# Patient Record
Sex: Female | Born: 1970
Health system: Southern US, Community
[De-identification: ages and names within clinical notes are randomized; demographics above are authoritative.]

## PROBLEM LIST (undated history)

## (undated) DIAGNOSIS — M1990 Unspecified osteoarthritis, unspecified site: Secondary | ICD-10-CM

## (undated) DIAGNOSIS — D219 Benign neoplasm of connective and other soft tissue, unspecified: Secondary | ICD-10-CM

## (undated) DIAGNOSIS — D649 Anemia, unspecified: Secondary | ICD-10-CM

## (undated) DIAGNOSIS — K219 Gastro-esophageal reflux disease without esophagitis: Secondary | ICD-10-CM

## (undated) DIAGNOSIS — R011 Cardiac murmur, unspecified: Secondary | ICD-10-CM

## (undated) HISTORY — DX: Benign neoplasm of connective and other soft tissue, unspecified: D21.9

## (undated) HISTORY — PX: BRONCHOSCOPY: SUR163

## (undated) HISTORY — PX: APPENDECTOMY: SHX54

## (undated) HISTORY — PX: KNEE SURGERY: SHX244

## (undated) HISTORY — DX: Unspecified osteoarthritis, unspecified site: M19.90

## (undated) HISTORY — PX: WISDOM TOOTH EXTRACTION: SHX21

## (undated) HISTORY — DX: Cardiac murmur, unspecified: R01.1

## (undated) HISTORY — PX: OTHER SURGICAL HISTORY: SHX169

## (undated) HISTORY — PX: TUBAL LIGATION: SHX77

## (undated) HISTORY — DX: Anemia, unspecified: D64.9

## (undated) HISTORY — DX: Gastro-esophageal reflux disease without esophagitis: K21.9

---

## 2001-06-22 ENCOUNTER — Encounter: Payer: Self-pay | Admitting: Family Medicine

## 2001-06-22 ENCOUNTER — Encounter: Admission: RE | Admit: 2001-06-22 | Discharge: 2001-06-22 | Payer: Self-pay | Admitting: Family Medicine

## 2001-08-19 ENCOUNTER — Ambulatory Visit (HOSPITAL_COMMUNITY): Admission: RE | Admit: 2001-08-19 | Discharge: 2001-08-19 | Payer: Self-pay | Admitting: Pulmonary Disease

## 2001-08-19 ENCOUNTER — Encounter: Payer: Self-pay | Admitting: Pulmonary Disease

## 2001-08-31 ENCOUNTER — Encounter (INDEPENDENT_AMBULATORY_CARE_PROVIDER_SITE_OTHER): Payer: Self-pay | Admitting: *Deleted

## 2001-08-31 ENCOUNTER — Encounter (INDEPENDENT_AMBULATORY_CARE_PROVIDER_SITE_OTHER): Payer: Self-pay | Admitting: Specialist

## 2001-08-31 ENCOUNTER — Ambulatory Visit: Admission: RE | Admit: 2001-08-31 | Discharge: 2001-08-31 | Payer: Self-pay | Admitting: Pulmonary Disease

## 2001-09-10 ENCOUNTER — Encounter: Payer: Self-pay | Admitting: Thoracic Surgery

## 2001-09-11 ENCOUNTER — Encounter (INDEPENDENT_AMBULATORY_CARE_PROVIDER_SITE_OTHER): Payer: Self-pay | Admitting: *Deleted

## 2001-09-11 ENCOUNTER — Ambulatory Visit (HOSPITAL_COMMUNITY): Admission: RE | Admit: 2001-09-11 | Discharge: 2001-09-11 | Payer: Self-pay | Admitting: Thoracic Surgery

## 2001-09-15 ENCOUNTER — Encounter: Admission: RE | Admit: 2001-09-15 | Discharge: 2001-09-15 | Payer: Self-pay | Admitting: Thoracic Surgery

## 2001-09-15 ENCOUNTER — Encounter: Payer: Self-pay | Admitting: Thoracic Surgery

## 2001-10-02 ENCOUNTER — Encounter: Payer: Self-pay | Admitting: Thoracic Surgery

## 2001-10-02 ENCOUNTER — Ambulatory Visit (HOSPITAL_COMMUNITY): Admission: RE | Admit: 2001-10-02 | Discharge: 2001-10-02 | Payer: Self-pay | Admitting: Thoracic Surgery

## 2001-10-05 ENCOUNTER — Ambulatory Visit: Admission: RE | Admit: 2001-10-05 | Discharge: 2001-10-05 | Payer: Self-pay | Admitting: Pulmonary Disease

## 2001-11-16 ENCOUNTER — Ambulatory Visit (HOSPITAL_COMMUNITY): Admission: RE | Admit: 2001-11-16 | Discharge: 2001-11-16 | Payer: Self-pay | Admitting: Thoracic Surgery

## 2001-11-16 ENCOUNTER — Encounter (INDEPENDENT_AMBULATORY_CARE_PROVIDER_SITE_OTHER): Payer: Self-pay | Admitting: *Deleted

## 2001-11-16 ENCOUNTER — Encounter: Payer: Self-pay | Admitting: Thoracic Surgery

## 2001-11-18 ENCOUNTER — Encounter: Payer: Self-pay | Admitting: Thoracic Surgery

## 2001-11-18 ENCOUNTER — Encounter: Admission: RE | Admit: 2001-11-18 | Discharge: 2001-11-18 | Payer: Self-pay | Admitting: Thoracic Surgery

## 2001-12-08 ENCOUNTER — Encounter: Admission: RE | Admit: 2001-12-08 | Discharge: 2001-12-08 | Payer: Self-pay | Admitting: Thoracic Surgery

## 2001-12-08 ENCOUNTER — Encounter: Payer: Self-pay | Admitting: Thoracic Surgery

## 2001-12-18 ENCOUNTER — Encounter: Payer: Self-pay | Admitting: Thoracic Surgery

## 2001-12-21 ENCOUNTER — Encounter (INDEPENDENT_AMBULATORY_CARE_PROVIDER_SITE_OTHER): Payer: Self-pay | Admitting: *Deleted

## 2001-12-21 ENCOUNTER — Ambulatory Visit (HOSPITAL_COMMUNITY): Admission: RE | Admit: 2001-12-21 | Discharge: 2001-12-21 | Payer: Self-pay | Admitting: Thoracic Surgery

## 2001-12-23 ENCOUNTER — Encounter: Payer: Self-pay | Admitting: Thoracic Surgery

## 2001-12-23 ENCOUNTER — Encounter: Admission: RE | Admit: 2001-12-23 | Discharge: 2001-12-23 | Payer: Self-pay | Admitting: Thoracic Surgery

## 2002-12-22 ENCOUNTER — Other Ambulatory Visit: Admission: RE | Admit: 2002-12-22 | Discharge: 2002-12-22 | Payer: Self-pay | Admitting: Internal Medicine

## 2003-08-25 ENCOUNTER — Encounter: Payer: Self-pay | Admitting: Obstetrics and Gynecology

## 2003-08-25 ENCOUNTER — Ambulatory Visit (HOSPITAL_COMMUNITY): Admission: RE | Admit: 2003-08-25 | Discharge: 2003-08-25 | Payer: Self-pay | Admitting: Obstetrics and Gynecology

## 2003-08-29 ENCOUNTER — Encounter: Payer: Self-pay | Admitting: Obstetrics and Gynecology

## 2003-08-29 ENCOUNTER — Ambulatory Visit (HOSPITAL_COMMUNITY): Admission: RE | Admit: 2003-08-29 | Discharge: 2003-08-29 | Payer: Self-pay | Admitting: Obstetrics and Gynecology

## 2003-09-19 ENCOUNTER — Encounter: Payer: Self-pay | Admitting: Obstetrics & Gynecology

## 2003-09-19 ENCOUNTER — Inpatient Hospital Stay (HOSPITAL_COMMUNITY): Admission: AD | Admit: 2003-09-19 | Discharge: 2003-09-19 | Payer: Self-pay | Admitting: Obstetrics & Gynecology

## 2003-09-22 ENCOUNTER — Ambulatory Visit (HOSPITAL_COMMUNITY): Admission: RE | Admit: 2003-09-22 | Discharge: 2003-09-22 | Payer: Self-pay | Admitting: Obstetrics and Gynecology

## 2003-09-22 ENCOUNTER — Encounter: Payer: Self-pay | Admitting: Obstetrics and Gynecology

## 2003-09-30 ENCOUNTER — Inpatient Hospital Stay (HOSPITAL_COMMUNITY): Admission: RE | Admit: 2003-09-30 | Discharge: 2003-10-03 | Payer: Self-pay | Admitting: Obstetrics and Gynecology

## 2003-09-30 ENCOUNTER — Encounter (INDEPENDENT_AMBULATORY_CARE_PROVIDER_SITE_OTHER): Payer: Self-pay

## 2003-12-27 ENCOUNTER — Other Ambulatory Visit: Admission: RE | Admit: 2003-12-27 | Discharge: 2003-12-27 | Payer: Self-pay | Admitting: Obstetrics and Gynecology

## 2005-01-24 ENCOUNTER — Other Ambulatory Visit: Admission: RE | Admit: 2005-01-24 | Discharge: 2005-01-24 | Payer: Self-pay | Admitting: Obstetrics and Gynecology

## 2005-08-02 ENCOUNTER — Ambulatory Visit: Payer: Self-pay | Admitting: Pulmonary Disease

## 2006-01-29 ENCOUNTER — Other Ambulatory Visit: Admission: RE | Admit: 2006-01-29 | Discharge: 2006-01-29 | Payer: Self-pay | Admitting: Obstetrics and Gynecology

## 2006-07-01 ENCOUNTER — Ambulatory Visit (HOSPITAL_COMMUNITY): Admission: RE | Admit: 2006-07-01 | Discharge: 2006-07-01 | Payer: Self-pay | Admitting: Obstetrics and Gynecology

## 2006-08-04 ENCOUNTER — Ambulatory Visit (HOSPITAL_COMMUNITY): Admission: RE | Admit: 2006-08-04 | Discharge: 2006-08-04 | Payer: Self-pay | Admitting: Obstetrics and Gynecology

## 2006-08-04 ENCOUNTER — Encounter (INDEPENDENT_AMBULATORY_CARE_PROVIDER_SITE_OTHER): Payer: Self-pay | Admitting: *Deleted

## 2006-09-30 ENCOUNTER — Ambulatory Visit (HOSPITAL_COMMUNITY): Admission: RE | Admit: 2006-09-30 | Discharge: 2006-09-30 | Payer: Self-pay | Admitting: Obstetrics and Gynecology

## 2007-05-22 ENCOUNTER — Ambulatory Visit: Payer: Self-pay | Admitting: Pulmonary Disease

## 2007-06-19 ENCOUNTER — Ambulatory Visit: Payer: Self-pay | Admitting: Pulmonary Disease

## 2007-07-10 ENCOUNTER — Ambulatory Visit: Payer: Self-pay | Admitting: Pulmonary Disease

## 2007-07-13 ENCOUNTER — Ambulatory Visit: Payer: Self-pay | Admitting: Pulmonary Disease

## 2007-10-16 DIAGNOSIS — J209 Acute bronchitis, unspecified: Secondary | ICD-10-CM | POA: Insufficient documentation

## 2007-10-16 DIAGNOSIS — J4540 Moderate persistent asthma, uncomplicated: Secondary | ICD-10-CM | POA: Insufficient documentation

## 2008-09-05 ENCOUNTER — Ambulatory Visit: Payer: Self-pay | Admitting: Internal Medicine

## 2008-09-05 LAB — CONVERTED CEMR LAB
ALT: 14 units/L (ref 0–35)
AST: 25 units/L (ref 0–37)
Albumin: 3.2 g/dL — ABNORMAL LOW (ref 3.5–5.2)
Alkaline Phosphatase: 67 units/L (ref 39–117)
BUN: 12 mg/dL (ref 6–23)
Basophils Absolute: 0 10*3/uL (ref 0.0–0.1)
Basophils Relative: 0 % (ref 0.0–3.0)
Bilirubin Urine: NEGATIVE
Bilirubin, Direct: 0.3 mg/dL (ref 0.0–0.3)
CO2: 29 meq/L (ref 19–32)
Calcium: 8.6 mg/dL (ref 8.4–10.5)
Chloride: 112 meq/L (ref 96–112)
Cholesterol: 121 mg/dL (ref 0–200)
Creatinine, Ser: 0.7 mg/dL (ref 0.4–1.2)
Eosinophils Absolute: 0.1 10*3/uL (ref 0.0–0.7)
Eosinophils Relative: 1.9 % (ref 0.0–5.0)
GFR calc Af Amer: 121 mL/min
GFR calc non Af Amer: 100 mL/min
Glucose, Bld: 94 mg/dL (ref 70–99)
HCT: 25.9 % — ABNORMAL LOW (ref 36.0–46.0)
HDL: 26.8 mg/dL — ABNORMAL LOW (ref >39.0)
Hemoglobin, Urine: NEGATIVE
Hemoglobin: 8 g/dL — ABNORMAL LOW (ref 12.0–15.0)
Ketones, ur: NEGATIVE mg/dL
LDL Cholesterol: 54 mg/dL (ref 0–99)
Leukocytes, UA: NEGATIVE
Lymphocytes Relative: 35.8 % (ref 12.0–46.0)
MCHC: 30.8 g/dL (ref 30.0–36.0)
MCV: 65.8 fL — ABNORMAL LOW (ref 78.0–100.0)
Monocytes Absolute: 1.1 10*3/uL — ABNORMAL HIGH (ref 0.1–1.0)
Monocytes Relative: 14.2 % — ABNORMAL HIGH (ref 3.0–12.0)
Neutro Abs: 3.7 10*3/uL (ref 1.4–7.7)
Neutrophils Relative %: 48.1 % (ref 43.0–77.0)
Nitrite: NEGATIVE
Platelets: 557 10*3/uL — ABNORMAL HIGH (ref 150–400)
Potassium: 5 meq/L (ref 3.5–5.1)
RBC: 3.94 M/uL (ref 3.87–5.11)
RDW: 19.7 % — ABNORMAL HIGH (ref 11.5–14.6)
Sodium: 143 meq/L (ref 135–145)
Specific Gravity, Urine: 1.015 (ref 1.000–1.03)
TSH: 0.98 microintl units/mL (ref 0.35–5.50)
Total Bilirubin: 0.7 mg/dL (ref 0.3–1.2)
Total CHOL/HDL Ratio: 4.5
Total Protein, Urine: NEGATIVE mg/dL
Total Protein: 6.9 g/dL (ref 6.0–8.3)
Triglycerides: 200 mg/dL — ABNORMAL HIGH (ref 0–149)
Urine Glucose: NEGATIVE mg/dL
Urobilinogen, UA: 0.2 (ref 0.0–1.0)
VLDL: 40 mg/dL (ref 0–40)
WBC: 7.6 10*3/uL (ref 4.5–10.5)
pH: 6.5 (ref 5.0–8.0)

## 2008-09-08 ENCOUNTER — Encounter: Payer: Self-pay | Admitting: Internal Medicine

## 2008-09-08 ENCOUNTER — Ambulatory Visit: Payer: Self-pay | Admitting: Internal Medicine

## 2008-09-08 DIAGNOSIS — M25569 Pain in unspecified knee: Secondary | ICD-10-CM | POA: Insufficient documentation

## 2008-09-08 DIAGNOSIS — D509 Iron deficiency anemia, unspecified: Secondary | ICD-10-CM | POA: Insufficient documentation

## 2008-09-08 DIAGNOSIS — K219 Gastro-esophageal reflux disease without esophagitis: Secondary | ICD-10-CM | POA: Insufficient documentation

## 2008-09-08 DIAGNOSIS — R011 Cardiac murmur, unspecified: Secondary | ICD-10-CM | POA: Insufficient documentation

## 2008-09-08 DIAGNOSIS — D259 Leiomyoma of uterus, unspecified: Secondary | ICD-10-CM | POA: Insufficient documentation

## 2008-09-08 DIAGNOSIS — D500 Iron deficiency anemia secondary to blood loss (chronic): Secondary | ICD-10-CM | POA: Insufficient documentation

## 2008-09-13 ENCOUNTER — Telehealth: Payer: Self-pay | Admitting: Internal Medicine

## 2008-09-13 ENCOUNTER — Ambulatory Visit: Payer: Self-pay | Admitting: Internal Medicine

## 2008-09-14 ENCOUNTER — Encounter: Admission: RE | Admit: 2008-09-14 | Discharge: 2008-10-24 | Payer: Self-pay | Admitting: Internal Medicine

## 2008-09-21 ENCOUNTER — Telehealth: Payer: Self-pay | Admitting: Internal Medicine

## 2008-10-13 ENCOUNTER — Ambulatory Visit: Payer: Self-pay | Admitting: Internal Medicine

## 2008-10-13 LAB — CONVERTED CEMR LAB
Basophils Absolute: 0 10*3/uL (ref 0.0–0.1)
Basophils Relative: 0.2 % (ref 0.0–3.0)
Eosinophils Absolute: 0.2 10*3/uL (ref 0.0–0.7)
Eosinophils Relative: 2.1 % (ref 0.0–5.0)
Folate: 11.6 ng/mL
HCT: 30 % — ABNORMAL LOW (ref 36.0–46.0)
Hemoglobin: 9.8 g/dL — ABNORMAL LOW (ref 12.0–15.0)
Iron: 21 ug/dL — ABNORMAL LOW (ref 42–145)
Lymphocytes Relative: 32.4 % (ref 12.0–46.0)
MCHC: 32.6 g/dL (ref 30.0–36.0)
MCV: 68.6 fL — ABNORMAL LOW (ref 78.0–100.0)
Monocytes Absolute: 0.5 10*3/uL (ref 0.1–1.0)
Monocytes Relative: 5 % (ref 3.0–12.0)
Neutro Abs: 5.9 10*3/uL (ref 1.4–7.7)
Neutrophils Relative %: 60.3 % (ref 43.0–77.0)
Platelets: 424 10*3/uL — ABNORMAL HIGH (ref 150–400)
RBC: 4.38 M/uL (ref 3.87–5.11)
RDW: 23.4 % — ABNORMAL HIGH (ref 11.5–14.6)
Vitamin B-12: 141 pg/mL — ABNORMAL LOW (ref 211–911)
WBC: 9.7 10*3/uL (ref 4.5–10.5)

## 2008-10-14 ENCOUNTER — Encounter: Payer: Self-pay | Admitting: Internal Medicine

## 2008-10-26 ENCOUNTER — Ambulatory Visit: Payer: Self-pay | Admitting: Internal Medicine

## 2008-10-26 DIAGNOSIS — D510 Vitamin B12 deficiency anemia due to intrinsic factor deficiency: Secondary | ICD-10-CM | POA: Insufficient documentation

## 2008-10-26 DIAGNOSIS — H6590 Unspecified nonsuppurative otitis media, unspecified ear: Secondary | ICD-10-CM | POA: Insufficient documentation

## 2009-02-23 ENCOUNTER — Ambulatory Visit: Payer: Self-pay | Admitting: Internal Medicine

## 2009-02-23 DIAGNOSIS — M722 Plantar fascial fibromatosis: Secondary | ICD-10-CM | POA: Insufficient documentation

## 2009-03-15 ENCOUNTER — Telehealth: Payer: Self-pay | Admitting: Internal Medicine

## 2009-04-03 ENCOUNTER — Telehealth: Payer: Self-pay | Admitting: Internal Medicine

## 2009-05-16 ENCOUNTER — Telehealth: Payer: Self-pay | Admitting: Internal Medicine

## 2009-08-11 ENCOUNTER — Telehealth: Payer: Self-pay | Admitting: Internal Medicine

## 2009-08-15 ENCOUNTER — Telehealth: Payer: Self-pay | Admitting: Internal Medicine

## 2009-09-14 ENCOUNTER — Ambulatory Visit: Payer: Self-pay | Admitting: Internal Medicine

## 2009-09-14 DIAGNOSIS — H918X9 Other specified hearing loss, unspecified ear: Secondary | ICD-10-CM | POA: Insufficient documentation

## 2009-09-18 ENCOUNTER — Encounter: Payer: Self-pay | Admitting: Internal Medicine

## 2009-09-18 ENCOUNTER — Telehealth: Payer: Self-pay | Admitting: Internal Medicine

## 2009-09-25 ENCOUNTER — Telehealth: Payer: Self-pay | Admitting: Internal Medicine

## 2009-10-12 ENCOUNTER — Telehealth: Payer: Self-pay | Admitting: Internal Medicine

## 2010-01-24 LAB — CONVERTED CEMR LAB: Pap Smear: NORMAL

## 2010-02-06 ENCOUNTER — Ambulatory Visit (HOSPITAL_COMMUNITY): Admission: RE | Admit: 2010-02-06 | Discharge: 2010-02-06 | Payer: Self-pay | Admitting: Obstetrics and Gynecology

## 2011-01-04 ENCOUNTER — Encounter: Payer: Self-pay | Admitting: Internal Medicine

## 2011-01-04 ENCOUNTER — Other Ambulatory Visit: Payer: Self-pay | Admitting: Internal Medicine

## 2011-01-04 ENCOUNTER — Telehealth: Payer: Self-pay | Admitting: Internal Medicine

## 2011-01-04 ENCOUNTER — Ambulatory Visit
Admission: RE | Admit: 2011-01-04 | Discharge: 2011-01-04 | Payer: Self-pay | Source: Home / Self Care | Attending: Internal Medicine | Admitting: Internal Medicine

## 2011-01-04 DIAGNOSIS — E538 Deficiency of other specified B group vitamins: Secondary | ICD-10-CM | POA: Insufficient documentation

## 2011-01-04 LAB — LIPID PANEL
Cholesterol: 156 mg/dL (ref 0–200)
HDL: 36.4 mg/dL — ABNORMAL LOW (ref >39.00)
LDL Cholesterol: 109 mg/dL — ABNORMAL HIGH (ref 0–99)
Total CHOL/HDL Ratio: 4
Triglycerides: 51 mg/dL (ref 0.0–149.0)
VLDL: 10.2 mg/dL (ref 0.0–40.0)

## 2011-01-04 LAB — BASIC METABOLIC PANEL
BUN: 12 mg/dL (ref 6–23)
CO2: 29 mEq/L (ref 19–32)
Calcium: 9.3 mg/dL (ref 8.4–10.5)
Chloride: 103 mEq/L (ref 96–112)
Creatinine, Ser: 0.6 mg/dL (ref 0.4–1.2)
GFR: 137.18 mL/min (ref >60.00)
Glucose, Bld: 92 mg/dL (ref 70–99)
Potassium: 4.7 mEq/L (ref 3.5–5.1)
Sodium: 138 mEq/L (ref 135–145)

## 2011-01-04 LAB — IBC PANEL
Iron: 41 ug/dL — ABNORMAL LOW (ref 42–145)
Saturation Ratios: 12.2 % — ABNORMAL LOW (ref 20.0–50.0)
Transferrin: 240.2 mg/dL (ref 212.0–360.0)

## 2011-01-04 LAB — CBC WITH DIFFERENTIAL/PLATELET
Basophils Absolute: 0 10*3/uL (ref 0.0–0.1)
Basophils Relative: 0.5 % (ref 0.0–3.0)
Eosinophils Absolute: 0.1 10*3/uL (ref 0.0–0.7)
Eosinophils Relative: 1.1 % (ref 0.0–5.0)
HCT: 34.5 % — ABNORMAL LOW (ref 36.0–46.0)
Hemoglobin: 11.4 g/dL — ABNORMAL LOW (ref 12.0–15.0)
Lymphocytes Relative: 26.6 % (ref 12.0–46.0)
Lymphs Abs: 2.3 10*3/uL (ref 0.7–4.0)
MCHC: 33 g/dL (ref 30.0–36.0)
MCV: 83.8 fl (ref 78.0–100.0)
Monocytes Absolute: 0.4 10*3/uL (ref 0.1–1.0)
Monocytes Relative: 4.2 % (ref 3.0–12.0)
Neutro Abs: 5.9 10*3/uL (ref 1.4–7.7)
Neutrophils Relative %: 67.6 % (ref 43.0–77.0)
Platelets: 461 10*3/uL — ABNORMAL HIGH (ref 150.0–400.0)
RBC: 4.11 Mil/uL (ref 3.87–5.11)
RDW: 16.4 % — ABNORMAL HIGH (ref 11.5–14.6)
WBC: 8.7 10*3/uL (ref 4.5–10.5)

## 2011-01-04 LAB — B12 AND FOLATE PANEL
Folate: 14.9 ng/mL (ref >5.9)
Vitamin B-12: 75 pg/mL — ABNORMAL LOW (ref 211–911)

## 2011-01-04 LAB — TSH: TSH: 0.97 u[IU]/mL (ref 0.35–5.50)

## 2011-01-06 LAB — CONVERTED CEMR LAB
Basophils Absolute: 0 10*3/uL (ref 0.0–0.1)
Basophils Relative: 0.2 % (ref 0.0–3.0)
Eosinophils Absolute: 0.1 10*3/uL (ref 0.0–0.7)
Eosinophils Relative: 1.8 % (ref 0.0–5.0)
Folate: 15.3 ng/mL
HCT: 27.3 % — ABNORMAL LOW (ref 36.0–46.0)
Hemoglobin: 8.6 g/dL — ABNORMAL LOW (ref 12.0–15.0)
Iron: 16 ug/dL — ABNORMAL LOW (ref 42–145)
Lymphocytes Relative: 26.2 % (ref 12.0–46.0)
MCHC: 31.4 g/dL (ref 30.0–36.0)
MCV: 64.1 fL — ABNORMAL LOW (ref 78.0–100.0)
Monocytes Absolute: 0.2 10*3/uL (ref 0.1–1.0)
Monocytes Relative: 3.2 % (ref 3.0–12.0)
Neutro Abs: 5.1 10*3/uL (ref 1.4–7.7)
Neutrophils Relative %: 68.6 % (ref 43.0–77.0)
Platelets: 603 10*3/uL — ABNORMAL HIGH (ref 150–400)
RBC: 4.25 M/uL (ref 3.87–5.11)
RDW: 19.3 % — ABNORMAL HIGH (ref 11.5–14.6)
Saturation Ratios: 4.1 % — ABNORMAL LOW (ref 20.0–50.0)
Transferrin: 280.7 mg/dL (ref >=212.0)
Vitamin B-12: 70 pg/mL — ABNORMAL LOW (ref 211–911)
WBC: 7.3 10*3/uL (ref 4.5–10.5)

## 2011-01-07 ENCOUNTER — Telehealth: Payer: Self-pay | Admitting: Internal Medicine

## 2011-01-08 NOTE — Progress Notes (Signed)
Summary: results  Phone Note Call from Patient Call back at [phone]   Caller: Patient Summary of Call: Patient called inquiring about results of hearing test that she had faxed over last week? Initial call taken by: Rock Nephew CMA,  September 25, 2009 1:31 PM  Follow-up for Phone Call        i don't think i have seen it. she should call whomever did the test. Follow-up by: Etta Grandchild MD,  September 25, 2009 1:35 PM  Additional Follow-up for Phone Call Additional follow up Details #1::        Pt did not get hearing test [facility], she said her work did the test and faxed results. She will request that it is refaxed.  Additional Follow-up by: Lamar Sprinkles, CMA,  September 25, 2009 4:55 PM

## 2011-01-08 NOTE — Progress Notes (Signed)
Summary: Labs  Phone Note Call from Patient Call back at Home Phone [phone] Call back at [phone]   Summary of Call: Pt has an apt Nov 4th for f/u does she need labs prior?  Initial call taken by: Lamar Sprinkles,  September 21, 2008 4:21 PM  Follow-up for Phone Call        yes   CBC, Iron, B12, folate Follow-up by: Etta Grandchild MD,  September 22, 2008 8:07 AM  Additional Follow-up for Phone Call Additional follow up Details #1::        Pt informed of labs. Pt c/o right ear feeling stuffy. She recenlty has had a "cold" and was taking a decongestant. Nurse at pt's wk told pt her ear was red. Pt will try a decongestant and call office if any pain or fever.  Additional Follow-up by: Lamar Sprinkles,  September 23, 2008 9:53 AM

## 2011-01-08 NOTE — Progress Notes (Signed)
Summary: PT?  Phone Note Call from Patient   Caller: Patient [phone] Summary of Call: pt called inquiring about results of hearing test. pt informed that results have not been received. Initial call taken by: Margaret Pyle, CMA,  September 18, 2009 2:35 PM

## 2011-01-08 NOTE — Progress Notes (Signed)
Summary: B12 Injection  ---- Converted from flag ---- ---- 09/08/2008 2:18 PM, Etta Grandchild MD wrote: Gail Payne, this patient is anemic from iron deficiency and B12 deficiency. Please call her and ask her to come in soon for a B12 shot. ------------------------------  Phone Note Call from Patient   Summary of Call: Need to call pt Initial call taken by: Lamar Sprinkles,  September 13, 2008 7:58 AM  Follow-up for Phone Call        Pt informed, coming in today Follow-up by: Lamar Sprinkles,  September 13, 2008 11:12 AM

## 2011-01-08 NOTE — Assessment & Plan Note (Signed)
Summary: HEARING DIFFICULTY IN R EAR--L ARM NUMBNESS--#--STC---RS'D, 3...   Vital Signs:  Patient profile:   39 year old female Height:      69 inches Weight:      262 pounds BMI:     38.83 O2 Sat:      97 % on Room air Temp:     98.7 degrees F oral Pulse rate:   76 / minute Pulse rhythm:   regular BP sitting:   118 / 74  (left arm) Cuff size:   large  Vitals Entered By: Rock Nephew CMA (September 14, 2009 2:55 PM)  Nutrition Counseling: Patient's BMI is greater than 25 and therefore counseled on weight management options.  O2 Flow:  Room air  Primary Care Provider:  Etta Grandchild MD   History of Present Illness: She returns complaining that she has gradually noticed  a decreased level of hearing in her rght ear for several months.  Also, she ran out of of Adviar Diskus several months ago and has been using her ProAir inhaler up to 5 times per day.  Asthma History    Initial Asthma Severity Rating:    Age range: 12+ years    Symptoms: daily    Nighttime Awakenings: 0-2/month    Interferes w/ normal activity: no limitations    SABA use (not for EIB): 0-2 days/week    Asthma Severity Assessment: Moderate Persistent    Preventive Screening-Counseling & Management  Alcohol-Tobacco     Alcohol drinks/day: <1     Smoking Status: never     Passive Smoke Exposure: no  Current Medications (verified): 1)  Ferrous Sulfate 325 (65 Fe) Mg Tabs (Ferrous Sulfate) .... Three Times A Day With Food 2)  Naprosyn 375 Mg Tabs (Naproxen) .... One By Mouth Two Times A Day As Needed For Foot Pain 3)  Proair Hfa 108 (90 Base) Mcg/act Aers (Albuterol Sulfate) .Marland Kitchen.. 1-2 Puffs Qid As Needed Wheezing 4)  Advair Diskus 250-50 Mcg/dose Misc (Fluticasone-Salmeterol) .... One Puff Two Times A Day For Asthma  Allergies (verified): No Known Drug Allergies  Past History:  Past Medical History: Reviewed history from 09/08/2008 and no changes required. Anemia-iron deficiency Asthma GERD  heart murmur discovered during labor and delivery 5 years ago Uterine fibroids History of mucous plugs in her lungs requiring bronchoscopy  Past Surgical History: Reviewed history from 09/08/2008 and no changes required. Appendectomy Tubal ligation  Family History: Reviewed history from 09/08/2008 and no changes required. Family History of Arthritis Family History Diabetes 1st degree relative Family History High cholesterol Family History Hypertension  Social History: Reviewed history from 09/08/2008 and no changes required. Married Never Smoked Alcohol use-no Drug use-no Regular exercise-no  Review of Systems  The patient denies anorexia, fever, chest pain, dyspnea on exertion, peripheral edema, prolonged cough, abdominal pain, difficulty walking, depression, abnormal bleeding, and enlarged lymph nodes.   General:  Denies chills, fever, loss of appetite, and malaise. ENT:  Complains of decreased hearing; denies difficulty swallowing, ear discharge, earache, hoarseness, nasal congestion, nosebleeds, postnasal drainage, ringing in ears, sinus pressure, and sore throat. Resp:  Complains of shortness of breath and wheezing; denies chest pain with inspiration, cough, coughing up blood, pleuritic, and sputum productive.  Physical Exam  General:  alert, well-developed, well-nourished, well-hydrated, healthy-appearing, cooperative to examination, good hygiene, and overweight-appearing.   Head:  normocephalic and atraumatic.   Ears:  R ear normal and L ear normal.   Nose:  External nasal examination shows no deformity or inflammation.  Nasal mucosa are pink and moist without lesions or exudates. Mouth:  Oral mucosa and oropharynx without lesions or exudates.  Teeth in good repair. Neck:  supple, full ROM, no masses, no carotid bruits, no cervical lymphadenopathy, and no neck tenderness.   Lungs:  Normal respiratory effort, chest expands symmetrically. Lungs are clear to auscultation,  no crackles or wheezes. Heart:  Normal rate and regular rhythm. S1 and S2 normal without gallop, murmur, click, rub or other extra sounds. Abdomen:  soft, non-tender, normal bowel sounds, no distention, no masses, no guarding, no hepatomegaly, and no splenomegaly.   Msk:  normal ROM, no joint tenderness, no joint swelling, no joint warmth, and no redness over joints.   Pulses:  R and L carotid,radial,femoral,dorsalis pedis and posterior tibial pulses are full and equal bilaterally Extremities:  No clubbing, cyanosis, edema, or deformity noted with normal full range of motion of all joints.   Neurologic:  No cranial nerve deficits noted. Station and gait are normal. Plantar reflexes are down-going bilaterally. DTRs are symmetrical throughout. Sensory, motor and coordinative functions appear intact. Skin:  turgor normal, color normal, no rashes, no suspicious lesions, and no ecchymoses.   Cervical Nodes:  No lymphadenopathy noted Psych:  Cognition and judgment appear intact. Alert and cooperative with normal attention span and concentration. No apparent delusions, illusions, hallucinations   Impression & Recommendations:  Problem # 1:  OTHER SPECIFIED FORMS OF HEARING LOSS (ICD-389.8) Assessment New  Orders: Audiology (Audio)  Problem # 2:  ASTHMA (ICD-493.90) Assessment: Deteriorated  The following medications were removed from the medication list:    Advair Diskus 250-50 Mcg/dose Misc (Fluticasone-salmeterol) ..... One puff two times a day for asthma Her updated medication list for this problem includes:    Proair Hfa 108 (90 Base) Mcg/act Aers (Albuterol sulfate) .Marland Kitchen... 1-2 puffs qid as needed wheezing    Qvar 40 Mcg/act Aers (Beclomethasone dipropionate) ..... One puff bid  Complete Medication List: 1)  Ferrous Sulfate 325 (65 Fe) Mg Tabs (Ferrous sulfate) .... Three times a day with food 2)  Naprosyn 375 Mg Tabs (Naproxen) .... One by mouth two times a day as needed for foot pain 3)   Proair Hfa 108 (90 Base) Mcg/act Aers (Albuterol sulfate) .Marland Kitchen.. 1-2 puffs qid as needed wheezing 4)  Qvar 40 Mcg/act Aers (Beclomethasone dipropionate) .... One puff bid  Patient Instructions: 1)  It is important to use your inhaler properly. Use a spacer, take slow deep breaths and hold them. Rinse your mouth after using.  2)  Measure your Peak Flow regularly. If it is below: call our office. Prescriptions: QVAR 40 MCG/ACT AERS (BECLOMETHASONE DIPROPIONATE) One puff BID  #6 inhs x 0   Entered and Authorized by:   Etta Grandchild MD   Signed by:   Etta Grandchild MD on 09/14/2009   Method used:   Samples Given   RxID:   [id number]

## 2011-01-08 NOTE — Assessment & Plan Note (Signed)
Summary: FU---MAR APPT PER PT--$50--STC   Vital Signs:  Patient profile:   40 year old female Height:      69 inches Weight:      263 pounds O2 Sat:      95 % Pulse rate:   80 / minute Pulse rhythm:   regular BP sitting:   118 / 64  (right arm) Cuff size:   large  Vitals Entered By: Rock Nephew CMA (February 23, 2009 3:42 PM) Pain Assessment Patient in pain? yes     Location: Arm and Heel   Primary Care Provider:  Etta Grandchild MD   History of Present Illness: Pt returns c/o pain in both heels for 3 months, no trauma, left>right. Hurrts more in AM and when she walks around barefooted.  She saw her Gyn. last week and B12 was normal but Iron-deficiency/anemia persists and gyn. is looking at fibroids and heavy periods.  Preventive Screening-Counseling & Management     Alcohol drinks/day: <1  Current Medications (verified): 1)  Ferrous Sulfate 325 (65 Fe) Mg Tabs (Ferrous Sulfate) .... Three Times A Day With Food  Allergies (verified): No Known Drug Allergies  Past History:  Family History:    Family History of Arthritis    Family History Diabetes 1st degree relative    Family History High cholesterol    Family History Hypertension     (09/08/2008)  Risk Factors:    Alcohol Use: N/A    >5 drinks/d w/in last 3 months: N/A    Caffeine Use: N/A    Diet: N/A    Exercise: no (09/08/2008)  Past medical, surgical, family and social histories (including risk factors) reviewed, and no changes noted (except as noted below).  Past Medical History:    Reviewed history from 09/08/2008 and no changes required:    Anemia-iron deficiency    Asthma    GERD    heart murmur discovered during labor and delivery 5 years ago    Uterine fibroids    History of mucous plugs in her lungs requiring bronchoscopy  Past Surgical History:    Reviewed history from 09/08/2008 and no changes required:    Appendectomy    Tubal ligation  Family History:    Reviewed history from  09/08/2008 and no changes required:       Family History of Arthritis       Family History Diabetes 1st degree relative       Family History High cholesterol       Family History Hypertension  Social History:    Reviewed history from 09/08/2008 and no changes required:       Married       Never Smoked       Alcohol use-no       Drug use-no       Regular exercise-no  Review of Systems       The patient complains of weight gain.  The patient denies anorexia, fever, weight loss, hemoptysis, abdominal pain, melena, hematochezia, severe indigestion/heartburn, hematuria, abnormal bleeding, enlarged lymph nodes, and angioedema.    Physical Exam  General:  alert, well-developed, well-nourished, well-hydrated, and overweight-appearing.   Neck:  supple, full ROM, and no masses.   Lungs:  Normal respiratory effort, chest expands symmetrically. Lungs are clear to auscultation, no crackles or wheezes. Heart:  Normal rate and regular rhythm. S1 and S2 normal without gallop, murmur, click, rub or other extra sounds. Abdomen:  soft and non-tender.     Foot/Ankle Exam  General:    obese.    Gait:    Normal heel-toe gait pattern bilaterally.    Skin:    Intact with no scars, lesions, rashes, cafe-au-lait spots or bruising.    Inspection:    Inspection reveals no deformity, ecchymosis or swelling.   Palpation:    non-tender to palpation over distal leg, medial and lateral ankle, distal achilles tendon, medial and lateral hindfoot, posterior heel, plantar heel, midfoot and forefoot bilaterally.   Vascular:    dorsalis pedis and posterior tibial pulses 2+ and symmetric, capillary refill < 2 seconds, normal hair pattern, no evidence of ischemia.   Foot Exam:    Right:    Inspection:  Normal    Palpation:  Normal    Stability:  stable    Tenderness:  no    Swelling:  no    Erythema:  no    Range of Motion:       Hallux MTP Dorsiflex-Active: 60       Hallux MTP Plantar Flex-Active:  45       Hallux IP-Active: full       Hallux MTP Dorsiflex-Passive: 60       Hallux MTP Plantar Flex-Passive: 45       Hallux IP-Passive: full    Left:    Inspection:  Normal    Palpation:  Normal    Stability:  stable    Tenderness:  no    Swelling:  no    Erythema:  no    Range of Motion:       Hallux MTP Dorsiflex-Active: 60       Hallux MTP Plantar Flex-Active: 45       Hallux IP-Active: full       Hallux MTP Dorsiflex-Passive: 60       Hallux MTP Plantar Flex-Passive: 45       Hallux IP-Passive: full   Impression & Recommendations:  Problem # 1:  FASCIITIS, PLANTAR (ICD-728.71)  Her updated medication list for this problem includes:    Naprosyn 375 Mg Tabs (Naproxen) ..... One by mouth two times a day as needed for foot pain  Orders: Physical Therapy Referral (PT)  Problem # 2:  ANEMIA, PERNICIOUS (ICD-281.0) managed by her Gyn MD, continue to f/up there Her updated medication list for this problem includes:    Ferrous Sulfate 325 (65 Fe) Mg Tabs (Ferrous sulfate) .Marland Kitchen... Three times a day with food  Complete Medication List: 1)  Ferrous Sulfate 325 (65 Fe) Mg Tabs (Ferrous sulfate) .... Three times a day with food 2)  Naprosyn 375 Mg Tabs (Naproxen) .... One by mouth two times a day as needed for foot pain  Patient Instructions: 1)  Please schedule a follow-up appointment in 1 month. 2)  It is important that you exercise regularly at least 20 minutes 5 times a week. If you develop chest pain, have severe difficulty breathing, or feel very tired , stop exercising immediately and seek medical attention. 3)  You need to lose weight. Consider a lower calorie diet and regular exercise.  Prescriptions: NAPROSYN 375 MG TABS (NAPROXEN) One by mouth two times a day as needed for foot pain  #50 x 1   Entered and Authorized by:   Etta Grandchild MD   Signed by:   Etta Grandchild MD on 02/23/2009   Method used:   Print then Give to Patient   RxID:   [id number]

## 2011-01-08 NOTE — Therapy (Signed)
Summary: Hearing Test  Hearing Test   Imported By: Sherian Rein 09/26/2009 14:13:22  _____________________________________________________________________  External Attachment:    Type:   Image     Comment:   External Document

## 2011-01-08 NOTE — Assessment & Plan Note (Signed)
Summary: b12 coming in at 3:30 SD  Nurse Visit    Prior Medications: ALBUTEROL 90 MCG/ACT  AERS (ALBUTEROL) Use as directed as needed FERROUS SULFATE 325 (65 FE) MG TABS (FERROUS SULFATE) three times a day with food Current Allergies: No known allergies     Medication Administration  Injection # 1:    Medication: Vit B12 1000 mcg    Diagnosis: ANEMIA-IRON DEFICIENCY (ICD-280.9)    Route: IM    Site: L deltoid    Exp Date: 05/09/2010    Lot #: [id number]    Mfr: [employer] Regent    Patient tolerated injection without complications    Given by: Lamar Sprinkles (September 13, 2008 3:59 PM)  Orders Added: 1)  Vit B12 1000 mcg [J3420] 2)  Admin of Therapeutic Inj  intramuscular or subcutaneous Lepidus.[facility]nam    ]

## 2011-01-08 NOTE — Progress Notes (Signed)
Summary: naproxen  Phone Note Refill Request Message from:  Patient on April 03, 2009 2:11 PM  Refills Requested: Medication #1:  NAPROSYN 375 MG TABS One by mouth two times a day as needed for foot pain [facility]   Method Requested: Fax to [facility] Initial call taken by: Orlan Leavens,  April 03, 2009 2:12 PM      Prescriptions: NAPROSYN 375 MG TABS (NAPROXEN) One by mouth two times a day as needed for foot pain  #90 x 2   Entered by:   Orlan Leavens   Authorized by:   Etta Grandchild MD   Signed by:   Orlan Leavens on 04/03/2009   Method used:   Faxed to ...       [facility]* (mail-order)             ,          Ph: [phone]       Fax: [phone]   RxID:   [id number]

## 2011-01-08 NOTE — Letter (Signed)
Summary: Results Follow-up Letter  [facility]  [address] [place], [place] [address]   Phone: [phone]  Fax: [phone]    09/08/2008  [address] [place], [place]  [address]  Dear Gail Payne,   The following are the results of your recent test(s):  Test     Result     Xray              bone spur left knee cap, normal x-ray of the right   _________________________________________________________  Please call for an appointment as directed _________________________________________________________ _________________________________________________________ _________________________________________________________  Sincerely,  Sanda Linger MD [facility]

## 2011-01-08 NOTE — Progress Notes (Signed)
Summary: HEARING TEST  Phone Note Call from Patient   Summary of Call: Patient is requesting results of hearing test. It is scanned into EMR. She wants to know if Dr can refer her somewhere.  Initial call taken by: Lamar Sprinkles, CMA,  October 12, 2009 2:22 PM  Follow-up for Phone Call        There is an order in EMR by Dr, Corrie Dandy has note that [facility] with contact patient. Follow-up by: Lamar Sprinkles, CMA,  October 12, 2009 3:11 PM

## 2011-01-08 NOTE — Progress Notes (Signed)
Summary: Advair/Abluterol  Phone Note Call from Patient   Summary of Call: 1. Patient requesting refill on Albuterol INH, last filled on 03/16/2010 #1x11. Patient states this refill was initially given (called into pharmacy) by previous doctor Darrol Angel (Pulmonary). Rx sent to [facility]. 2. Patient also requesting refill on Advair INH. Patient is unsure what the dosage is. Medication not found on med. list b/c patient was initally given by previous doctor (see above). Patient wants Rx sent to [facility]. Pls. advise. Initial call taken by: Beola Cord, CMA,  May 16, 2009 1:18 PM  Follow-up for Phone Call        ok Follow-up by: Etta Grandchild MD,  May 17, 2009 7:19 AM  Additional Follow-up for Phone Call Additional follow up Details #1::        What would be the reccom. dosage for the Adviar INH Rx? pls advise. Additional Follow-up by: Beola Cord, CMA,  May 17, 2009 9:54 AM    Additional Follow-up for Phone Call Additional follow up Details #2::    adviar diskus BID Follow-up by: Etta Grandchild MD,  May 17, 2009 9:59 AM  Additional Follow-up for Phone Call Additional follow up Details #3:: Details for Additional Follow-up Action Taken: Please give dosage, thanks.........................Marland KitchenLamar Sprinkles  May 18, 2009 7:49 AM   New/Updated Medications: ADVAIR DISKUS 250-50 MCG/DOSE MISC (FLUTICASONE-SALMETEROL) One puff two times a day for asthma   Prescriptions: ADVAIR DISKUS 250-50 MCG/DOSE MISC (FLUTICASONE-SALMETEROL) One puff two times a day for asthma  #3 inhs x 3   Entered by:   Lamar Sprinkles   Authorized by:   Etta Grandchild MD   Signed by:   Lamar Sprinkles on 05/18/2009   Method used:   Faxed to ...       [facility]* (mail-order)             ,          Ph: [phone]       Fax: [phone]   RxID:   [id number] ADVAIR DISKUS 250-50 MCG/DOSE MISC (FLUTICASONE-SALMETEROL) One puff two times a day for asthma  #3 inhs x 3   Entered and Authorized by:    Etta Grandchild MD   Signed by:   Etta Grandchild MD on 05/18/2009   Method used:   Print then Give to Patient   RxID:   [id number] PROAIR HFA 108 (90 BASE) MCG/ACT AERS (ALBUTEROL SULFATE) 1-2 puffs QID as needed wheezing  #3 x 3   Entered by:   Beola Cord, CMA   Authorized by:   Etta Grandchild MD   Signed by:   Beola Cord, CMA on 05/16/2009   Method used:   Faxed to ...       [facility]* (mail-order)             ,          Ph: [phone]       Fax: [phone]   RxID:   [id number]

## 2011-01-08 NOTE — Progress Notes (Signed)
Summary: Req for rx  Phone Note Call from Patient   Caller: [phone] OK to leave a VM Summary of Call: Patient is requesting rx for antibiotic. She is c/o ear discomfort and congestion. The nurse at her work told her that the ear looks red and swollen. No fever or drainage. She is taking zyrtec D with some relief.  Initial call taken by: Lamar Sprinkles, CMA,  August 15, 2009 8:10 AM  Follow-up for Phone Call        done Follow-up by: Etta Grandchild MD,  August 15, 2009 8:16 AM  Additional Follow-up for Phone Call Additional follow up Details #1::        patient notified Additional Follow-up by: Rock Nephew CMA,  August 15, 2009 11:27 AM    New/Updated Medications: AMOXICILLIN 500 MG CAP (AMOXICILLIN) Take 1 capsule by mouth three times a day X 10 days Prescriptions: AMOXICILLIN 500 MG CAP (AMOXICILLIN) Take 1 capsule by mouth three times a day X 10 days  #30 x 0   Entered and Authorized by:   Etta Grandchild MD   Signed by:   Etta Grandchild MD on 08/15/2009   Method used:   Electronically to        [facility] [address] [phone]* (retail)       [address]       [place], [place]  [address]       Ph: [phone] or [phone]       Fax: [phone]   RxID:   [id number]

## 2011-01-08 NOTE — Progress Notes (Signed)
Summary: RX  Phone Note Call from Patient Call back at Home Phone [phone]   Caller: [phone] Summary of Call: Patient is requesting rx for alb inhaler. Dr who wrote rx previously is no longer w/practice she was going to.  Initial call taken by: Lamar Sprinkles,  March 15, 2009 5:47 PM  Follow-up for Phone Call        done, please send to her pharmacy Follow-up by: Etta Grandchild MD,  March 16, 2009 7:55 AM  Additional Follow-up for Phone Call Additional follow up Details #1::        left mess to call office back w/pharm info Additional Follow-up by: Lamar Sprinkles,  March 16, 2009 4:40 PM    Additional Follow-up for Phone Call Additional follow up Details #2::    Pt informed  Follow-up by: Lamar Sprinkles,  March 16, 2009 6:25 PM  New/Updated Medications: PROAIR HFA 108 (90 BASE) MCG/ACT AERS (ALBUTEROL SULFATE) 1-2 puffs QID as needed wheezing   Prescriptions: PROAIR HFA 108 (90 BASE) MCG/ACT AERS (ALBUTEROL SULFATE) 1-2 puffs QID as needed wheezing  #1 x 11   Entered by:   Lamar Sprinkles   Authorized by:   Etta Grandchild MD   Signed by:   Lamar Sprinkles on 03/16/2009   Method used:   Electronically to        [facility] [address] [phone]* (retail)       [address]       [place], [place]  [address]       Ph: [phone] or [phone]       Fax: [phone]   RxID:   [id number] PROAIR HFA 108 (90 BASE) MCG/ACT AERS (ALBUTEROL SULFATE) 1-2 puffs QID as needed wheezing  #1 inh x 11   Entered and Authorized by:   Etta Grandchild MD   Signed by:   Etta Grandchild MD on 03/16/2009   Method used:   Historical   RxID:   [id number]

## 2011-01-08 NOTE — Assessment & Plan Note (Signed)
Summary: PER PT GO OVER LABS W/B12 INJ ALSO-$50--STC   Vital Signs:  Patient Profile:   40 Years Old Female Height:     69 inches Weight:      258 pounds Temp:     98.2 degrees F oral Pulse rate:   60 / minute Pulse rhythm:   regular BP sitting:   112 / 80  (left arm) Cuff size:   large  Vitals Entered By: Rock Nephew CMA (October 26, 2008 3:42 PM)                 Chief Complaint:  discuss test result and R ear check.  History of Present Illness: Pt. c/o's right ear feeling stopped up, noisy with high-pitched tones, and resolving URI symptoms.  Acute Visit History:      The patient complains of earache.  She denies abdominal pain, chest pain, constipation, cough, diarrhea, eye symptoms, fever, genitourinary symptoms, headache, musculoskeletal symptoms, nasal discharge, nausea, rash, sinus problems, sore throat, and vomiting.        The earache is located on the right side.  There have been 'cold' or URI symptoms associated with the earache.  There is no history of recent antibiotic usage or recurrent otitis media associated with the earache.           Prior Medications Reviewed Using: Patient Recall  Prior Medication List:  ALBUTEROL 90 MCG/ACT  AERS (ALBUTEROL) Use as directed as needed FERROUS SULFATE 325 (65 FE) MG TABS (FERROUS SULFATE) three times a day with food   Updated Prior Medication List: ALBUTEROL 90 MCG/ACT  AERS (ALBUTEROL) Use as directed as needed FERROUS SULFATE 325 (65 FE) MG TABS (FERROUS SULFATE) three times a day with food  Current Allergies (reviewed today): No known allergies   Past Medical History:    Reviewed history from 09/08/2008 and no changes required:       Anemia-iron deficiency       Asthma       GERD       heart murmur discovered during labor and delivery 5 years ago       Uterine fibroids       History of mucous plugs in her lungs requiring bronchoscopy  Past Surgical History:    Reviewed history from 09/08/2008 and no  changes required:       Appendectomy       Tubal ligation   Family History:    Reviewed history from 09/08/2008 and no changes required:       Family History of Arthritis       Family History Diabetes 1st degree relative       Family History High cholesterol       Family History Hypertension  Social History:    Reviewed history from 09/08/2008 and no changes required:       Married       Never Smoked       Alcohol use-no       Drug use-no       Regular exercise-no   Risk Factors: Tobacco use:  never Passive smoke exposure:  no Drug use:  no HIV high-risk behavior:  no Alcohol use:  no Exercise:  no  Family History Risk Factors:    Family History of MI in females < 44 years old:  no    Family History of MI in males < 20 years old:  no   Review of Systems  The patient denies anorexia, fever, weight loss, weight gain,  vision loss, chest pain, syncope, dyspnea on exertion, peripheral edema, prolonged cough, headaches, abdominal pain, hematuria, depression, enlarged lymph nodes, melena, hematochezia, and severe indigestion/heartburn.     Physical Exam  General:     alert, well-developed, well-nourished, well-hydrated, appropriate dress, and overweight-appearing.   Eyes:     no icterus Mouth:     Oral mucosa and oropharynx without lesions or exudates.  Teeth in good repair. Neck:     supple, full ROM, no masses, and no thyromegaly.   Lungs:     Normal respiratory effort, chest expands symmetrically. Lungs are clear to auscultation, no crackles or wheezes. Heart:     Normal rate and regular rhythm. S1 and S2 normal without gallop, murmur, click, rub or other extra sounds. Abdomen:     soft, non-tender, normal bowel sounds, no distention, no masses, no guarding, no hepatomegaly, and no splenomegaly.   Extremities:     No clubbing, cyanosis, edema, or deformity noted with normal full range of motion of all joints.   Skin:     turgor normal, color normal, no rashes, no  suspicious lesions, no ecchymoses, no petechiae, no purpura, no ulcerations, and no edema.   Cervical Nodes:     No lymphadenopathy noted Psych:     Cognition and judgment appear intact. Alert and cooperative with normal attention span and concentration. No apparent delusions, illusions, hallucinations    Impression & Recommendations:  Problem # 1:  NONSUPPRATV OTITIS MEDIA NOT SPEC AS ACUT/CHRON (ICD-381.4) empiric treatment with Amoxicillin  Problem # 2:  ANEMIA-IRON DEFICIENCY (ICD-280.9) Assessment: Unchanged  Her updated medication list for this problem includes:    Ferrous Sulfate 325 (65 Fe) Mg Tabs (Ferrous sulfate) .Marland Kitchen... Three times a day with food   Problem # 3:  ANEMIA, PERNICIOUS (ICD-281.0) Assessment: Unchanged  Her updated medication list for this problem includes:    Ferrous Sulfate 325 (65 Fe) Mg Tabs (Ferrous sulfate) .Marland Kitchen... Three times a day with food  Orders: Admin of Therapeutic Inj  intramuscular or subcutaneous (04540) Vit B12 1000 mcg (J3420)   Complete Medication List: 1)  Albuterol 90 Mcg/act Aers (Albuterol) .... Use as directed as needed 2)  Ferrous Sulfate 325 (65 Fe) Mg Tabs (Ferrous sulfate) .... Three times a day with food 3)  Amoxicillin 500 Mg Cap (Amoxicillin) .... Take 1 capsule by mouth three times a day x 10 days   Patient Instructions: 1)  Please schedule a follow-up appointment in 2 weeks. 2)  It is important that you exercise regularly at least 20 minutes 5 times a week. If you develop chest pain, have severe difficulty breathing, or feel very tired , stop exercising immediately and seek medical attention. 3)  You need to lose weight. Consider a lower calorie diet and regular exercise.  4)  Take your antibiotic as prescribed until ALL of it is gone, but stop if you develop a rash or swelling and contact our office as soon as possible.   Prescriptions: AMOXICILLIN 500 MG CAP (AMOXICILLIN) Take 1 capsule by mouth three times a day X  10 days  #30 x 0   Entered and Authorized by:   Etta Grandchild MD   Signed by:   Etta Grandchild MD on 10/26/2008   Method used:   Print then Give to Patient   RxID:   [id number]  ]  Medication Administration  Injection # 1:    Medication: Vit B12 1000 mcg    Diagnosis: ANEMIA, PERNICIOUS (ICD-281.0)  Route: IM    Site: R deltoid    Exp Date: 07/2009    Lot #: [id number]    Mfr: American Regent    Patient tolerated injection without complications    Given by: Rock Nephew CMA (October 26, 2008 4:13 PM)  Orders Added: 1)  Admin of Therapeutic Inj  intramuscular or subcutaneous [96372] 2)  Vit B12 1000 mcg [J3420] 3)  Est. Patient Level IV [81191]  Appended Document: PER PT GO OVER LABS W/B12 INJ ALSO-$50--STC As billing provider, I have reviewed this document.

## 2011-01-10 NOTE — Assessment & Plan Note (Signed)
Summary: b12 injec/ ok to add per [place]  Nurse Visit   Allergies: No Known Drug Allergies  Medication Administration  Injection # 1:    Medication: Vit B12 1000 mcg    Diagnosis: VITAMIN B12 DEFICIENCY (ICD-266.2)    Route: IM    Site: L deltoid    Exp Date: 10/2012    Lot #: [id number]    Mfr: American Regent    Patient tolerated injection without complications    Given by: Rock Nephew CMA (January 04, 2011 3:14 PM)  Orders Added: 1)  Admin of Therapeutic Inj  intramuscular or subcutaneous [96372] 2)  Vit B12 1000 mcg [J3420]    Medication Administration  Injection # 1:    Medication: Vit B12 1000 mcg    Diagnosis: VITAMIN B12 DEFICIENCY (ICD-266.2)    Route: IM    Site: L deltoid    Exp Date: 10/2012    Lot #: [id number]    Mfr: American Regent    Patient tolerated injection without complications    Given by: Rock Nephew CMA (January 04, 2011 3:14 PM)  Orders Added: 1)  Admin of Therapeutic Inj  intramuscular or subcutaneous [96372] 2)  Vit B12 1000 mcg [J3420]

## 2011-01-10 NOTE — Letter (Signed)
Summary: Lipid Letter  [facility]  [address] [place], [place] [address]   Phone: [phone]  Fax: [phone]    01/04/2011  Gail Payne [address] [place], [place]  [address]  Dear Gail Payne:  We have carefully reviewed your last lipid profile from 01/04/2011 and the results are noted below with a summary of recommendations for lipid management.    Cholesterol:       156     Goal: <200   HDL "good" Cholesterol:   57.84     Goal: >50   LDL "bad" Cholesterol:   109     Goal: <130   Triglycerides:       51.0     Goal: <150    your B12 level is VERY low and iron level is mildly low and you are mildly anemic, please return for a B12 injection soon    TLC Diet (Therapeutic Lifestyle Change): Saturated Fats & Transfatty acids should be kept < 7% of total calories ***Reduce Saturated Fats Polyunstaurated Fat can be up to 10% of total calories Monounsaturated Fat Fat can be up to 20% of total calories Total Fat should be no greater than 25-35% of total calories Carbohydrates should be 50-60% of total calories Protein should be approximately 15% of total calories Fiber should be at least 20-30 grams a day ***Increased fiber may help lower LDL Total Cholesterol should be < 200mg /day Consider adding plant stanol/sterols to diet (example: Benacol spread) ***A higher intake of unsaturated fat may reduce Triglycerides and Increase HDL    Adjunctive Measures (may lower LIPIDS and reduce risk of Heart Attack) include: Aerobic Exercise (20-30 minutes 3-4 times a week) Limit Alcohol Consumption Weight Reduction Aspirin 75-81 mg a day by mouth (if not allergic or contraindicated) Dietary Fiber 20-30 grams a day by mouth     Current Medications: 1)    Ferrous Sulfate 325 (65 Fe) Mg Tabs (Ferrous sulfate) .... Three times a day with food 2)    Naprosyn 375 Mg Tabs (Naproxen) .... One by mouth two times a day as needed for foot pain 3)    Proair Hfa 108 (90 Base)  Mcg/act Aers (Albuterol sulfate) .Marland Kitchen.. 1-2 puffs qid as needed wheezing 4)    Qvar 40 Mcg/act Aers (Beclomethasone dipropionate) .... One puff bid  If you have any questions, please call. We appreciate being able to work with you.   Sincerely,    [facility] Etta Grandchild MD

## 2011-01-10 NOTE — Assessment & Plan Note (Signed)
Summary: cpx-lb   Vital Signs:  Patient profile:   40 year old female Menstrual status:  regular LMP:     12/19/2010 Height:      69 inches Weight:      249 pounds BMI:     36.90 O2 Sat:      98 % on Room air Temp:     98.5 degrees F oral Pulse rate:   73 / minute Pulse rhythm:   regular Resp:     16 per minute BP sitting:   128 / 80  (left arm) Cuff size:   large  Vitals Entered By: Rock Nephew CMA (January 04, 2011 10:23 AM)  Nutrition Counseling: Patient's BMI is greater than 25 and therefore counseled on weight management options.  O2 Flow:  Room air CC: CPx w/labs , Preventive Care, Abdominal Pain Is Patient Diabetic? No Pain Assessment Patient in pain? no       Does patient need assistance? Functional Status Self care Ambulation Normal LMP (date): 12/19/2010 LMP - Character: heavy     Menstrual Status regular Enter LMP: 12/19/2010 Last PAP Result Normal   Primary Care Provider:  Etta Grandchild MD  CC:  CPx w/labs , Preventive Care, and Abdominal Pain.  History of Present Illness:  Follow-Up Visit      This is a 40 year old woman who presents for Follow-up visit.  The patient denies chest pain, palpitations, dizziness, syncope, low blood sugar symptoms, high blood sugar symptoms, edema, SOB, DOE, PND, and orthopnea.  Since the last visit the patient notes no new problems or concerns.    Asthma History    Asthma Control Assessment:    Age range: 12+ years    Symptoms: 0-2 days/week    Nighttime Awakenings: 0-2/month    Interferes w/ normal activity: no limitations    SABA use (not for EIB): 0-2 days/week    Asthma Control Assessment: Well Controlled  Dyspepsia History:      She has no alarm features of dyspepsia including no history of melena, hematochezia, dysphagia, persistent vomiting, or involuntary weight loss > 5%.  There is a prior history of GERD.  The patient does not have a prior history of documented ulcer disease.      Preventive  Screening-Counseling & Management  Alcohol-Tobacco     Alcohol drinks/day: <1     Alcohol type: all     >5/day in last 3 mos: no     Alcohol Counseling: not indicated; use of alcohol is not excessive or problematic     Feels need to cut down: no     Feels annoyed by complaints: no     Feels guilty re: drinking: no     Needs 'eye opener' in am: no     Smoking Status: never     Passive Smoke Exposure: no     Tobacco Counseling: not indicated; no tobacco use  Hep-HIV-STD-Contraception     Hepatitis Risk: no risk noted     HIV Risk: no risk noted     STD Risk: no risk noted      Sexual History:  currently monogamous.        Drug Use:  never.        Blood Transfusions:  no.    Clinical Review Panels:  Prevention   Last Pap Smear:  Normal (01/24/2010)  Immunizations   Last Flu Vaccine:  Fluvax 3+ (10/24/2010)  Lipid Management   Cholesterol:  121 (09/05/2008)   LDL (bad choesterol):  54 (09/05/2008)   HDL (good cholesterol):  26.8 (09/05/2008)  Diabetes Management   Creatinine:  0.7 (09/05/2008)   Last Flu Vaccine:  Fluvax 3+ (10/24/2010)  CBC   WBC:  9.7 (10/13/2008)   RBC:  4.38 (10/13/2008)   Hgb:  9.8 (10/13/2008)   Hct:  30.0 (10/13/2008)   Platelets:  424 (10/13/2008)   MCV  68.6 (10/13/2008)   MCHC  32.6 (10/13/2008)   RDW  23.4 (10/13/2008)   PMN:  60.3 (10/13/2008)   Lymphs:  32.4 (10/13/2008)   Monos:  5.0 (10/13/2008)   Eosinophils:  2.1 (10/13/2008)   Basophil:  0.2 (10/13/2008)  Complete Metabolic Panel   Glucose:  94 (09/05/2008)   Sodium:  143 (09/05/2008)   Potassium:  5.0 (09/05/2008)   Chloride:  112 (09/05/2008)   CO2:  29 (09/05/2008)   BUN:  12 (09/05/2008)   Creatinine:  0.7 (09/05/2008)   Albumin:  3.2 (09/05/2008)   Total Protein:  6.9 (09/05/2008)   Calcium:  8.6 (09/05/2008)   Total Bili:  0.7 (09/05/2008)   Alk Phos:  67 (09/05/2008)   SGPT (ALT):  14 (09/05/2008)   SGOT (AST):  25 (09/05/2008)   Medications Prior to  Update: 1)  Ferrous Sulfate 325 (65 Fe) Mg Tabs (Ferrous Sulfate) .... Three Times A Day With Food 2)  Naprosyn 375 Mg Tabs (Naproxen) .... One By Mouth Two Times A Day As Needed For Foot Pain 3)  Proair Hfa 108 (90 Base) Mcg/act Aers (Albuterol Sulfate) .Marland Kitchen.. 1-2 Puffs Qid As Needed Wheezing 4)  Qvar 40 Mcg/act Aers (Beclomethasone Dipropionate) .... One Puff Bid  Current Medications (verified): 1)  Ferrous Sulfate 325 (65 Fe) Mg Tabs (Ferrous Sulfate) .... Three Times A Day With Food 2)  Naprosyn 375 Mg Tabs (Naproxen) .... One By Mouth Two Times A Day As Needed For Foot Pain 3)  Proair Hfa 108 (90 Base) Mcg/act Aers (Albuterol Sulfate) .Marland Kitchen.. 1-2 Puffs Qid As Needed Wheezing 4)  Qvar 40 Mcg/act Aers (Beclomethasone Dipropionate) .... One Puff Bid  Allergies (verified): No Known Drug Allergies  Past History:  Past Medical History: Last updated: 09/08/2008 Anemia-iron deficiency Asthma GERD heart murmur discovered during labor and delivery 5 years ago Uterine fibroids History of mucous plugs in her lungs requiring bronchoscopy  Past Surgical History: Last updated: 09/08/2008 Appendectomy Tubal ligation  Family History: Last updated: 09/08/2008 Family History of Arthritis Family History Diabetes 1st degree relative Family History High cholesterol Family History Hypertension  Social History: Last updated: 09/08/2008 Married Never Smoked Alcohol use-no Drug use-no Regular exercise-no  Risk Factors: Alcohol Use: <1 (01/04/2011) >5 drinks/d w/in last 3 months: no (01/04/2011) Exercise: no (09/08/2008)  Risk Factors: Smoking Status: never (01/04/2011) Passive Smoke Exposure: no (01/04/2011)  Family History: Reviewed history from 09/08/2008 and no changes required. Family History of Arthritis Family History Diabetes 1st degree relative Family History High cholesterol Family History Hypertension  Social History: Reviewed history from 09/08/2008 and no changes  required. Married Never Smoked Alcohol use-no Drug use-no Regular exercise-no Hepatitis Risk:  no risk noted HIV Risk:  no risk noted STD Risk:  no risk noted Sexual History:  currently monogamous Drug Use:  never Blood Transfusions:  no  Review of Systems  The patient denies anorexia, fever, chest pain, syncope, dyspnea on exertion, peripheral edema, prolonged cough, headaches, hemoptysis, abdominal pain, hematuria, suspicious skin lesions, abnormal bleeding, enlarged lymph nodes, and angioedema.   Heme:  Denies abnormal bruising, bleeding, enlarge lymph nodes, fevers, pallor, and skin discoloration.  Physical Exam  General:  alert, well-developed, well-nourished, well-hydrated, healthy-appearing, cooperative to examination, good hygiene, and overweight-appearing.   Head:  normocephalic and atraumatic.   Eyes:  vision grossly intact, pupils equal, and no injection.   Mouth:  Oral mucosa and oropharynx without lesions or exudates.  Teeth in good repair. Neck:  supple, full ROM, no masses, no carotid bruits, no cervical lymphadenopathy, and no neck tenderness.   Lungs:  Normal respiratory effort, chest expands symmetrically. Lungs are clear to auscultation, no crackles or wheezes. Heart:  Normal rate and regular rhythm. S1 and S2 normal without gallop, murmur, click, rub or other extra sounds. Abdomen:  soft, non-tender, normal bowel sounds, no distention, no masses, no guarding, no hepatomegaly, and no splenomegaly.   Msk:  normal ROM, no joint tenderness, no joint swelling, no joint warmth, and no redness over joints.   Pulses:  R and L carotid,radial,femoral,dorsalis pedis and posterior tibial pulses are full and equal bilaterally Extremities:  No clubbing, cyanosis, edema, or deformity noted with normal full range of motion of all joints.   Neurologic:  No cranial nerve deficits noted. Station and gait are normal. Plantar reflexes are down-going bilaterally. DTRs are symmetrical  throughout. Sensory, motor and coordinative functions appear intact. Skin:  turgor normal, color normal, no rashes, no suspicious lesions, and no ecchymoses.   Cervical Nodes:  No lymphadenopathy noted Psych:  Cognition and judgment appear intact. Alert and cooperative with normal attention span and concentration. No apparent delusions, illusions, hallucinations   Impression & Recommendations:  Problem # 1:  ANEMIA, PERNICIOUS (ICD-281.0) Assessment Unchanged  Her updated medication list for this problem includes:    Ferrous Sulfate 325 (65 Fe) Mg Tabs (Ferrous sulfate) .Marland Kitchen... Three times a day with food  Orders: Venipuncture (52841) TLB-Lipid Panel (80061-LIPID) TLB-IBC Pnl (Iron/FE;Transferrin) (83550-IBC) TLB-B12 + Folate Pnl (32440_10272-Z36/UYQ) TLB-CBC Platelet - w/Differential (85025-CBCD) TLB-BMP (Basic Metabolic Panel-BMET) (80048-METABOL) TLB-TSH (Thyroid Stimulating Hormone) (84443-TSH)  Hgb: 9.8 (10/13/2008)   Hct: 30.0 (10/13/2008)   Platelets: 424 (10/13/2008) RBC: 4.38 (10/13/2008)   RDW: 23.4 (10/13/2008)   WBC: 9.7 (10/13/2008) MCV: 68.6 (10/13/2008)   MCHC: 32.6 (10/13/2008) Iron: 21 (10/13/2008)   % Sat: 4.1 (09/08/2008) B12: 141 (10/13/2008)   Folate: 11.6 (10/13/2008)   TSH: 0.98 (09/05/2008)  Problem # 2:  ANEMIA-IRON DEFICIENCY (ICD-280.9) Assessment: Unchanged  Her updated medication list for this problem includes:    Ferrous Sulfate 325 (65 Fe) Mg Tabs (Ferrous sulfate) .Marland Kitchen... Three times a day with food  Orders: Venipuncture (03474) TLB-Lipid Panel (80061-LIPID) TLB-IBC Pnl (Iron/FE;Transferrin) (83550-IBC) TLB-B12 + Folate Pnl ([id number]-E33/IRJ) TLB-CBC Platelet - w/Differential (85025-CBCD) TLB-BMP (Basic Metabolic Panel-BMET) (80048-METABOL) TLB-TSH (Thyroid Stimulating Hormone) (84443-TSH)  Hgb: 9.8 (10/13/2008)   Hct: 30.0 (10/13/2008)   Platelets: 424 (10/13/2008) RBC: 4.38 (10/13/2008)   RDW: 23.4 (10/13/2008)   WBC: 9.7  (10/13/2008) MCV: 68.6 (10/13/2008)   MCHC: 32.6 (10/13/2008) Iron: 21 (10/13/2008)   % Sat: 4.1 (09/08/2008) B12: 141 (10/13/2008)   Folate: 11.6 (10/13/2008)   TSH: 0.98 (09/05/2008)  Problem # 3:  ROUTINE GENERAL MEDICAL EXAM@HEALTH  CARE FACL (ICD-V70.0) Assessment: Unchanged  Orders: Venipuncture (18841) TLB-Lipid Panel (80061-LIPID) TLB-IBC Pnl (Iron/FE;Transferrin) (83550-IBC) TLB-B12 + Folate Pnl (66063_01601-U93/ATF) TLB-CBC Platelet - w/Differential (85025-CBCD) TLB-BMP (Basic Metabolic Panel-BMET) (80048-METABOL) TLB-TSH (Thyroid Stimulating Hormone) (84443-TSH)  Flu Vax: Fluvax 3+ (10/24/2010)   Chol: 121 (09/05/2008)   HDL: 26.8 (09/05/2008)   LDL: 54 (09/05/2008)   TG: 200 (09/05/2008) TSH: 0.98 (09/05/2008)    Discussed using sunscreen, use of alcohol, drug use,  self breast exam, routine dental care, routine eye care, schedule for GYN exam, routine physical exam, seat belts, multiple vitamins, osteoporosis prevention, adequate calcium intake in diet, recommendations for immunizations, mammograms and Pap smears.  Discussed exercise and checking cholesterol.  Discussed gun safety, safe sex, and contraception.  Problem # 4:  GERD (ICD-530.81) Assessment: Unchanged  Problem # 5:  ASTHMA (ICD-493.90) Assessment: Unchanged  Her updated medication list for this problem includes:    Proair Hfa 108 (90 Base) Mcg/act Aers (Albuterol sulfate) .Marland Kitchen... 1-2 puffs qid as needed wheezing    Qvar 40 Mcg/act Aers (Beclomethasone dipropionate) ..... One puff bid  Pulmonary Functions Reviewed: O2 sat: 98 (01/04/2011)  Complete Medication List: 1)  Ferrous Sulfate 325 (65 Fe) Mg Tabs (Ferrous sulfate) .... Three times a day with food 2)  Naprosyn 375 Mg Tabs (Naproxen) .... One by mouth two times a day as needed for foot pain 3)  Proair Hfa 108 (90 Base) Mcg/act Aers (Albuterol sulfate) .Marland Kitchen.. 1-2 puffs qid as needed wheezing 4)  Qvar 40 Mcg/act Aers (Beclomethasone dipropionate) .... One  puff bid  PAP Screening:    Hx Cervical Dysplasia in last 5 yrs? No    3 normal PAP smears in last 5 yrs? Yes    Last PAP smear:  01/24/2010    Reviewed PAP smear recommendations:  patient defers to GYN provider  PAP Smear Results:    Date of Exam:  01/24/2010    Results:  Normal  Osteoporosis Risk Assessment:  Risk Factors for Fracture or Low Bone Density:   Smoking status:       never  Immunization & Chemoprophylaxis:    Influenza vaccine: Fluvax 3+  (10/24/2010)  Patient Instructions: 1)  Please schedule a follow-up appointment in 4 months. 2)  It is important that you exercise regularly at least 20 minutes 5 times a week. If you develop chest pain, have severe difficulty breathing, or feel very tired , stop exercising immediately and seek medical attention. 3)  You need to lose weight. Consider a lower calorie diet and regular exercise.  4)  You need to have a Pap Smear to prevent cervical cancer. 5)  If you could be exposed to sexually transmitted diseases, you should use a condom. 6)  If you are having sex and you or your partner don't want a child, use contraception.   Orders Added: 1)  Venipuncture [36415] 2)  TLB-Lipid Panel [80061-LIPID] 3)  TLB-IBC Pnl (Iron/FE;Transferrin) [83550-IBC] 4)  TLB-B12 + Folate Pnl [[id number]-B12/FOL] 5)  TLB-CBC Platelet - w/Differential [85025-CBCD] 6)  TLB-BMP (Basic Metabolic Panel-BMET) [80048-METABOL] 7)  TLB-TSH (Thyroid Stimulating Hormone) [84443-TSH] 8)  Est. Patient Level IV [04540]   Immunization History:  Influenza Immunization History:    Influenza:  fluvax 3+ (10/24/2010)   Immunization History:  Influenza Immunization History:    Influenza:  Fluvax 3+ (10/24/2010)

## 2011-01-10 NOTE — Progress Notes (Signed)
  Phone Note Outgoing Call   Summary of Call: LA - her B12 level is severely low, please ask her to come in for a B12 injection asap  TJ Initial call taken by: Etta Grandchild MD,  January 04, 2011 12:54 PM  Follow-up for Phone Call        Patient notified per MD and will come in the afternoon for injection Follow-up by: Rock Nephew CMA,  January 04, 2011 1:41 PM

## 2011-01-16 NOTE — Progress Notes (Signed)
Summary: pt wants advise on dietary fiber.  Phone Note Other Incoming   Caller: pt [phone] Summary of Call: Pt wants your advise on a good dietary fiber suppliment that you would advise. Initial call taken by: Ami Bullins CMA,  January 07, 2011 8:17 AM  Follow-up for Phone Call        fibercon Follow-up by: Etta Grandchild MD,  January 07, 2011 8:33 AM  Additional Follow-up for Phone Call Additional follow up Details #1::        Notified patient of MDs response. Additional Follow-up by: Daphane Shepherd,  January 08, 2011 9:49 AM

## 2011-01-18 ENCOUNTER — Encounter: Payer: Self-pay | Admitting: Internal Medicine

## 2011-01-18 ENCOUNTER — Ambulatory Visit (INDEPENDENT_AMBULATORY_CARE_PROVIDER_SITE_OTHER): Payer: BC Managed Care – PPO

## 2011-01-18 DIAGNOSIS — E538 Deficiency of other specified B group vitamins: Secondary | ICD-10-CM

## 2011-01-24 NOTE — Assessment & Plan Note (Signed)
Summary: PER LAKISHA 2 WK B12 TLJ  STC  Nurse Visit   Allergies: No Known Drug Allergies  Medication Administration  Injection # 1:    Medication: Vit B12 1000 mcg    Diagnosis: VITAMIN B12 DEFICIENCY (ICD-266.2)    Route: IM    Site: R deltoid    Exp Date: 10/2012    Lot #: [id number]    Mfr: American Regent    Patient tolerated injection without complications    Given by: Ami Bullins CMA (January 18, 2011 9:42 AM)  Orders Added: 1)  Admin of Therapeutic Inj  intramuscular or subcutaneous [96372] 2)  Vit B12 1000 mcg [J3420]

## 2011-02-01 ENCOUNTER — Ambulatory Visit (INDEPENDENT_AMBULATORY_CARE_PROVIDER_SITE_OTHER): Payer: BC Managed Care – PPO

## 2011-02-01 ENCOUNTER — Encounter: Payer: Self-pay | Admitting: Internal Medicine

## 2011-02-01 DIAGNOSIS — E538 Deficiency of other specified B group vitamins: Secondary | ICD-10-CM

## 2011-02-05 NOTE — Assessment & Plan Note (Signed)
Summary: PER PT 2 WK B12  TLJ  STC  Nurse Visit   Allergies: No Known Drug Allergies  Medication Administration  Injection # 1:    Medication: Vit B12 1000 mcg    Diagnosis: VITAMIN B12 DEFICIENCY (ICD-266.2)    Route: IM    Site: L deltoid    Exp Date: 10/2012    Lot #: [id number]    Mfr: American Regent    Patient tolerated injection without complications    Given by: Burnard Leigh CMA(AAMA) (February 01, 2011 9:32 AM)  Orders Added: 1)  Vit B12 1000 mcg [J3420] 2)  Admin of Therapeutic Inj  intramuscular or subcutaneous [65784]

## 2011-02-27 LAB — CBC
HCT: 33.4 % — ABNORMAL LOW (ref 36.0–46.0)
Hemoglobin: 10.5 g/dL — ABNORMAL LOW (ref 12.0–15.0)
MCHC: 31.4 g/dL (ref 30.0–36.0)
MCV: 78.2 fL (ref 78.0–100.0)
Platelets: 464 10*3/uL — ABNORMAL HIGH (ref 150–400)
RBC: 4.27 MIL/uL (ref 3.87–5.11)
RDW: 18.3 % — ABNORMAL HIGH (ref 11.5–15.5)
WBC: 8.4 10*3/uL (ref 4.0–10.5)

## 2011-02-27 LAB — URINALYSIS, ROUTINE W REFLEX MICROSCOPIC
Bilirubin Urine: NEGATIVE
Glucose, UA: NEGATIVE mg/dL
Hgb urine dipstick: NEGATIVE
Ketones, ur: NEGATIVE mg/dL
Nitrite: NEGATIVE
Protein, ur: NEGATIVE mg/dL
Specific Gravity, Urine: 1.01 (ref 1.005–1.030)
Urobilinogen, UA: 0.2 mg/dL (ref 0.0–1.0)
pH: 7 (ref 5.0–8.0)

## 2011-02-27 LAB — PREGNANCY, URINE: Preg Test, Ur: NEGATIVE

## 2011-03-01 ENCOUNTER — Ambulatory Visit (INDEPENDENT_AMBULATORY_CARE_PROVIDER_SITE_OTHER): Payer: BC Managed Care – PPO | Admitting: *Deleted

## 2011-03-01 DIAGNOSIS — E538 Deficiency of other specified B group vitamins: Secondary | ICD-10-CM

## 2011-03-01 MED ORDER — CYANOCOBALAMIN 1000 MCG/ML IJ SOLN
1000.0000 ug | Freq: Once | INTRAMUSCULAR | Status: AC
  Administered 2011-03-01: 1000 ug via INTRAMUSCULAR

## 2011-04-23 NOTE — Assessment & Plan Note (Signed)
[facility]                             PULMONARY OFFICE NOTE   NAME:Gail Payne, Gail Payne                     MRN:          [id number]  DATE:07/13/2007                            DOB:          [birth year]    HISTORY OF PRESENT ILLNESS:  Patient is a 40 year old African-American  female patient of Dr. Sung Amabile who has as known history of mild  intermittent asthma with a history of mucus plugging, who presents today  for a three week followup.  Patient recently had a slow-to-resolve  asthmatic bronchitic exacerbation.  She was given a trial of Symbicort  80/4.5 two puffs twice daily and recommended to use Zyrtec at bedtime  and started on AcipHex for suspected underlying reflux.  The patient  returns today reporting that she is much improved.  Cough and congestion  has decreased substantially.  Occasionally has some mild cough earlier  in the morning with some light green sputum; however, clears throughout  the rest of the day with minimal coughing.   Patient did undergo PFTs today with an FEV1 of 2.4 liters with 79% of  predicted without any significant response to bronchodilators.   PHYSICAL EXAMINATION:  Patient is a pleasant female in no acute  distress.  She is afebrile with stable vital signs.  O2 saturation is 100% on room  air.  HEENT:  Unremarkable.  NECK:  Supple without cervical adenopathy.  No JVD.  LUNGS:  The lung sounds reveal diminished breath sounds at the bases  without any wheezing or crackles.  CARDIAC:  Regular rate.  ABDOMEN:  Soft and nontender.  EXTREMITIES:  Warm without any edema.   IMPRESSION/PLAN:  Recent slow-to-resolve asthmatic bronchitic  exacerbation, now much improved.  We will continue on Symbicort 80/4.5  two puffs twice daily, rinsing very well afterwards.  She also is to use  AcipHex until cough slowly resolves or any possible upper airway  stability secondary to reflux.  Patient may change Zyrted to as-needed  basis for any postnasal drip symptoms as well.  Patient will return back  with Dr. Sung Amabile in three months or sooner if needed.     Rubye Oaks, NP  Electronically Signed      Oley Balm. Sung Amabile, MD  Electronically Signed   TP/MedQ  DD: 07/13/2007  DT: 07/14/2007  Job #: [id number]

## 2011-04-23 NOTE — Assessment & Plan Note (Signed)
[facility]                             PULMONARY OFFICE NOTE   NAME:Gail Payne, Gail Payne                     MRN:          [id number]  DATE:06/19/2007                            DOB:          January 06, 1971    HISTORY OF PRESENT ILLNESS:  The patient is a 40 year old African-  American female patient of Dr. Sung Amabile who has a known history of mild,  intermittent asthma with a history of mucous plugging, who presents  today for a 2-week followup.  The patient recently had an asthmatic  bronchitic exacerbation, was given doxycycline x7 days and returns today  for followup.  The patient reports she is improved; however, her cough  does continue with occasional wheezing.  She continues to use her  albuterol 2-3 times a day.  Prior to being seen 2 weeks ago, the patient  reports she has been off of her Advair for over a year, and, on average,  she uses her albuterol about once every 2 weeks.  The patient denies any  hemoptysis, orthopnea, PND or leg swelling.   PAST MEDICAL HISTORY:  Reviewed.   CURRENT MEDICATIONS:  Reviewed.   PHYSICAL EXAMINATION:  The patient is a pleasant female in no acute  distress.  She is afebrile with stable vital signs.  Her O2 saturation is 92% on  room air.  HEENT:  Nasal mucosa is slightly pale.  Nontender sinuses.  Posterior  oropharynx is clear.  NECK:  Supple without cervical adenopathy, no JVD.  Lung sounds reveal a few expiratory wheezes.  The patient does have some  upper airway pseudo wheezing.  CARDIAC:  Regular rate and rhythm.  ABDOMEN:  Soft and nontender.  EXTREMITIES:  Warm without any calf tenderness, cyanosis, clubbing, or  edema.   DATA:  In office spirometry reveals an FEV1 of 2.23l/m which is at 66%  of the predicted.   IMPRESSION/PLAN:  Slow-to-resolve asthmatic bronchitic exacerbation.  The patient's chest x-ray is pending at the time of dictation.  The  patient does have some upper airway instability,  possibly secondary to  rhinitis and/or reflux.  The patient was given a trial of Symbicort  80/4.5 two puffs twice daily.  The patient is to rinse well afterwards.  She will also be started on Zyrtec 10 mg at bedtime, and Aciphex 20 mg  daily for any residual reflux that could be irritating the airways.  She  may use Delsym as needed for cough along with sugarless candy to avoid  throat  clearing.  The patient will follow back up here in 2 weeks with Dr.  Sung Amabile or sooner if needed.  At that time we will have PFTs.      Rubye Oaks, NP  Electronically Signed      Oley Balm. Sung Amabile, MD  Electronically Signed   TP/MedQ  DD: 06/19/2007  DT: 06/20/2007  Job #: [id number]

## 2011-04-26 NOTE — Op Note (Signed)
. [facility]  Patient:    Gail Payne, Gail Payne Visit Number: [id number] MRN: [id number]          Service Type: DSU Location: [facility] [id number] Attending Physician:  Cameron Proud Dictated by:   D. Karle Plumber, M.D. Proc. Date: 09/11/01 Admit Date:  09/11/2001 Discharge Date: 09/11/2001   CC:         Onalee Hua B. Simonds, M.D. [facility]   Operative Report  PREOPERATIVE DIAGNOSIS: Bilateral pulmonary infiltrates with right lower lobe atelectasis.  POSTOPERATIVE DIAGNOSIS: Bilateral pulmonary infiltrates with right lower lobe atelectasis, secondary to bronchial debris.  OPERATION/PROCEDURE: Fiberoptic bronchoscopy.  SURGEON: D. Karle Plumber, M.D.  ANESTHESIA: General.  INDICATIONS FOR PROCEDURE: This patient had a previous bronchoscopy and was found to have necrotic debris in both basilar segments of her lower lobe and it could not be all removed.  She had bilateral pulmonary infiltrates and dyspnea.  No pathology was seen on the debris.  It was decided to try and clean the rest of the debris out.  DESCRIPTION OF PROCEDURE: After general anesthesia the fiberoptic bronchoscope was passed through the endotracheal tube, using essentially a laser bronchoscope in order to get maximum suction.  The right lower lobe basilar bronchus was really full of debris and this was removed with biopsy forceps, irrigation and suction over a period of about 30 minutes until finally all the debris could be removed and there was less at the basilar segments with just some erythema and inflammatory reaction.  The debris was sent for pathology examination as well as multiple cultures.  After this had been done attention was turned to the left side and again in the basilar segments of the left lower lobe two segments were occluded with yellowish debris.  This was removed and again biopsies were frozen section.  There was not as much on the left side as there was on the  right but it was removed until no more could be irrigated or debrided.  The bronchoscope was removed and the patient returned to the recovery room in stable condition. Dictated by:   D. Karle Plumber, M.D. Attending Physician:  Cameron Proud DD:  09/11/01 TD:  09/12/01 Job: [id number]

## 2011-04-26 NOTE — Procedures (Signed)
[facility]  Patient:    Gail Payne, Gail Payne Visit Number: [id number] MRN: [id number]          Service Type: OUT Location: CARD Attending Physician:  Merwyn Katos Proc. Date: 09/03/01 Admit Date:  08/31/2001 Discharge Date: 08/31/2001   CC:         Norton Blizzard, M.D.   Procedure Report  DATE OF BIRTH:  Aug 16, 1971  PROCEDURE:  Bronchoscopy.  INDICATION:  A 40 year old African-American woman, who presents with persistent chest symptoms and persistent bilateral lower lobe infiltrates refractory to multiple courses of antibiotics.  CT scan of the chest performed recently demonstrates bilateral atelectasis in the bases with the appearance of bilateral lower lobe bronchi obstruction.  This bronchoscopy is performed for diagnostic and therapeutic purposes.  SEDATION:  Fentanyl 100 mcg IV, ______  10 mg IV.  ANESTHESIA:  Topical anesthesia was applied to the nose and throat, and 60 cc of 1% lidocaine were used during the course of the procedure.  DESCRIPTION OF PROCEDURE:  After sedation and anesthesia, the bronchoscope was introduced via the right naris and advanced to the posterior pharynx.  The nasal passage was noted to be extremely tight, and there was some difficulty passing the scope.  The upper airway demonstrated a normal anatomy with symmetric movement of the vocal cords.  Further anesthesia was achieved with 1% lidocaine, and the scope was advanced into the trachea.  Complete airway anesthesia was attempted with 1% lidocaine, but the patient had persistent excessive cough throughout the rest of the procedure.  After reaching a maximum amount of 1% lidocaine, an airway examination was performed.  This demonstrated a normal anatomic airway arrangement.  In both lower lobes, there were irregular, white fibrinous plugs adherent to the wall of the bronchi. During the course of the bronchoscopy, the patient did cough up a large piece of  one of these plugs which was placed in formalin and sent for pathology review.  I did attempt for approximately 15-20 minutes to remove the plugs described below.  Attempts were made using a cytology brush, biopsy forceps, and Mucomyst.  However, the plugs were so adherent to the walls of the airways and the patient had such excessive cough, I was unable to successfully remove the obstructing lesions.  Therefore, the procedure was aborted at that time. Other than excessive cough, the patient tolerated the procedure well.  Vital signs remained stable throughout the course of the procedure.  SPECIMENS:  The specimen described above was sent for pathology.  The brushings were sent for cytology.  In addition, the washings were sent for cytology.  PLAN:  This patient will need to be evaluated by Dr. Edwyna Shell and will need to undergo therapeutic bronchoscopy, either in the operating room with flexible therapeutic scope or perhaps with a rigid bronchoscope.  Referral will be made to Dr. Edwyna Shell as soon as possible. Attending Physician:  Merwyn Katos DD:  09/03/01 TD:  09/03/01 Job: [id number]

## 2011-04-26 NOTE — Op Note (Signed)
[facility]  Patient:    Gail Payne, Gail Payne Visit Number: [id number] MRN: [id number]          Service Type: OUT Location: CARD Attending Physician:  Merwyn Katos Dictated by:   D. Karle Plumber, M.D. Proc. Date: 11/16/01 Admit Date:  10/05/2001 Discharge Date: 10/05/2001   CC:         Onalee Hua B. Simonds, M.D. [facility]   Operative Report  PREOPERATIVE DIAGNOSIS:  Inspissated secretions left lower lobe.  POSTOPERATIVE DIAGNOSIS:  Inspissated secretions left lower lobe.  OPERATION/PROCEDURE:  Fiberoptic bronchoscopy, removal of inspissated secretions left lower lobe.  SURGEON:  D. Karle Plumber, M.D.  ASSISTANT:  ANESTHESIA:  General anesthesia.  INDICATION FOR PROCEDURE:  The patient had bilateral left lower lobes very inspissated secretions, the right and left had been cleared out with previous bronchoscopy but there was a recurrence on the left lower lobe in the medial segments.  DESCRIPTION OF PROCEDURE:  He was brought to the operating room and underwent general anesthesia.  The video bronchoscope was passed and pictures were taken of an almost completely occluded left lower lobe bronchus.  The superior segment was the only thing that was open in the left lower lobe.  The secretions were then removed using multiple biopsies of the secretions. Irrigations with saline, brush sections off the wall and multiple suctioning. It took almost an hour to remove the secretions, clearing out first the left lower lobe orifice and then going down into the basilar segments and clearing out each individual branch, particularly the medial basilar and posterior basilar segments.  After all of this was then removed, biopsies of the secretions were sent for pathological analysis.  Cultures were sent and the patient was returned to the recovery room in stable condition. Dictated by:   D. Karle Plumber, M.D. Attending Physician:  Merwyn Katos DD:   11/16/01 TD:  11/16/01 Job: [id number]

## 2011-04-26 NOTE — Op Note (Signed)
NAME:  Gail Payne, FOELL                        ACCOUNT NO.:  [id number]   MEDICAL RECORD NO.:  [id number]                   PATIENT TYPE:  INP   LOCATION:  9199                                 FACILITY:  [facility]   PHYSICIAN:  [name], M.D.                DATE OF BIRTH:  1971-05-23   DATE OF PROCEDURE:  09/30/2003  DATE OF DISCHARGE:                                 OPERATIVE REPORT   PREOPERATIVE DIAGNOSES:  1. Intrauterine gestation at 57 + 2 weeks.  2. Suspected fetal macrosomia.   POSTOPERATIVE DIAGNOSES:  1. Intrauterine gestation at 50 + 2 weeks.  2. Thickly-stained meconium amniotic fluid.  3. Submucosal fibroids.  4. Left periovarian adhesions.   PROCEDURES:  1. Primary low segment transverse section.  2. Myomectomies.  3. Lysis of adhesions.   SURGEON:  [name], M.D.   ASSISTANT:  Lesly Dukes, M.D.   ANESTHESIA:  Spinal.   FLUIDS REPLACED:  2400 mL Ringer's lactate.   ESTIMATED BLOOD LOSS:  800 mL.   URINE OUTPUT:  300 mL.   COMPLICATIONS:  None.   INDICATION FOR PROCEDURE:  The patient is a 40 year old gravida 1, para 0  female, at 5 +2 weeks by last menstrual period, who presented to maternity  admissions at the [facility] reporting contractions of onset earlier  tonight in addition to pink, mucousy discharge.  Of note, the patient was  scheduled for a primary cesarean section later this morning on September 30, 2003, for suspected fetal macrosomia.  An ultrasound performed September 19, 2003, documented an estimated fetal weight of 4100 g.  The patient's one-  hour glucose tolerance test was 131.  On admission, the patient was noted to  have a cervix which was 1 cm dilated and 50% effaced with the vertex at the  -3 station.  There were contractions noted to be every two to five minutes.  The fetal heart rate tracing documented two late fetal decelerations with an  initial placement on the monitor, which were non-recurrent.  The fetal  heart  rate tracing was reactive.  The patient was diagnosed with early labor, and  a plan was made to proceed with the cesarean section as scheduled.  Risks,  benefits, and alternatives were again discussed with the patient, who chose  to proceed.   FINDINGS:  A viable female delivered at 3:37 a.m.  Apgars were 8 at one minute  and 9 at five minutes.  Weight 7 pounds 15 ounces.  Thick meconium-stained  amniotic fluid was appreciated.  The placenta was meconium-stained.  The  uterus was noted to have anterior lower uterine segment submucosal fibroids.  One measured 4.5 cm in diameter.  This one was noted to be in the mid- to  left side of the lower uterine segment incision.  There was also an  aggregate of smaller fibroids measuring 4 cm in total diameter  along the  right apical region of the incision.  There were filmy periovarian adhesions  around the left ovary.  These were lysed with sharp dissection and monopolar  cautery.  The right ovary and the bilateral fallopian tubes were noted to be  normal.  The cord pH was measured at 7.27.   SPECIMENS:  The placenta and the fibroids were sent to pathology separately.   DESCRIPTION OF PROCEDURE:  With an IV in place, the patient was taken from  maternity admissions down to the operating room.  The patient received a  spinal anesthetic and was then placed in the supine position on the  operating room table.  The abdomen was sterilely prepped and a Foley  catheter was sterilely placed inside the bladder.  She was then sterilely  draped.   A Pfannenstiel incision was created sharply with a scalpel.  This was  carried down to the level of the fascia using monopolar cautery for  hemostasis.  The fascia was then incised in the midline using monopolar  cautery, and the incision was extended bilaterally using the Mayo scissors.  The rectus muscles were used to dissect the muscles off of the overlying  fascia both superiorly and inferiorly.  The  rectus muscles were then sharply  divided in the midline.  The parietal peritoneum was grasped and elevated  with two hemostat clamps.  It was entered sharply.  The incision in the  peritoneum was extended cranially and caudally.   The lower uterine segment was exposed at this time, and the bladder flap was  sharply created.  A bladder retractor was then placed over the bladder, and  a transverse lower uterine segment incision was created with a scalpel.  The  myometrium was noted to be very thick in this region.  The uterine incision  was extended transversely and bilaterally in an upward fashion with a  bandage scissors.  Membranes were ruptured with an Allis clamp, and the  meconium fluid was appreciated.  A hand was inserted through the incision,  and the vertex was delivered without difficulty.  The nares and mouth were  suctioned.  The newborn was noted to have a spontaneous cry and respiration.  With the remainder of the infant delivered the cord was doubly clamped and  cut.  The newborn was carried over to the awaiting pediatricians in vigorous  condition.  Both a cord pH and cord blood sample were obtained at this time.  The patient did receive Ancef 1 g intravenously at cord clamp.  After the  placenta was manually extracted and the patient received the Pitocin, the  uterus was exteriorized for its closure.  The uterine fibroids were  appreciated and initially an attempt was made to see if it was possible to  close the uterine cavity with the fibroids in place.  These both had large  bases to them.  This was not possible, and a decision was made to proceed  with removal of the fibroids themselves, which was accomplished through a  combination of sharp and blunt dissection.  At the base of the fibroid on  the patient's right side, a Kelly clamp was placed and a transfixing suture of 2-0 Vicryl was placed for hemostasis.  The specimens were removed and  were sent to pathology.    The uterine incision was closed with a two-layered closure.  The first layer  was a running locked layer of #1 chromic.  The second layer was an  imbricating layer  of the same.  Hemostasis was noted to be excellent.  The  uterus was returned to the peritoneal cavity, which was irrigated and  suctioned.  The incision was again noted to be hemostatic.   The abdomen was closed.  A running suture of 3-0 Vicryl was used on the  peritoneum.  The rectus muscles were reapproximated in the midline with a  figure-of-eight suture of #1 chromic.  The fascial edges were examined and  found to be hemostatic.  The fascia was therefore closed with a running  suture of 0 Vicryl.  The subcutaneous tissue was then irrigated and  suctioned.  It was made hemostatic with monopolar cautery.  Interrupted  sutures of 3-0 plain were placed in the subcutaneous layer.  Staples were  placed in the skin and a sterile bandage was placed over this.   This completed the patient's procedure.  There were no complications to the  procedure.  All needle, instrument, and sponge counts were correct.                                               [name], M.D.    BES/MEDQ  D:  09/30/2003  T:  09/30/2003  Job:  [id number]

## 2011-04-26 NOTE — Op Note (Signed)
Gail Payne, Gail Payne              ACCOUNT NO.:  [id number]   MEDICAL RECORD NO.:  [id number]          PATIENT TYPE:  AMB   LOCATION:  SDC                           FACILITY:  [facility]   PHYSICIAN:  [name], M.D.   DATE OF BIRTH:  1970/12/13   DATE OF PROCEDURE:  08/04/2006  DATE OF DISCHARGE:                                 OPERATIVE REPORT   PREOPERATIVE DIAGNOSIS:  Incisional mass.   POSTOPERATIVE DIAGNOSIS:  Incisional mass.   PROCEDURE:  Excision of incisional mass.   SURGEON:  [name], M.D.   ANESTHESIA:  MAC, local with 1% lidocaine.   IV FLUIDS:  1000 cc of lactated Ringer's.   ESTIMATED BLOOD LOSS:  Minimal.   URINE OUTPUT:  Not measured.   COMPLICATIONS:  None.   INDICATIONS FOR PROCEDURE:  The patient is a 40 year old gravida 1, para 1  African American female, status post primary low segment transverse cesarean  section with myomectomy of submucous fibroids on October 30, 2005 who  presented to the office recently with a report of pain and a knot in her  left groin.  The patient was noted to have a mass in the subcutaneous area  of her Pfannenstiel incision.  She subsequently underwent a CT scan of the  abdomen and pelvis on July 01, 2006, and this documented a 2-cm soft tissue  nodule with no evidence of a hernia.  The patient presents now for excision  of the mass after the risks, benefits, and alternatives are discussed.  She  chooses to proceed.   FINDINGS:  A 2.5-cm firm amorphous subcutaneous mass was excised.  This was  suprafascial.   SPECIMENS:  The subcutaneous mass was sent to pathology.   DESCRIPTION OF PROCEDURE:  The patient was reidentified in the preoperative  holding area.  She did receive Ancef 1 g IV for antibiotic prophylaxis.  In  the operating room, the abdomen was sterilely prepped and draped.  The  patient received local 1% lidocaine for anesthesia, and MAC anesthesia was  also performed.   A left apical incision was  created which measured approximately 6 cm in  length.  This was performed sharply with the scalpel.  The dissection  through the subcutaneous layer was performed with sharp dissection with the  scalpel and with monopolar cautery.  The mass was then grasped with a towel  clip, and excision around the mass was performed, again with a combination  of sharp dissection and with monopolar cautery.  The mass was removed and  was sent to pathology.  Hemostasis was created in the subcutaneous layer  with monopolar cautery.  The superficial fascial layer was reconstructed  with a running suture of 0 Vicryl.  The subcutaneous layer was then closed  with interrupted sutures of 2-0 Vicryl.  The skin was closed with a  subcuticular closure of 4-0 Vicryl.  Steri-Strips and benzoin were placed  over the incision, and a sterile bandage was placed over this.   This concluded the patient's surgery.  There were no complications.  All  instrument, needle, and sponge counts were  correct.      [name], M.D.  Electronically Signed     BES/MEDQ  D:  08/04/2006  T:  08/04/2006  Job:  [id number]

## 2011-04-26 NOTE — Discharge Summary (Signed)
NAME:  Gail Payne, Gail Payne                        ACCOUNT NO.:  [id number]   MEDICAL RECORD NO.:  [id number]                   PATIENT TYPE:  INP   LOCATION:  9119                                 FACILITY:  [facility]   PHYSICIAN:  Ilda Mori, M.D.                DATE OF BIRTH:  1971-06-18   DATE OF ADMISSION:  09/30/2003  DATE OF DISCHARGE:  10/03/2003                                 DISCHARGE SUMMARY   FINAL DIAGNOSES:  1. Intrauterine pregnancy at 41-2/[redacted] weeks gestation.  2. Suspected macrosomia.  3. Thickly-stained meconium amniotic fluid.  4. Submucosal fibroids.  5. Left periovarian adhesions.   PROCEDURE:  1. Primary low transverse cesarean section.  2. Myomectomy.  3. Lysis of adhesions.   SURGEONS:  1. Dr. Conley Simmonds.  2. Dr. Elsie Lincoln.   COMPLICATIONS:  None.   HISTORY:  This 40 year old, G1, P0, presented at 24 and 2/7ths weeks  gestation complaining of contractions and a mucousy discharge.  The patient  was scheduled for a cesarean section on September 30, 2003 for suspected  macrosomia.  An ultrasound was performed in the office with an estimated  fetal weight of about 4100 gm.  The patient did have a normal one-hour  glucose tolerance test.  Her antepartum course had been uncomplicated.  She  was diagnosed with early labor, and a decision was made to proceed with  cesarean section as scheduled.  The risks and benefits of the course were  discussed with the patient.  The patient was taken to the operating room on  September 30, 2003 by Dr. Conley Simmonds, where a primary low transverse cesarean  section was performed with the delivery of a 7 pound, 15 ounce female infant  with Apgars of 8/9.  At this point, thick meconium-stained amniotic fluid  was noted.  There was an anterior lower uterine submucosal fibroid that was  removed by Dr. Edward Jolly.  There was also a filmy peri-ovarian adhesion that was  lysed on the left ovary.  The procedure all went without complication.   The  patient's postoperative course was benign without significant fevers.  She  was felt ready for discharge on postoperative day #3.   DIET:  She was sent home on a regular diet.   ACTIVITY:  Told to decrease activity.   MEDICATIONS:  1. Told to continue prenatal vitamins.  2. Was given Tylox 1-2 q.4h. as needed for pain.   FOLLOW UP:  Was told to follow up in the office in four weeks.   LABORATORIES ON DISCHARGE:  The patient had a hemoglobin of 12.4.  White  blood cell count 10.9.     Leilani Able, P.A.-C.                Ilda Mori, M.D.    MB/MEDQ  D:  12/07/2003  T:  12/07/2003  Job:  [id number]

## 2011-04-26 NOTE — Op Note (Signed)
Gail Payne, Gail Payne              ACCOUNT NO.:  [id number]   MEDICAL RECORD NO.:  [id number]          PATIENT TYPE:  AMB   LOCATION:  SDC                           FACILITY:  [facility]   PHYSICIAN:  [name], M.D.   DATE OF BIRTH:  [birth year]   DATE OF PROCEDURE:  09/30/2006  DATE OF DISCHARGE:                                 OPERATIVE REPORT   PREOPERATIVE DIAGNOSIS:  Desire for permanent sterilization.   POSTOPERATIVE DIAGNOSIS:  Desire for permanent sterilization.   PROCEDURE:  Laparoscopic bilateral tubal ligation with bipolar cautery.   ANESTHESIA:  General endotracheal.   SURGEON:  [name], M.D.   IV FLUIDS:  1100 mL Ringer's lactate.   ESTIMATED BLOOD LOSS:  Minimal.   URINE OUTPUT:  50 mL   COMPLICATIONS:  None.   INDICATIONS FOR PROCEDURE:  The patient is a 40 year old gravida 1, para 1  African American female who presented with a desire for permanent  sterilization.  The patient had been using oral contraceptive pills and she  declined future childbearing and desired for permanent sterilization.  A  plan is made to proceed with a laparoscopic bilateral tubal ligation after  risks, benefits, and alternatives are discussed.  The patient was quoted a  failure rate of the procedure of approximately 1 in 250 to 1 in 300 which  may result in either an intrauterine or ectopic pregnancy.   FINDINGS:  Laparoscopy demonstrated a normal uterus, tubes and ovaries.  There were some minor adhesions in the anterior cul-de-sac consistent with  the patient's prior cesarean delivery.  These were adhesions which were  consistent with normal adhesions along the vesicouterine fold.  There also  some minor adhesions in the posterior cul-de-sac between the rectum and the  left side of the cul-de-sac.  There were no adhesion loops and these were  therefore not lysed.  The liver and gallbladder and stomach organs appeared  to be normal.  The bowel appeared to be unremarkable.   There was no evidence  of any endometriosis throughout the abdomen or the pelvis.   SPECIMENS:  None.   PROCEDURE:  The patient was reidentified in the preoperative hold area.  She  did receive Ancef 1 gram IV for antibiotic prophylaxis.  The patient was  taken to the operating room where general endotracheal anesthesia was  induced.  The abdomen and vagina were then sterilely prepped and the patient  was draped.  A Foley catheter was placed inside the bladder.  The speculum  was then placed in the vagina and a single-tooth tenaculum was placed on the  anterior cervical lip and this was replaced with a Hulka tenaculum.  The  speculum was withdrawn from the vagina.   The procedure began by grasping the lower aspect of the umbilicus with an  Allis clamp.  A vertical 1 cm incision was then created in the umbilicus.  The dissection was carried down to the fascia using Allis clamp.  The 10 mm  trocar was then inserted directly into the peritoneal cavity while elevating  the abdominal wall.  The laparoscope confirmed  proper placement.  A CO2  pneumoperitoneum was then achieved.  The laparoscope was then replaced  inside the abdominal cavity.   The patient was placed in Trendelenburg position.  The abdomen and pelvis  were examined and the findings are as noted above.  The bilateral tubal  ligation was performed with a Kleppinger forceps with bipolar cautery.  The  right fallopian tube was grasped and followed all the way to its fimbriated  end.  The isthmic portion of the fallopian tube was then coagulated along a  continuous segment of 3 cm of fallopian tube to produce a complete  disruption of the tube.  The same procedure that was performed on the right  fallopian tube was then repeated on the left fallopian tube after it was  followed all the way to its fimbriated end.  The operative sites were  hemostatic.  This concluded the tubal ligation.  The 5 mm suprapubic trocar  which had been  placed under visualization with a laparoscope was removed at  this time.  The pneumoperitoneum was released and the 10 mm umbilical trocar  and the laparoscope were removed simultaneously.   Skin incisions were closed with subcuticular sutures of 3-0 plain.  Steri-  Strips were placed over the suprapubic incision and a sterile bandage was  placed over the umbilical incision.   All of the vaginal instruments and Foley catheter were then removed.  The  patient was awakened and extubated and escorted to the recovery room in  stable and awake condition.  There were no complications to the procedure.  All needle, instrument, and sponge counts were correct.      [name], M.D.  Electronically Signed     BES/MEDQ  D:  09/30/2006  T:  10/01/2006  Job:  [id number]

## 2011-04-26 NOTE — Op Note (Signed)
[place]. [facility]  Patient:    EARLIE, ARCIGA Visit Number: [id number] MRN: [id number]          Service Type: DSU Location: [facility] 2899 22 Attending Physician:  Cameron Proud Dictated by:   D. Karle Plumber, M.D. Admit Date:  12/21/2001                             Operative Report  PREOPERATIVE DIAGNOSIS:  Persistent left lower lobe atelectasis secondary to necrotic debris.  POSTOPERATIVE DIAGNOSIS:  Persistent left lower lobe atelectasis secondary to necrotic debris.  OPERATION PERFORMED:  Fiberoptic bronchoscopy.  HISTORY:  This patient had several bronchoscopies removing necrotic material from the left lower lobe and right lower lobe.  The right lower lobe after removal as no recurrence but the left lower lobe had persistent atelectasis and it had undergone two previous bronchoscopies.  DESCRIPTION:  After general anesthesia, the fiberoptic bronchoscope was passed through the endotracheal tube.  The right upper lobe, right middle lobe, and right lower lobe orifices were normal; the trachea was in the midline.  The left mainstem, left upper lobe orifices were normal, but in the left lower lobe in the medial basilar segment was occluded with necrotic debris.  This was removed with the biopsy forceps and irrigation until it was completely cleared.  Required multiple passes with the biopsy forceps to remove it.  This was sent for culture was well as pathological examination.  The fiberoptic bronchoscope was removed.  The patient was returned to the recovery room in stable condition. Dictated by:   D. Karle Plumber, M.D. Attending Physician:  Cameron Proud DD:  12/21/01 TD:  12/21/01 Job: [id number]

## 2011-06-07 ENCOUNTER — Telehealth: Payer: Self-pay

## 2011-06-07 NOTE — Telephone Encounter (Signed)
Pt called stating she has no received her B-12 injection in 3 months because she did not have Insurance. Pt is requesting a return call from Dr Yetta Barre' nurse to discuss option to re-start.

## 2011-06-10 ENCOUNTER — Ambulatory Visit (INDEPENDENT_AMBULATORY_CARE_PROVIDER_SITE_OTHER): Payer: 59 | Admitting: *Deleted

## 2011-06-10 DIAGNOSIS — D510 Vitamin B12 deficiency anemia due to intrinsic factor deficiency: Secondary | ICD-10-CM

## 2011-06-10 MED ORDER — CYANOCOBALAMIN 1000 MCG/ML IJ SOLN
1000.0000 ug | Freq: Once | INTRAMUSCULAR | Status: AC
  Administered 2011-06-10: 1000 ug via INTRAMUSCULAR

## 2011-06-10 NOTE — Telephone Encounter (Signed)
Spoke w/pt advised that she may restart injection - she has resumed insurance thru new employer, transferred to scheduler for nurse visit OV.

## 2011-06-11 ENCOUNTER — Ambulatory Visit: Payer: BC Managed Care – PPO

## 2011-06-17 ENCOUNTER — Other Ambulatory Visit: Payer: Self-pay | Admitting: Obstetrics and Gynecology

## 2011-06-17 LAB — HM PAP SMEAR: HM Pap smear: NORMAL

## 2011-06-17 LAB — HM MAMMOGRAPHY: HM Mammogram: NORMAL

## 2011-07-08 ENCOUNTER — Encounter: Payer: Self-pay | Admitting: Internal Medicine

## 2011-07-10 ENCOUNTER — Ambulatory Visit (INDEPENDENT_AMBULATORY_CARE_PROVIDER_SITE_OTHER): Payer: 59 | Admitting: Internal Medicine

## 2011-07-10 ENCOUNTER — Other Ambulatory Visit (INDEPENDENT_AMBULATORY_CARE_PROVIDER_SITE_OTHER): Payer: 59

## 2011-07-10 ENCOUNTER — Ambulatory Visit (INDEPENDENT_AMBULATORY_CARE_PROVIDER_SITE_OTHER)
Admission: RE | Admit: 2011-07-10 | Discharge: 2011-07-10 | Disposition: A | Discharge status: Home / Self Care | Payer: 59 | Source: Ambulatory Visit | Attending: Internal Medicine | Admitting: Internal Medicine

## 2011-07-10 ENCOUNTER — Ambulatory Visit: Payer: 59

## 2011-07-10 ENCOUNTER — Encounter: Payer: Self-pay | Admitting: Internal Medicine

## 2011-07-10 DIAGNOSIS — D510 Vitamin B12 deficiency anemia due to intrinsic factor deficiency: Secondary | ICD-10-CM

## 2011-07-10 DIAGNOSIS — D509 Iron deficiency anemia, unspecified: Secondary | ICD-10-CM

## 2011-07-10 DIAGNOSIS — D519 Vitamin B12 deficiency anemia, unspecified: Secondary | ICD-10-CM

## 2011-07-10 DIAGNOSIS — M179 Osteoarthritis of knee, unspecified: Secondary | ICD-10-CM | POA: Insufficient documentation

## 2011-07-10 DIAGNOSIS — Z23 Encounter for immunization: Secondary | ICD-10-CM

## 2011-07-10 DIAGNOSIS — IMO0002 Reserved for concepts with insufficient information to code with codable children: Secondary | ICD-10-CM

## 2011-07-10 DIAGNOSIS — M1710 Unilateral primary osteoarthritis, unspecified knee: Secondary | ICD-10-CM

## 2011-07-10 DIAGNOSIS — D518 Other vitamin B12 deficiency anemias: Secondary | ICD-10-CM

## 2011-07-10 DIAGNOSIS — M25569 Pain in unspecified knee: Secondary | ICD-10-CM

## 2011-07-10 LAB — FERRITIN: Ferritin: 17 ng/mL (ref 10.0–291.0)

## 2011-07-10 LAB — CBC WITH DIFFERENTIAL/PLATELET
Basophils Absolute: 0 10*3/uL (ref 0.0–0.1)
Basophils Relative: 0.3 % (ref 0.0–3.0)
Eosinophils Absolute: 0.1 10*3/uL (ref 0.0–0.7)
Eosinophils Relative: 1.6 % (ref 0.0–5.0)
HCT: 33.4 % — ABNORMAL LOW (ref 36.0–46.0)
Hemoglobin: 10.6 g/dL — ABNORMAL LOW (ref 12.0–15.0)
Lymphocytes Relative: 27.9 % (ref 12.0–46.0)
Lymphs Abs: 2.5 10*3/uL (ref 0.7–4.0)
MCHC: 31.9 g/dL (ref 30.0–36.0)
MCV: 79.3 fl (ref 78.0–100.0)
Monocytes Absolute: 0.5 10*3/uL (ref 0.1–1.0)
Monocytes Relative: 5.1 % (ref 3.0–12.0)
Neutro Abs: 5.9 10*3/uL (ref 1.4–7.7)
Neutrophils Relative %: 65.1 % (ref 43.0–77.0)
Platelets: 415 10*3/uL — ABNORMAL HIGH (ref 150.0–400.0)
RBC: 4.21 Mil/uL (ref 3.87–5.11)
RDW: 17.4 % — ABNORMAL HIGH (ref 11.5–14.6)
WBC: 9.1 10*3/uL (ref 4.5–10.5)

## 2011-07-10 LAB — IBC PANEL
Iron: 27 ug/dL — ABNORMAL LOW (ref 42–145)
Saturation Ratios: 7.2 % — ABNORMAL LOW (ref 20.0–50.0)
Transferrin: 267.3 mg/dL (ref 212.0–360.0)

## 2011-07-10 MED ORDER — GLUCOSAMINE-CHONDROITIN 500-400 MG PO TABS: 1.0000 | ORAL_TABLET | Freq: Three times a day (TID) | ORAL | Status: AC

## 2011-07-10 MED ORDER — CYANOCOBALAMIN 1000 MCG/ML IJ SOLN
1000.0000 ug | Freq: Once | INTRAMUSCULAR | Status: AC
  Administered 2011-07-10: 1000 ug via INTRAMUSCULAR

## 2011-07-10 MED ORDER — NAPROXEN 375 MG PO TABS: 375.0000 mg | ORAL_TABLET | Freq: Two times a day (BID) | ORAL | Status: DC

## 2011-07-10 NOTE — Patient Instructions (Signed)
Arthritis - Degenerative, Osteoarthritis You have osteoarthritis. This is the wear and tear arthritis that comes with aging. It is also called degenerative arthritis. This is common in people past middle age. It is caused by stress on the joints from living. The large weight bearing joints of the lower extremities are most often affected. The knees, hips, back, neck, and hands can become painful, swollen, and stiff. This is the most common type of arthritis. It comes on with age, carrying too much weight, and from injury. Treatment includes resting the sore joint until the pain and swelling improve. Crutches or a walker may be needed for severe flares. Only take over-the-counter or prescription medicines for pain, discomfort, or fever as directed by your caregiver. Local heat therapy may improve motion. Cortisone shots into the joint are sometimes used to reduce pain and swelling during flares. Osteoarthritis is usually not crippling and progresses slowly. There are things you can do to decrease pain:  Avoid high impact activities.   Exercise regularly.   Low impact exercises such as walking, biking and swimming help to keep the muscles strong and keep normal joint function.   Stretching helps to keep your range of motion.   Lose weight if you are overweight. This reduces joint stress.  In severe cases when you have pain at rest or increasing disability, joint surgery may be helpful. See your caregiver for follow-up treatment as recommended.  SEEK IMMEDIATE MEDICAL CARE IF:  You have severe joint pain.   Marked swelling and redness in your joint develops.   You develop a high fever.  Document Released: 11/25/2005 Document Re-Released: 03/26/2008 [employer] Patient Information 2011 [employer]. 

## 2011-07-10 NOTE — Assessment & Plan Note (Signed)
Check CBC and iron levels. 

## 2011-07-10 NOTE — Progress Notes (Signed)
Subjective:    Patient ID: Gail Payne, female    DOB: 07-31-1971, 40 y.o.   MRN: [id number]  Knee Pain  The incident occurred more than 1 week ago. The incident occurred at home. There was no injury mechanism. The pain is present in the right knee and left knee. The quality of the pain is described as aching. The pain is at a severity of 2/10. The pain is mild. The pain has been intermittent since onset. Pertinent negatives include no inability to bear weight, loss of motion, loss of sensation, muscle weakness, numbness or tingling. She reports no foreign bodies present. The symptoms are aggravated by movement. She has tried nothing for the symptoms.  Anemia Presents for follow-up visit. There has been no abdominal pain, anorexia, bruising/bleeding easily, confusion, fever, leg swelling, light-headedness, malaise/fatigue, pallor, palpitations, paresthesias, pica or weight loss. Signs of blood loss that are not present include hematemesis, hematochezia, melena, menorrhagia and vaginal bleeding. There are no compliance problems.  Compliance with medications is 76-100%.      Review of Systems  Constitutional: Negative for fever, chills, weight loss, malaise/fatigue, diaphoresis, activity change, appetite change, fatigue and unexpected weight change.  HENT: Negative.   Eyes: Negative.   Respiratory: Negative.   Cardiovascular: Negative.  Negative for palpitations.  Gastrointestinal: Negative.  Negative for abdominal pain, melena, hematochezia, anorexia and hematemesis.  Genitourinary: Negative for dysuria, urgency, frequency, hematuria, flank pain, decreased urine volume, vaginal bleeding, vaginal discharge, enuresis, difficulty urinating, genital sores, vaginal pain, menstrual problem, pelvic pain, dyspareunia and menorrhagia.  Musculoskeletal: Positive for arthralgias (knees). Negative for myalgias, back pain, joint swelling and gait problem.  Skin: Negative for color change, pallor, rash and  wound.  Neurological: Negative for dizziness, tingling, tremors, seizures, syncope, facial asymmetry, speech difficulty, weakness, light-headedness, numbness, headaches and paresthesias.  Hematological: Negative for adenopathy. Does not bruise/bleed easily.  Psychiatric/Behavioral: Negative for suicidal ideas, hallucinations, behavioral problems, confusion, sleep disturbance, self-injury, dysphoric mood, decreased concentration and agitation. The patient is not nervous/anxious and is not hyperactive.        Objective:   Physical Exam  Vitals reviewed. Constitutional: She is oriented to person, place, and time. She appears well-developed and well-nourished. No distress.  HENT:  Head: Normocephalic and atraumatic.  Right Ear: External ear normal.  Left Ear: External ear normal.  Nose: Nose normal.  Mouth/Throat: Oropharynx is clear and moist. No oropharyngeal exudate.  Eyes: Conjunctivae and EOM are normal. Pupils are equal, round, and reactive to light. Right eye exhibits no discharge. Left eye exhibits no discharge. No scleral icterus.  Neck: Normal range of motion. Neck supple. No JVD present. No tracheal deviation present. No thyromegaly present.  Cardiovascular: Normal rate, regular rhythm, normal heart sounds and intact distal pulses.  Exam reveals no gallop and no friction rub.   No murmur heard. Pulmonary/Chest: Effort normal and breath sounds normal. No stridor. No respiratory distress. She has no wheezes. She has no rales. She exhibits no tenderness.  Abdominal: Soft. Bowel sounds are normal. She exhibits no distension and no mass. There is no tenderness. There is no rebound and no guarding.  Musculoskeletal: Normal range of motion. She exhibits no edema and no tenderness.       Right knee: She exhibits normal range of motion, no swelling, no effusion, no ecchymosis, no deformity, no laceration, no erythema, normal alignment, no LCL laxity, normal patellar mobility, no bony  tenderness, normal meniscus and no MCL laxity. no tenderness found.  Left knee: Normal. She exhibits normal range of motion, no swelling, no effusion, no ecchymosis, no deformity, no laceration, no erythema, normal alignment, no LCL laxity, normal patellar mobility, no bony tenderness, normal meniscus and no MCL laxity. no tenderness found.       There is mild crepitance in the knees L>R  Lymphadenopathy:    She has no cervical adenopathy.  Neurological: She is alert and oriented to person, place, and time. She has normal reflexes. She displays normal reflexes. No cranial nerve deficit. Coordination normal.  Skin: Skin is warm and dry. No rash noted. She is not diaphoretic. No erythema. No pallor.  Psychiatric: She has a normal mood and affect. Her behavior is normal. Judgment and thought content normal.      Lab Results  Component Value Date   WBC 8.7 01/04/2011   HGB 11.4* 01/04/2011   HCT 34.5* 01/04/2011   PLT 461.0* 01/04/2011   CHOL 156 01/04/2011   TRIG 51.0 01/04/2011   HDL 36.40* 01/04/2011   ALT 14 09/05/2008   AST 25 09/05/2008   NA 138 01/04/2011   K 4.7 01/04/2011   CL 103 01/04/2011   CREATININE 0.6 01/04/2011   BUN 12 01/04/2011   CO2 29 01/04/2011   TSH 0.97 01/04/2011      Assessment & Plan:

## 2011-07-10 NOTE — Assessment & Plan Note (Signed)
Start nsaids and glucosamine

## 2011-07-10 NOTE — Assessment & Plan Note (Signed)
I will check plain films today

## 2011-07-10 NOTE — Assessment & Plan Note (Signed)
B12 injection given today. 

## 2011-07-11 ENCOUNTER — Encounter: Payer: Self-pay | Admitting: Internal Medicine

## 2011-07-26 ENCOUNTER — Encounter: Payer: Self-pay | Admitting: Internal Medicine

## 2011-07-26 ENCOUNTER — Ambulatory Visit (INDEPENDENT_AMBULATORY_CARE_PROVIDER_SITE_OTHER): Payer: 59 | Admitting: Internal Medicine

## 2011-07-26 VITALS — BP 138/78 | HR 80 | Temp 98.6°F | Resp 16 | Wt 256.0 lb

## 2011-07-26 DIAGNOSIS — D509 Iron deficiency anemia, unspecified: Secondary | ICD-10-CM

## 2011-07-26 DIAGNOSIS — G5600 Carpal tunnel syndrome, unspecified upper limb: Secondary | ICD-10-CM

## 2011-07-26 DIAGNOSIS — G5601 Carpal tunnel syndrome, right upper limb: Secondary | ICD-10-CM

## 2011-07-26 MED ORDER — FERROUS SULFATE 325 (65 FE) MG PO TABS: 325.0000 mg | ORAL_TABLET | Freq: Three times a day (TID) | ORAL | Status: DC

## 2011-07-26 NOTE — Assessment & Plan Note (Signed)
She has been out of iron tablets so I wrote another Rx for them today

## 2011-07-26 NOTE — Assessment & Plan Note (Signed)
S/s suggest CTS so I have asked her to do a NCS/EMG

## 2011-07-26 NOTE — Progress Notes (Signed)
Subjective:    Patient ID: Gail Payne, female    DOB: 1971-07-29, 40 y.o.   MRN: [id number]  HPI She returns c/o a vague pain over her right arm from the elbow down to her wrist for several months with intermittent tingling in her right forearm. She does not recall any trauma or injury. She does do some repetitive activities.  Also, she is being treated for right OM and has been taking Zpak and the ear pain is better but she still has a muffled sensation in her right ear.   Review of Systems  Constitutional: Negative for fever, chills, diaphoresis, activity change, appetite change, fatigue and unexpected weight change.  HENT: Negative for hearing loss, ear pain, nosebleeds, congestion, sore throat, facial swelling, rhinorrhea, sneezing, drooling, mouth sores, trouble swallowing, neck pain, neck stiffness, dental problem, voice change, postnasal drip, sinus pressure, tinnitus and ear discharge.   Eyes: Negative for photophobia, pain, discharge, redness, itching and visual disturbance.  Respiratory: Negative for apnea, cough, choking, chest tightness, shortness of breath, wheezing and stridor.   Cardiovascular: Negative for chest pain, palpitations and leg swelling.  Gastrointestinal: Negative for nausea, vomiting, abdominal pain, diarrhea, constipation, abdominal distention and anal bleeding.  Genitourinary: Negative.   Musculoskeletal: Positive for arthralgias (right forearm and wrist). Negative for myalgias, back pain, joint swelling and gait problem.  Skin: Negative for color change, pallor, rash and wound.  Neurological: Negative for dizziness, tremors, seizures, syncope, facial asymmetry, speech difficulty, weakness, light-headedness, numbness and headaches.  Hematological: Negative for adenopathy. Does not bruise/bleed easily.  Psychiatric/Behavioral: Negative.        Objective:   Physical Exam  Vitals reviewed. Constitutional: She is oriented to person, place, and time. She  appears well-developed and well-nourished. No distress.  HENT:  Right Ear: Hearing, external ear and ear canal normal. No lacerations. No drainage, swelling or tenderness. No foreign bodies. No mastoid tenderness. Tympanic membrane is retracted. Tympanic membrane is not injected, not scarred, not perforated, not erythematous and not bulging. Tympanic membrane mobility is normal. No middle ear effusion. No hemotympanum. No decreased hearing is noted.  Left Ear: Hearing, tympanic membrane, external ear and ear canal normal.  Eyes: Conjunctivae and EOM are normal. Pupils are equal, round, and reactive to light. Right eye exhibits no discharge. Left eye exhibits no discharge. No scleral icterus.  Neck: Normal range of motion. Neck supple. No JVD present. No tracheal deviation present. No thyromegaly present.  Cardiovascular: Normal rate, regular rhythm, normal heart sounds and intact distal pulses.  Exam reveals no gallop and no friction rub.   No murmur heard. Pulmonary/Chest: Effort normal and breath sounds normal. No stridor. No respiratory distress. She has no wheezes. She has no rales. She exhibits no tenderness.  Abdominal: Soft. Bowel sounds are normal. She exhibits no distension and no mass. There is no tenderness. There is no rebound and no guarding.  Musculoskeletal: Normal range of motion. She exhibits no edema and no tenderness.  Lymphadenopathy:    She has no cervical adenopathy.  Neurological: She is alert and oriented to person, place, and time. She has normal reflexes. She displays normal reflexes. No cranial nerve deficit. She exhibits normal muscle tone. Coordination normal.  Skin: Skin is warm and dry. No rash noted. She is not diaphoretic. No erythema. No pallor.  Psychiatric: She has a normal mood and affect. Her behavior is normal. Judgment and thought content normal.     Lab Results  Component Value Date   WBC 9.1 07/10/2011  HGB 10.6* 07/10/2011   HCT 33.4* 07/10/2011   PLT  415.0* 07/10/2011   CHOL 156 01/04/2011   TRIG 51.0 01/04/2011   HDL 36.40* 01/04/2011   ALT 14 09/05/2008   AST 25 09/05/2008   NA 138 01/04/2011   K 4.7 01/04/2011   CL 103 01/04/2011   CREATININE 0.6 01/04/2011   BUN 12 01/04/2011   CO2 29 01/04/2011   TSH 0.97 01/04/2011       Assessment & Plan:

## 2011-08-05 ENCOUNTER — Telehealth: Payer: Self-pay | Admitting: *Deleted

## 2011-08-05 NOTE — Telephone Encounter (Signed)
error 

## 2011-08-09 ENCOUNTER — Ambulatory Visit (INDEPENDENT_AMBULATORY_CARE_PROVIDER_SITE_OTHER): Payer: 59

## 2011-08-09 ENCOUNTER — Ambulatory Visit: Payer: 59 | Admitting: Neurology

## 2011-08-09 DIAGNOSIS — D519 Vitamin B12 deficiency anemia, unspecified: Secondary | ICD-10-CM

## 2011-08-09 DIAGNOSIS — D518 Other vitamin B12 deficiency anemias: Secondary | ICD-10-CM

## 2011-08-09 DIAGNOSIS — D509 Iron deficiency anemia, unspecified: Secondary | ICD-10-CM

## 2011-08-09 MED ORDER — CYANOCOBALAMIN 1000 MCG/ML IJ SOLN
1000.0000 ug | Freq: Once | INTRAMUSCULAR | Status: AC
  Administered 2011-08-09: 1000 ug via INTRAMUSCULAR

## 2011-08-29 ENCOUNTER — Telehealth: Payer: Self-pay

## 2011-08-29 NOTE — Telephone Encounter (Signed)
Patient called [facility] requesting results of carper tunnel study

## 2011-08-29 NOTE — Telephone Encounter (Signed)
Yes, she has cts

## 2011-08-29 NOTE — Telephone Encounter (Signed)
Returned call back to pt at [phone], no answer// will try again later

## 2011-08-30 NOTE — Telephone Encounter (Signed)
Patient notified

## 2011-09-04 ENCOUNTER — Encounter: Payer: Self-pay | Admitting: Internal Medicine

## 2011-09-13 ENCOUNTER — Ambulatory Visit: Payer: 59 | Admitting: Internal Medicine

## 2011-09-26 ENCOUNTER — Encounter: Payer: Self-pay | Admitting: Internal Medicine

## 2011-09-26 ENCOUNTER — Ambulatory Visit (INDEPENDENT_AMBULATORY_CARE_PROVIDER_SITE_OTHER): Payer: 59 | Admitting: Internal Medicine

## 2011-09-26 ENCOUNTER — Other Ambulatory Visit (INDEPENDENT_AMBULATORY_CARE_PROVIDER_SITE_OTHER): Payer: 59

## 2011-09-26 VITALS — BP 128/78 | HR 64 | Temp 97.1°F | Resp 16 | Wt 259.0 lb

## 2011-09-26 DIAGNOSIS — D510 Vitamin B12 deficiency anemia due to intrinsic factor deficiency: Secondary | ICD-10-CM

## 2011-09-26 DIAGNOSIS — J45909 Unspecified asthma, uncomplicated: Secondary | ICD-10-CM

## 2011-09-26 DIAGNOSIS — D519 Vitamin B12 deficiency anemia, unspecified: Secondary | ICD-10-CM

## 2011-09-26 DIAGNOSIS — D518 Other vitamin B12 deficiency anemias: Secondary | ICD-10-CM

## 2011-09-26 DIAGNOSIS — D509 Iron deficiency anemia, unspecified: Secondary | ICD-10-CM

## 2011-09-26 DIAGNOSIS — Z23 Encounter for immunization: Secondary | ICD-10-CM

## 2011-09-26 DIAGNOSIS — G5600 Carpal tunnel syndrome, unspecified upper limb: Secondary | ICD-10-CM

## 2011-09-26 DIAGNOSIS — G5601 Carpal tunnel syndrome, right upper limb: Secondary | ICD-10-CM

## 2011-09-26 LAB — CBC WITH DIFFERENTIAL/PLATELET
Basophils Absolute: 0 10*3/uL (ref 0.0–0.1)
Basophils Relative: 0.4 % (ref 0.0–3.0)
Eosinophils Absolute: 0.2 10*3/uL (ref 0.0–0.7)
Eosinophils Relative: 2.1 % (ref 0.0–5.0)
HCT: 35.3 % — ABNORMAL LOW (ref 36.0–46.0)
Hemoglobin: 11.4 g/dL — ABNORMAL LOW (ref 12.0–15.0)
Lymphocytes Relative: 23 % (ref 12.0–46.0)
Lymphs Abs: 2.2 10*3/uL (ref 0.7–4.0)
MCHC: 32.2 g/dL (ref 30.0–36.0)
MCV: 81.8 fl (ref 78.0–100.0)
Monocytes Absolute: 0.4 10*3/uL (ref 0.1–1.0)
Monocytes Relative: 4.3 % (ref 3.0–12.0)
Neutro Abs: 6.8 10*3/uL (ref 1.4–7.7)
Neutrophils Relative %: 70.2 % (ref 43.0–77.0)
Platelets: 391 10*3/uL (ref 150.0–400.0)
RBC: 4.31 Mil/uL (ref 3.87–5.11)
RDW: 17.6 % — ABNORMAL HIGH (ref 11.5–14.6)
WBC: 9.6 10*3/uL (ref 4.5–10.5)

## 2011-09-26 LAB — IBC PANEL
Iron: 25 ug/dL — ABNORMAL LOW (ref 42–145)
Saturation Ratios: 7.3 % — ABNORMAL LOW (ref 20.0–50.0)
Transferrin: 246.3 mg/dL (ref 212.0–360.0)

## 2011-09-26 LAB — FOLATE: Folate: 11.7 ng/mL (ref >5.9)

## 2011-09-26 LAB — VITAMIN B12: Vitamin B-12: >1500 pg/mL — ABNORMAL HIGH (ref 211–911)

## 2011-09-26 MED ORDER — CYANOCOBALAMIN 1000 MCG/ML IJ SOLN
1000.0000 ug | Freq: Once | INTRAMUSCULAR | Status: AC
  Administered 2011-09-26: 1000 ug via INTRAMUSCULAR

## 2011-09-26 MED ORDER — BECLOMETHASONE DIPROPIONATE 40 MCG/ACT IN AERS: 1.0000 | INHALATION_SPRAY | Freq: Two times a day (BID) | RESPIRATORY_TRACT | Status: DC

## 2011-09-26 MED ORDER — ALBUTEROL SULFATE HFA 108 (90 BASE) MCG/ACT IN AERS: 2.0000 | INHALATION_SPRAY | RESPIRATORY_TRACT | Status: DC | PRN

## 2011-09-26 NOTE — Patient Instructions (Signed)

## 2011-09-26 NOTE — Assessment & Plan Note (Signed)
I will check her CBC and her iron level 

## 2011-09-26 NOTE — Assessment & Plan Note (Signed)
CBC today.  

## 2011-09-26 NOTE — Progress Notes (Signed)
Subjective:    Patient ID: Gail Payne, female    DOB: 1971/03/07, 40 y.o.   MRN: [id number]  Anemia Presents for follow-up visit. There has been no abdominal pain, anorexia, bruising/bleeding easily, confusion, fever, leg swelling, light-headedness, malaise/fatigue, pallor, palpitations, paresthesias, pica or weight loss. Signs of blood loss that are not present include hematemesis, hematochezia, melena, menorrhagia and vaginal bleeding. There are no compliance problems.  Compliance with medications is 76-100%.  Asthma She complains of wheezing. There is no chest tightness, cough, difficulty breathing, frequent throat clearing, hemoptysis, shortness of breath or sputum production. This is a recurrent problem. The current episode started in the past 7 days. The problem occurs intermittently. The problem has been unchanged. Pertinent negatives include no appetite change, chest pain, dyspnea on exertion, ear congestion, ear pain, fever, headaches, heartburn, malaise/fatigue, myalgias, nasal congestion, orthopnea, PND, postnasal drip, rhinorrhea, sneezing, sore throat, sweats, trouble swallowing or weight loss. Her symptoms are aggravated by nothing. Her symptoms are alleviated by beta-agonist. She reports significant improvement on treatment. Her past medical history is significant for asthma.      Review of Systems  Constitutional: Negative for fever, chills, weight loss, malaise/fatigue, diaphoresis, activity change, appetite change, fatigue and unexpected weight change.  HENT: Negative for ear pain, sore throat, rhinorrhea, sneezing, trouble swallowing and postnasal drip.   Eyes: Negative.   Respiratory: Positive for wheezing. Negative for apnea, cough, hemoptysis, sputum production, choking, chest tightness, shortness of breath and stridor.   Cardiovascular: Negative for chest pain, dyspnea on exertion, palpitations, leg swelling and PND.  Gastrointestinal: Negative for heartburn, nausea,  vomiting, abdominal pain, diarrhea, constipation, blood in stool, melena, hematochezia, abdominal distention, anal bleeding, rectal pain, anorexia and hematemesis.  Genitourinary: Negative for dysuria, urgency, frequency, hematuria, flank pain, decreased urine volume, vaginal bleeding, difficulty urinating, dyspareunia and menorrhagia.  Musculoskeletal: Negative for myalgias, back pain, joint swelling, arthralgias and gait problem.  Skin: Negative for color change, pallor, rash and wound.  Neurological: Negative for dizziness, tremors, seizures, syncope, facial asymmetry, speech difficulty, weakness, light-headedness, numbness, headaches and paresthesias.  Hematological: Negative for adenopathy. Does not bruise/bleed easily.  Psychiatric/Behavioral: Negative.  Negative for confusion.       Objective:   Physical Exam  Vitals reviewed. Constitutional: She is oriented to person, place, and time. She appears well-developed and well-nourished. No distress.  HENT:  Head: Normocephalic and atraumatic.  Mouth/Throat: Oropharynx is clear and moist. No oropharyngeal exudate.  Eyes: Conjunctivae are normal. Right eye exhibits no discharge. Left eye exhibits no discharge. No scleral icterus.  Neck: Normal range of motion. Neck supple. No JVD present. No tracheal deviation present. No thyromegaly present.  Cardiovascular: Normal rate, regular rhythm, normal heart sounds and intact distal pulses.  Exam reveals no gallop and no friction rub.   No murmur heard. Pulmonary/Chest: Effort normal. No accessory muscle usage or stridor. Not tachypneic. No respiratory distress. She has no decreased breath sounds. She has wheezes in the right middle field and the left middle field. She has no rhonchi. She has no rales. She exhibits no tenderness.  Abdominal: Soft. Bowel sounds are normal. She exhibits no distension and no mass. There is no tenderness. There is no rebound and no guarding.  Musculoskeletal: Normal range  of motion. She exhibits no edema and no tenderness.  Lymphadenopathy:    She has no cervical adenopathy.  Neurological: She is oriented to person, place, and time. She displays normal reflexes. She exhibits normal muscle tone.  Skin: Skin is warm and dry.  No rash noted. She is not diaphoretic. No erythema. No pallor.  Psychiatric: She has a normal mood and affect. Her behavior is normal. Judgment and thought content normal.      Lab Results  Component Value Date   WBC 9.1 07/10/2011   HGB 10.6* 07/10/2011   HCT 33.4* 07/10/2011   PLT 415.0* 07/10/2011   GLUCOSE 92 01/04/2011   CHOL 156 01/04/2011   TRIG 51.0 01/04/2011   HDL 36.40* 01/04/2011   LDLCALC 109* 01/04/2011   ALT 14 09/05/2008   AST 25 09/05/2008   NA 138 01/04/2011   K 4.7 01/04/2011   CL 103 01/04/2011   CREATININE 0.6 01/04/2011   BUN 12 01/04/2011   CO2 29 01/04/2011   TSH 0.97 01/04/2011      Assessment & Plan:

## 2011-09-26 NOTE — Assessment & Plan Note (Signed)
She got a B12 injection today

## 2011-09-26 NOTE — Assessment & Plan Note (Signed)
She tells me that this is doing much better

## 2011-09-26 NOTE — Assessment & Plan Note (Signed)
She will restart her meds.

## 2011-10-14 ENCOUNTER — Ambulatory Visit: Payer: 59

## 2011-10-28 ENCOUNTER — Ambulatory Visit: Payer: 59

## 2011-11-01 ENCOUNTER — Ambulatory Visit (INDEPENDENT_AMBULATORY_CARE_PROVIDER_SITE_OTHER): Payer: 59

## 2011-11-01 DIAGNOSIS — D509 Iron deficiency anemia, unspecified: Secondary | ICD-10-CM

## 2011-11-01 MED ORDER — CYANOCOBALAMIN 1000 MCG/ML IJ SOLN
1000.0000 ug | Freq: Once | INTRAMUSCULAR | Status: AC
  Administered 2011-11-01: 1000 ug via INTRAMUSCULAR

## 2011-11-04 ENCOUNTER — Ambulatory Visit: Payer: 59 | Admitting: Internal Medicine

## 2011-11-19 ENCOUNTER — Encounter: Payer: Self-pay | Admitting: Internal Medicine

## 2011-11-19 ENCOUNTER — Ambulatory Visit (INDEPENDENT_AMBULATORY_CARE_PROVIDER_SITE_OTHER)
Admission: RE | Admit: 2011-11-19 | Discharge: 2011-11-19 | Disposition: A | Discharge status: Home / Self Care | Payer: 59 | Source: Ambulatory Visit | Attending: Internal Medicine | Admitting: Internal Medicine

## 2011-11-19 ENCOUNTER — Ambulatory Visit (INDEPENDENT_AMBULATORY_CARE_PROVIDER_SITE_OTHER): Payer: 59 | Admitting: Internal Medicine

## 2011-11-19 ENCOUNTER — Other Ambulatory Visit (INDEPENDENT_AMBULATORY_CARE_PROVIDER_SITE_OTHER): Payer: 59

## 2011-11-19 DIAGNOSIS — R059 Cough, unspecified: Secondary | ICD-10-CM

## 2011-11-19 DIAGNOSIS — D509 Iron deficiency anemia, unspecified: Secondary | ICD-10-CM

## 2011-11-19 DIAGNOSIS — J45901 Unspecified asthma with (acute) exacerbation: Secondary | ICD-10-CM | POA: Insufficient documentation

## 2011-11-19 DIAGNOSIS — R05 Cough: Secondary | ICD-10-CM

## 2011-11-19 DIAGNOSIS — J189 Pneumonia, unspecified organism: Secondary | ICD-10-CM

## 2011-11-19 DIAGNOSIS — J45909 Unspecified asthma, uncomplicated: Secondary | ICD-10-CM

## 2011-11-19 DIAGNOSIS — J209 Acute bronchitis, unspecified: Secondary | ICD-10-CM

## 2011-11-19 LAB — CBC WITH DIFFERENTIAL/PLATELET
Basophils Absolute: 0 10*3/uL (ref 0.0–0.1)
Basophils Relative: 0.4 % (ref 0.0–3.0)
Eosinophils Absolute: 0.2 10*3/uL (ref 0.0–0.7)
Eosinophils Relative: 1.5 % (ref 0.0–5.0)
HCT: 34.6 % — ABNORMAL LOW (ref 36.0–46.0)
Hemoglobin: 11.4 g/dL — ABNORMAL LOW (ref 12.0–15.0)
Lymphocytes Relative: 31.6 % (ref 12.0–46.0)
Lymphs Abs: 3.3 10*3/uL (ref 0.7–4.0)
MCHC: 32.8 g/dL (ref 30.0–36.0)
MCV: 81.9 fl (ref 78.0–100.0)
Monocytes Absolute: 0.6 10*3/uL (ref 0.1–1.0)
Monocytes Relative: 5.6 % (ref 3.0–12.0)
Neutro Abs: 6.3 10*3/uL (ref 1.4–7.7)
Neutrophils Relative %: 60.9 % (ref 43.0–77.0)
Platelets: 400 10*3/uL (ref 150.0–400.0)
RBC: 4.23 Mil/uL (ref 3.87–5.11)
RDW: 16.1 % — ABNORMAL HIGH (ref 11.5–14.6)
WBC: 10.4 10*3/uL (ref 4.5–10.5)

## 2011-11-19 LAB — IBC PANEL
Iron: 36 ug/dL — ABNORMAL LOW (ref 42–145)
Saturation Ratios: 11.3 % — ABNORMAL LOW (ref 20.0–50.0)
Transferrin: 227.7 mg/dL (ref 212.0–360.0)

## 2011-11-19 LAB — FERRITIN: Ferritin: 63 ng/mL (ref 10.0–291.0)

## 2011-11-19 MED ORDER — HYDROCOD POLST-CPM POLST ER 10-8 MG PO CP12: 1.0000 | ORAL_CAPSULE | Freq: Two times a day (BID) | ORAL | Status: DC | PRN

## 2011-11-19 MED ORDER — AZITHROMYCIN 500 MG PO TABS: 500.0000 mg | ORAL_TABLET | Freq: Every day | ORAL | Status: AC

## 2011-11-19 NOTE — Assessment & Plan Note (Signed)
She is already using an ICS and a beta-agonist so I gave her an injection of steroids as well

## 2011-11-19 NOTE — Assessment & Plan Note (Signed)
I will check a CXR to see if she has PNA, edema, mass, etc.

## 2011-11-19 NOTE — Assessment & Plan Note (Addendum)
Start zpak for the infection and tussicaps for the cough  Late note CXR is + for PNA so I will add ceftin to the zpak

## 2011-11-19 NOTE — Progress Notes (Signed)
Subjective:    Patient ID: Gail Payne, female    DOB: 07-16-1971, 40 y.o.   MRN: [id number]  Cough This is a new problem. Episode onset: for 5 days. The problem has been gradually worsening. The problem occurs every few minutes. The cough is productive of purulent sputum. Associated symptoms include chills, a sore throat, sweats and wheezing. Pertinent negatives include no chest pain, ear congestion, ear pain, fever, headaches, heartburn, hemoptysis, myalgias, nasal congestion, postnasal drip, rash, rhinorrhea, shortness of breath or weight loss. The symptoms are aggravated by cold air. She has tried OTC cough suppressant, a beta-agonist inhaler and steroid inhaler for the symptoms. The treatment provided moderate relief. Her past medical history is significant for asthma.  Anemia Presents for follow-up visit. Symptoms include pallor. There has been no abdominal pain, anorexia, bruising/bleeding easily, confusion, fever, leg swelling, light-headedness, malaise/fatigue, palpitations, paresthesias, pica or weight loss. Signs of blood loss that are not present include hematemesis, hematochezia, melena and vaginal bleeding. There are no compliance problems.  Compliance with medications is 76-100%.      Review of Systems  Constitutional: Positive for chills. Negative for fever, weight loss, malaise/fatigue, diaphoresis, activity change, appetite change, fatigue and unexpected weight change.  HENT: Positive for sore throat. Negative for ear pain, congestion, rhinorrhea, sneezing, trouble swallowing, voice change, postnasal drip and sinus pressure.   Eyes: Negative.   Respiratory: Positive for cough and wheezing. Negative for hemoptysis, chest tightness, shortness of breath and stridor.   Cardiovascular: Negative for chest pain, palpitations and leg swelling.  Gastrointestinal: Negative for heartburn, nausea, vomiting, abdominal pain, diarrhea, constipation, blood in stool, melena, hematochezia,  abdominal distention, anal bleeding, rectal pain, anorexia and hematemesis.  Genitourinary: Negative.  Negative for vaginal bleeding.  Musculoskeletal: Negative for myalgias, back pain, joint swelling, arthralgias and gait problem.  Skin: Positive for pallor. Negative for color change, rash and wound.  Neurological: Negative for dizziness, tremors, seizures, syncope, facial asymmetry, speech difficulty, weakness, light-headedness, numbness, headaches and paresthesias.  Hematological: Negative for adenopathy. Does not bruise/bleed easily.  Psychiatric/Behavioral: Negative.  Negative for confusion.       Objective:   Physical Exam  Vitals reviewed. Constitutional: She is oriented to person, place, and time. She appears well-developed and well-nourished. No distress.  HENT:  Head: Normocephalic and atraumatic. No trismus in the jaw.  Right Ear: Hearing, tympanic membrane, external ear and ear canal normal.  Left Ear: Hearing, tympanic membrane, external ear and ear canal normal.  Nose: Nose normal. No mucosal edema, rhinorrhea, nose lacerations, sinus tenderness, nasal deformity, septal deviation or nasal septal hematoma. No epistaxis.  No foreign bodies. Right sinus exhibits no maxillary sinus tenderness and no frontal sinus tenderness. Left sinus exhibits no maxillary sinus tenderness and no frontal sinus tenderness.  Mouth/Throat: Oropharynx is clear and moist. Mucous membranes are pale, not dry and not cyanotic. No uvula swelling. No oropharyngeal exudate, posterior oropharyngeal edema, posterior oropharyngeal erythema or tonsillar abscesses.  Eyes: Conjunctivae are normal. Right eye exhibits no discharge. Left eye exhibits no discharge. No scleral icterus.  Neck: Normal range of motion. Neck supple. No JVD present. No tracheal deviation present. No thyromegaly present.  Cardiovascular: Normal rate, regular rhythm, normal heart sounds and intact distal pulses.  Exam reveals no gallop and no  friction rub.   No murmur heard. Pulmonary/Chest: Effort normal. No accessory muscle usage or stridor. Not tachypneic. No respiratory distress. She has no decreased breath sounds. She has wheezes in the right middle field and the left middle  field. She has rhonchi in the right middle field and the left middle field. She has no rales. She exhibits no tenderness.  Abdominal: Soft. Bowel sounds are normal. She exhibits no distension and no mass. There is no tenderness. There is no rebound and no guarding.  Musculoskeletal: Normal range of motion. She exhibits no edema and no tenderness.  Lymphadenopathy:    She has no cervical adenopathy.  Neurological: She is oriented to person, place, and time.  Skin: Skin is warm and dry. No rash noted. She is not diaphoretic. No erythema. No pallor.  Psychiatric: She has a normal mood and affect. Her behavior is normal. Judgment and thought content normal.      Lab Results  Component Value Date   WBC 9.6 09/26/2011   HGB 11.4* 09/26/2011   HCT 35.3* 09/26/2011   PLT 391.0 09/26/2011   GLUCOSE 92 01/04/2011   CHOL 156 01/04/2011   TRIG 51.0 01/04/2011   HDL 36.40* 01/04/2011   LDLCALC 109* 01/04/2011   ALT 14 09/05/2008   AST 25 09/05/2008   NA 138 01/04/2011   K 4.7 01/04/2011   CL 103 01/04/2011   CREATININE 0.6 01/04/2011   BUN 12 01/04/2011   CO2 29 01/04/2011   TSH 0.97 01/04/2011      Assessment & Plan:

## 2011-11-19 NOTE — Assessment & Plan Note (Signed)
Continue current inhalers

## 2011-11-19 NOTE — Assessment & Plan Note (Signed)
I will recheck her CBC and her iron studies today

## 2011-11-19 NOTE — Patient Instructions (Signed)
Iron Deficiency Anemia There are many types of anemia. Iron deficiency anemia is the most common. Iron deficiency anemia is a decrease in the number of red blood cells caused by too little iron. Without enough iron, your body does not produce enough hemoglobin. Hemoglobin is a substance in red blood cells that carries oxygen to the body's tissues. Iron deficiency anemia may leave you tired and short of breath. CAUSES   Lack of iron in the diet.   This may be seen in infants and children, because there is little iron in milk.   This may be seen in adults who do not eat enough iron-rich foods.   This may be seen in pregnant or breastfeeding women who do not take iron supplements. There is a much higher need for iron intake at these times.   Poor absorption of iron, as seen with intestinal disorders.   Intestinal bleeding.   Heavy periods.  SYMPTOMS  Mild anemia may not be noticeable. Symptoms may include:  Fatigue.   Headache.   Pale skin.   Weakness.   Shortness of breath.   Dizziness.   Cold hands and feet.   Fast or irregular heartbeat.  DIAGNOSIS  Diagnosis requires a thorough evaluation and physical exam by your caregiver.  Blood tests are generally used to confirm iron deficiency anemia.   Additional tests may be done to find the underlying cause of your anemia. These may include:   Testing for blood in the stool (fecal occult blood test).   A procedure to see inside the colon and rectum (colonoscopy).   A procedure to see inside the esophagus and stomach (endoscopy).  TREATMENT   Correcting the cause of the iron deficiency is the first step.   Medicines, such as oral contraceptives, can make heavy menstrual flows lighter.   Antibiotics and other medicines can be used to treat peptic ulcers.   Surgery may be needed to remove a bleeding polyp, tumor, or fibroid.   Often, iron supplements (ferrous sulfate) are taken.   For the best iron absorption, take  these supplements with an empty stomach.   You may need to take the supplements with food if you cannot tolerate them on an empty stomach. Vitamin C improves the absorption of iron. Your caregiver may recommend taking your iron tablets with a glass of orange juice or vitamin C supplement.   Milk and antacids should not be taken at the same time as iron supplements. They may interfere with the absorption of iron.   Iron supplements can cause constipation. A stool softener is often recommended.   Pregnant and breastfeeding women will need to take extra iron, because their normal diet usually will not provide the required amount.   Patients who cannot tolerate iron by mouth can take it through a vein (intravenously) or by an injection into the muscle.  HOME CARE INSTRUCTIONS   Ask your dietitian for help with diet questions.   Take iron and vitamins as directed by your caregiver.   Eat a diet rich in iron. Eat liver, lean beef, whole-grain bread, eggs, dried fruit, and dark green leafy vegetables.  SEEK IMMEDIATE MEDICAL CARE IF:   You have a fainting episode. Do not drive yourself. Call your local emergency services (911 in [place] if no other help is available.   You have chest pain, nausea, or vomiting.   You develop severe or increased shortness of breath with activities.   You develop weakness or increased thirst.   You have   a rapid heartbeat.   You develop unexplained sweating or become lightheaded when getting up from a chair or bed.  MAKE SURE YOU:   Understand these instructions.   Will watch your condition.   Will get help right away if you are not doing well or get worse.  Document Released: 11/22/2000 Document Revised: 08/07/2011 Document Reviewed: 04/03/2010 [facility] Patient Information 2012 [place], [place].Asthma, Adult Asthma is caused by narrowing of the air passages in the lungs. It may be triggered by pollen, dust, animal dander, molds, some foods, respiratory  infections, exposure to smoke, exercise, emotional stress or other allergens (things that cause allergic reactions or allergies). Repeat attacks are common. HOME CARE INSTRUCTIONS   Use prescription medications as ordered by your caregiver.   Avoid pollen, dust, animal dander, molds, smoke and other things that cause attacks at home and at work.   You may have fewer attacks if you decrease dust in your home. Electrostatic air cleaners may help.   It may help to replace your pillows or mattress with materials less likely to cause allergies.   Talk to your caregiver about an action plan for managing asthma attacks at home, including, the use of a peak flow meter which measures the severity of your asthma attack. An action plan can help minimize or stop the attack without having to seek medical care.   If you are not on a fluid restriction, drink 8 to 10 glasses of water each day.   Always have a plan prepared for seeking medical attention, including, calling your physician, accessing local emergency care, and calling 911 (in the [place] for a severe attack.   Discuss possible exercise routines with your caregiver.   If animal dander is the cause of asthma, you may need to get rid of pets.  SEEK MEDICAL CARE IF:   You have wheezing and shortness of breath even if taking medicine to prevent attacks.   You have muscle aches, chest pain or thickening of sputum.   Your sputum changes from clear or white to yellow, green, gray, or bloody.   You have any problems that may be related to the medicine you are taking (such as a rash, itching, swelling or trouble breathing).  SEEK IMMEDIATE MEDICAL CARE IF:   Your usual medicines do not stop your wheezing or there is increased coughing and/or shortness of breath.   You have increased difficulty breathing.   You have a fever.  MAKE SURE YOU:   Understand these instructions.   Will watch your condition.   Will get help right away if you are  not doing well or get worse.  Document Released: 11/25/2005 Document Revised: 08/07/2011 Document Reviewed: 07/13/2008 [facility] Patient Information 2012 [place], [place].

## 2011-11-20 ENCOUNTER — Encounter: Payer: Self-pay | Admitting: Internal Medicine

## 2011-11-20 DIAGNOSIS — J189 Pneumonia, unspecified organism: Secondary | ICD-10-CM | POA: Insufficient documentation

## 2011-11-20 MED ORDER — METHYLPREDNISOLONE ACETATE 80 MG/ML IJ SUSP
120.0000 mg | Freq: Once | INTRAMUSCULAR | Status: AC
  Administered 2011-11-20: 120 mg via INTRAMUSCULAR

## 2011-11-20 MED ORDER — CEFUROXIME AXETIL 500 MG PO TABS: 500.0000 mg | ORAL_TABLET | Freq: Two times a day (BID) | ORAL | Status: AC

## 2011-11-20 NOTE — Progress Notes (Signed)
Addended by: Rock Nephew T on: 11/20/2011 09:51 AM   Modules accepted: Orders

## 2011-11-20 NOTE — Assessment & Plan Note (Signed)
zpak and ceftin

## 2011-11-20 NOTE — Progress Notes (Signed)
Addended by: Etta Grandchild on: 11/20/2011 07:51 AM   Modules accepted: Orders

## 2011-11-22 ENCOUNTER — Telehealth: Payer: Self-pay

## 2011-11-22 NOTE — Telephone Encounter (Signed)
Patient notified

## 2011-11-22 NOTE — Telephone Encounter (Signed)
Her CXR showed pneumonia so she needs double antibiotic coverage and a recheck next week

## 2011-11-22 NOTE — Telephone Encounter (Signed)
Patient called [facility] stating that at office visit she was given rx for zpak, she was recently contacted by pharmacy to pick up a second antibiotic. I did see ceftin listed on medication list , please advise

## 2011-11-25 ENCOUNTER — Ambulatory Visit: Payer: 59

## 2011-11-28 ENCOUNTER — Ambulatory Visit (INDEPENDENT_AMBULATORY_CARE_PROVIDER_SITE_OTHER): Payer: 59

## 2011-11-28 DIAGNOSIS — E538 Deficiency of other specified B group vitamins: Secondary | ICD-10-CM

## 2011-11-28 DIAGNOSIS — D519 Vitamin B12 deficiency anemia, unspecified: Secondary | ICD-10-CM

## 2011-11-28 DIAGNOSIS — D518 Other vitamin B12 deficiency anemias: Secondary | ICD-10-CM

## 2011-11-28 MED ORDER — CYANOCOBALAMIN 1000 MCG/ML IJ SOLN
1000.0000 ug | Freq: Once | INTRAMUSCULAR | Status: AC
  Administered 2011-11-28: 1000 ug via INTRAMUSCULAR

## 2011-12-12 ENCOUNTER — Ambulatory Visit: Payer: 59 | Admitting: Internal Medicine

## 2011-12-20 ENCOUNTER — Ambulatory Visit: Payer: 59 | Admitting: Internal Medicine

## 2012-01-06 ENCOUNTER — Encounter: Payer: 59 | Admitting: Internal Medicine

## 2012-01-06 DIAGNOSIS — Z0289 Encounter for other administrative examinations: Secondary | ICD-10-CM

## 2012-01-17 ENCOUNTER — Encounter: Payer: 59 | Admitting: Internal Medicine

## 2012-01-23 ENCOUNTER — Ambulatory Visit: Payer: 59 | Admitting: Internal Medicine

## 2012-01-31 ENCOUNTER — Encounter: Payer: Self-pay | Admitting: Internal Medicine

## 2012-01-31 ENCOUNTER — Other Ambulatory Visit (INDEPENDENT_AMBULATORY_CARE_PROVIDER_SITE_OTHER): Payer: 59

## 2012-01-31 ENCOUNTER — Ambulatory Visit (INDEPENDENT_AMBULATORY_CARE_PROVIDER_SITE_OTHER): Payer: 59 | Admitting: Internal Medicine

## 2012-01-31 VITALS — BP 118/68 | HR 68 | Temp 97.2°F | Resp 16 | Wt 262.0 lb

## 2012-01-31 DIAGNOSIS — D519 Vitamin B12 deficiency anemia, unspecified: Secondary | ICD-10-CM

## 2012-01-31 DIAGNOSIS — Z0000 Encounter for general adult medical examination without abnormal findings: Secondary | ICD-10-CM

## 2012-01-31 DIAGNOSIS — M1710 Unilateral primary osteoarthritis, unspecified knee: Secondary | ICD-10-CM

## 2012-01-31 LAB — COMPREHENSIVE METABOLIC PANEL
ALT: 17 U/L (ref 0–35)
AST: 18 U/L (ref 0–37)
Albumin: 3.8 g/dL (ref 3.5–5.2)
Alkaline Phosphatase: 61 U/L (ref 39–117)
BUN: 13 mg/dL (ref 6–23)
CO2: 27 mEq/L (ref 19–32)
Calcium: 9.9 mg/dL (ref 8.4–10.5)
Chloride: 103 mEq/L (ref 96–112)
Creatinine, Ser: 0.7 mg/dL (ref 0.4–1.2)
GFR: 120.6 mL/min (ref >60.00)
Glucose, Bld: 97 mg/dL (ref 70–99)
Potassium: 4.1 mEq/L (ref 3.5–5.1)
Sodium: 136 mEq/L (ref 135–145)
Total Bilirubin: 0.3 mg/dL (ref 0.3–1.2)
Total Protein: 7.7 g/dL (ref 6.0–8.3)

## 2012-01-31 LAB — CBC WITH DIFFERENTIAL/PLATELET
Basophils Absolute: 0 10*3/uL (ref 0.0–0.1)
Basophils Relative: 0.5 % (ref 0.0–3.0)
Eosinophils Absolute: 0.2 10*3/uL (ref 0.0–0.7)
Eosinophils Relative: 1.7 % (ref 0.0–5.0)
HCT: 36.4 % (ref 36.0–46.0)
Hemoglobin: 11.8 g/dL — ABNORMAL LOW (ref 12.0–15.0)
Lymphocytes Relative: 28.9 % (ref 12.0–46.0)
Lymphs Abs: 3.1 10*3/uL (ref 0.7–4.0)
MCHC: 32.4 g/dL (ref 30.0–36.0)
MCV: 83.3 fl (ref 78.0–100.0)
Monocytes Absolute: 0.5 10*3/uL (ref 0.1–1.0)
Monocytes Relative: 4.7 % (ref 3.0–12.0)
Neutro Abs: 6.8 10*3/uL (ref 1.4–7.7)
Neutrophils Relative %: 64.2 % (ref 43.0–77.0)
Platelets: 384 10*3/uL (ref 150.0–400.0)
RBC: 4.37 Mil/uL (ref 3.87–5.11)
RDW: 16.5 % — ABNORMAL HIGH (ref 11.5–14.6)
WBC: 10.7 10*3/uL — ABNORMAL HIGH (ref 4.5–10.5)

## 2012-01-31 LAB — LIPID PANEL
Cholesterol: 157 mg/dL (ref 0–200)
HDL: 41.6 mg/dL (ref >39.00)
LDL Cholesterol: 97 mg/dL (ref 0–99)
Total CHOL/HDL Ratio: 4
Triglycerides: 92 mg/dL (ref 0.0–149.0)
VLDL: 18.4 mg/dL (ref 0.0–40.0)

## 2012-01-31 MED ORDER — NAPROXEN 375 MG PO TABS: 375.0000 mg | ORAL_TABLET | Freq: Two times a day (BID) | ORAL | Status: DC

## 2012-01-31 MED ORDER — CYANOCOBALAMIN 500 MCG/0.1ML NA SOLN: 0.1000 mL | NASAL | Status: DC

## 2012-01-31 NOTE — Patient Instructions (Signed)

## 2012-01-31 NOTE — Assessment & Plan Note (Signed)
Exam done, labs ordered, pt ed material was given 

## 2012-01-31 NOTE — Assessment & Plan Note (Signed)
Change to nascobal ns to treat the B12 deficiency for better convenience

## 2012-01-31 NOTE — Progress Notes (Signed)
  Subjective:    Patient ID: Gail Payne, female    DOB: 04/17/1971, 41 y.o.   MRN: [id number]  HPI  She returns for a complete physical and complains of persistent right knee pain and has been out of naproxen for a while, she has tried PT without much success, she wants to see an ortho doctor.  Review of Systems  Constitutional: Negative for fever, chills, diaphoresis, activity change, appetite change, fatigue and unexpected weight change.  HENT: Negative.   Eyes: Negative.   Respiratory: Negative for cough, chest tightness, shortness of breath, wheezing and stridor.   Cardiovascular: Negative for chest pain, palpitations and leg swelling.  Gastrointestinal: Negative.   Genitourinary: Negative.   Musculoskeletal: Positive for arthralgias (right knee). Negative for myalgias, back pain, joint swelling and gait problem.  Skin: Negative.   Neurological: Negative for dizziness, tremors, seizures, syncope, facial asymmetry, speech difficulty, weakness, light-headedness, numbness and headaches.  Hematological: Negative for adenopathy. Does not bruise/bleed easily.  Psychiatric/Behavioral: Negative.        Objective:   Physical Exam  Vitals reviewed. Constitutional: She is oriented to person, place, and time. She appears well-developed and well-nourished. No distress.  HENT:  Head: Normocephalic and atraumatic.  Mouth/Throat: Oropharynx is clear and moist. No oropharyngeal exudate.  Eyes: Conjunctivae are normal. Right eye exhibits no discharge. Left eye exhibits no discharge. No scleral icterus.  Neck: Normal range of motion. Neck supple. No JVD present. No tracheal deviation present. No thyromegaly present.  Cardiovascular: Normal rate, regular rhythm, normal heart sounds and intact distal pulses.  Exam reveals no gallop and no friction rub.   No murmur heard. Pulmonary/Chest: Effort normal and breath sounds normal. No stridor. No respiratory distress. She has no decreased breath  sounds. She has no wheezes. She has no rhonchi. She has no rales. Chest wall is not dull to percussion. She exhibits no mass, no tenderness, no bony tenderness, no laceration, no crepitus, no edema, no deformity, no swelling and no retraction. Right breast exhibits no inverted nipple, no mass, no nipple discharge, no skin change and no tenderness. Left breast exhibits no inverted nipple, no mass, no nipple discharge, no skin change and no tenderness. Breasts are symmetrical.  Abdominal: Soft. Bowel sounds are normal. She exhibits no distension and no mass. There is no tenderness. There is no rebound and no guarding.  Musculoskeletal: Normal range of motion. She exhibits no edema and no tenderness.  Lymphadenopathy:    She has no cervical adenopathy.  Neurological: She is oriented to person, place, and time.  Skin: Skin is warm and dry. No rash noted. She is not diaphoretic. No erythema. No pallor.  Psychiatric: She has a normal mood and affect. Her behavior is normal. Judgment and thought content normal.      Lab Results  Component Value Date   WBC 10.4 11/19/2011   HGB 11.4* 11/19/2011   HCT 34.6* 11/19/2011   PLT 400.0 11/19/2011   GLUCOSE 92 01/04/2011   CHOL 156 01/04/2011   TRIG 51.0 01/04/2011   HDL 36.40* 01/04/2011   LDLCALC 109* 01/04/2011   ALT 14 09/05/2008   AST 25 09/05/2008   NA 138 01/04/2011   K 4.7 01/04/2011   CL 103 01/04/2011   CREATININE 0.6 01/04/2011   BUN 12 01/04/2011   CO2 29 01/04/2011   TSH 0.97 01/04/2011      Assessment & Plan:

## 2012-01-31 NOTE — Assessment & Plan Note (Signed)
Restart naprosyn and offer an ortho referral

## 2012-02-07 ENCOUNTER — Telehealth: Payer: Self-pay | Admitting: Internal Medicine

## 2012-02-07 MED ORDER — TRAMADOL HCL 50 MG PO TABS: 50.0000 mg | ORAL_TABLET | Freq: Three times a day (TID) | ORAL | Status: AC | PRN

## 2012-02-07 NOTE — Telephone Encounter (Signed)
Patient notified

## 2012-02-07 NOTE — Telephone Encounter (Signed)
done

## 2012-02-07 NOTE — Telephone Encounter (Signed)
Pt requesting another med--Naproxen is giving her side effects--[facility]  [place]--Pt [phone]

## 2012-04-03 ENCOUNTER — Ambulatory Visit: Payer: 59

## 2012-04-07 ENCOUNTER — Telehealth: Payer: Self-pay | Admitting: Internal Medicine

## 2012-04-07 ENCOUNTER — Ambulatory Visit: Payer: 59

## 2012-04-07 NOTE — Telephone Encounter (Signed)
Pt was going to donate blood at work.  They wouldn't let her because her iron was low.  She cx'd her B-12.  She wants to know if she needs lab work before going on the B-12 nasal spray.

## 2012-04-07 NOTE — Telephone Encounter (Signed)
PT MADE AN APPT FOR Friday MAY 3.

## 2012-04-07 NOTE — Telephone Encounter (Signed)
Yes, she needs an appt

## 2012-04-10 ENCOUNTER — Other Ambulatory Visit (INDEPENDENT_AMBULATORY_CARE_PROVIDER_SITE_OTHER): Payer: 59

## 2012-04-10 ENCOUNTER — Encounter: Payer: Self-pay | Admitting: Internal Medicine

## 2012-04-10 ENCOUNTER — Ambulatory Visit (INDEPENDENT_AMBULATORY_CARE_PROVIDER_SITE_OTHER): Payer: 59 | Admitting: Internal Medicine

## 2012-04-10 VITALS — BP 132/80 | HR 62 | Temp 97.8°F | Resp 16 | Wt 260.0 lb

## 2012-04-10 DIAGNOSIS — J4530 Mild persistent asthma, uncomplicated: Secondary | ICD-10-CM

## 2012-04-10 DIAGNOSIS — D519 Vitamin B12 deficiency anemia, unspecified: Secondary | ICD-10-CM

## 2012-04-10 DIAGNOSIS — D518 Other vitamin B12 deficiency anemias: Secondary | ICD-10-CM

## 2012-04-10 DIAGNOSIS — D509 Iron deficiency anemia, unspecified: Secondary | ICD-10-CM

## 2012-04-10 DIAGNOSIS — J45909 Unspecified asthma, uncomplicated: Secondary | ICD-10-CM

## 2012-04-10 LAB — CBC WITH DIFFERENTIAL/PLATELET
Basophils Absolute: 0.1 10*3/uL (ref 0.0–0.1)
Basophils Relative: 0.5 % (ref 0.0–3.0)
Eosinophils Absolute: 0.2 10*3/uL (ref 0.0–0.7)
Eosinophils Relative: 2 % (ref 0.0–5.0)
HCT: 37.2 % (ref 36.0–46.0)
Hemoglobin: 11.9 g/dL — ABNORMAL LOW (ref 12.0–15.0)
Lymphocytes Relative: 28.4 % (ref 12.0–46.0)
Lymphs Abs: 3.2 10*3/uL (ref 0.7–4.0)
MCHC: 31.9 g/dL (ref 30.0–36.0)
MCV: 83.6 fl (ref 78.0–100.0)
Monocytes Absolute: 0.4 10*3/uL (ref 0.1–1.0)
Monocytes Relative: 3.7 % (ref 3.0–12.0)
Neutro Abs: 7.4 10*3/uL (ref 1.4–7.7)
Neutrophils Relative %: 65.4 % (ref 43.0–77.0)
Platelets: 363 10*3/uL (ref 150.0–400.0)
RBC: 4.45 Mil/uL (ref 3.87–5.11)
RDW: 16.1 % — ABNORMAL HIGH (ref 11.5–14.6)
WBC: 11.3 10*3/uL — ABNORMAL HIGH (ref 4.5–10.5)

## 2012-04-10 LAB — IBC PANEL
Iron: 29 ug/dL — ABNORMAL LOW (ref 42–145)
Saturation Ratios: 9.3 % — ABNORMAL LOW (ref 20.0–50.0)
Transferrin: 222.4 mg/dL (ref 212.0–360.0)

## 2012-04-10 LAB — FERRITIN: Ferritin: 47.9 ng/mL (ref 10.0–291.0)

## 2012-04-10 LAB — FOLATE: Folate: 13 ng/mL (ref >5.9)

## 2012-04-10 MED ORDER — CYANOCOBALAMIN 1000 MCG/ML IJ SOLN
1000.0000 ug | Freq: Once | INTRAMUSCULAR | Status: AC
  Administered 2012-04-10: 1000 ug via INTRAMUSCULAR

## 2012-04-10 NOTE — Progress Notes (Signed)
  Subjective:    Patient ID: Gail Payne, female    DOB: 27-Jun-1971, 41 y.o.   MRN: [id number]  Anemia Presents for follow-up visit. Symptoms include malaise/fatigue and pallor. There has been no abdominal pain, anorexia, bruising/bleeding easily, confusion, fever, leg swelling, light-headedness, palpitations, paresthesias, pica or weight loss. Signs of blood loss that are not present include hematemesis, hematochezia, melena, menorrhagia and vaginal bleeding. There are no compliance problems.  Compliance with medications is 76-100%.      Review of Systems  Constitutional: Positive for malaise/fatigue. Negative for fever and weight loss.  HENT: Negative.   Eyes: Negative.   Respiratory: Negative for cough, chest tightness, wheezing and stridor.   Cardiovascular: Negative for chest pain, palpitations and leg swelling.  Gastrointestinal: Negative for nausea, vomiting, abdominal pain, diarrhea, constipation, blood in stool, melena, hematochezia, abdominal distention, anal bleeding, rectal pain, anorexia and hematemesis.  Genitourinary: Negative.  Negative for vaginal bleeding and menorrhagia.  Musculoskeletal: Negative for myalgias, back pain, joint swelling, arthralgias and gait problem.  Skin: Positive for pallor. Negative for color change and wound.  Neurological: Negative for dizziness, tremors, seizures, syncope, facial asymmetry, speech difficulty, weakness, light-headedness, numbness, headaches and paresthesias.  Hematological: Negative for adenopathy. Does not bruise/bleed easily.  Psychiatric/Behavioral: Negative.  Negative for confusion.       Objective:   Physical Exam  Vitals reviewed. Constitutional: She is oriented to person, place, and time. She appears well-developed and well-nourished. No distress.  HENT:  Head: Normocephalic and atraumatic.  Mouth/Throat: Oropharynx is clear and moist. No oropharyngeal exudate.  Eyes: Conjunctivae are normal. Right eye exhibits no  discharge. Left eye exhibits no discharge. No scleral icterus.  Neck: Normal range of motion. Neck supple. No JVD present. No tracheal deviation present. No thyromegaly present.  Cardiovascular: Normal rate, regular rhythm, normal heart sounds and intact distal pulses.  Exam reveals no gallop and no friction rub.   No murmur heard. Pulmonary/Chest: Effort normal and breath sounds normal. No stridor. No respiratory distress. She has no wheezes. She has no rales. She exhibits no tenderness.  Abdominal: Soft. Bowel sounds are normal. She exhibits no distension and no mass. There is no tenderness. There is no rebound and no guarding.  Musculoskeletal: Normal range of motion. She exhibits no edema and no tenderness.  Lymphadenopathy:    She has no cervical adenopathy.  Neurological: She is oriented to person, place, and time.  Skin: Skin is warm and dry. No rash noted. She is not diaphoretic. No erythema. No pallor.  Psychiatric: She has a normal mood and affect. Her behavior is normal. Judgment and thought content normal.     Lab Results  Component Value Date   WBC 10.7* 01/31/2012   HGB 11.8* 01/31/2012   HCT 36.4 01/31/2012   PLT 384.0 01/31/2012   GLUCOSE 97 01/31/2012   CHOL 157 01/31/2012   TRIG 92.0 01/31/2012   HDL 41.60 01/31/2012   LDLCALC 97 01/31/2012   ALT 17 01/31/2012   AST 18 01/31/2012   NA 136 01/31/2012   K 4.1 01/31/2012   CL 103 01/31/2012   CREATININE 0.7 01/31/2012   BUN 13 01/31/2012   CO2 27 01/31/2012   TSH 0.97 01/04/2011       Assessment & Plan:

## 2012-04-10 NOTE — Patient Instructions (Signed)

## 2012-04-12 NOTE — Assessment & Plan Note (Signed)
She is doing well on her current inhalers 

## 2012-04-12 NOTE — Assessment & Plan Note (Signed)
I will recheck her CBC and her iron levels 

## 2012-05-11 ENCOUNTER — Ambulatory Visit: Payer: 59

## 2012-05-11 ENCOUNTER — Ambulatory Visit (INDEPENDENT_AMBULATORY_CARE_PROVIDER_SITE_OTHER): Payer: 59 | Admitting: *Deleted

## 2012-05-11 DIAGNOSIS — D509 Iron deficiency anemia, unspecified: Secondary | ICD-10-CM

## 2012-05-11 DIAGNOSIS — D519 Vitamin B12 deficiency anemia, unspecified: Secondary | ICD-10-CM

## 2012-05-11 DIAGNOSIS — D518 Other vitamin B12 deficiency anemias: Secondary | ICD-10-CM

## 2012-05-11 MED ORDER — CYANOCOBALAMIN 1000 MCG/ML IJ SOLN
1000.0000 ug | Freq: Once | INTRAMUSCULAR | Status: AC
  Administered 2012-05-11: 1000 ug via INTRAMUSCULAR

## 2012-06-12 ENCOUNTER — Ambulatory Visit: Payer: 59 | Admitting: Internal Medicine

## 2012-07-10 ENCOUNTER — Ambulatory Visit (INDEPENDENT_AMBULATORY_CARE_PROVIDER_SITE_OTHER): Payer: 59 | Admitting: *Deleted

## 2012-07-10 DIAGNOSIS — D509 Iron deficiency anemia, unspecified: Secondary | ICD-10-CM

## 2012-07-10 MED ORDER — CYANOCOBALAMIN 1000 MCG/ML IJ SOLN
1000.0000 ug | Freq: Once | INTRAMUSCULAR | Status: AC
  Administered 2012-07-10: 1000 ug via INTRAMUSCULAR

## 2012-08-14 ENCOUNTER — Other Ambulatory Visit (INDEPENDENT_AMBULATORY_CARE_PROVIDER_SITE_OTHER): Payer: 59

## 2012-08-14 ENCOUNTER — Ambulatory Visit (INDEPENDENT_AMBULATORY_CARE_PROVIDER_SITE_OTHER): Payer: 59 | Admitting: Internal Medicine

## 2012-08-14 ENCOUNTER — Ambulatory Visit: Payer: 59

## 2012-08-14 ENCOUNTER — Encounter: Payer: Self-pay | Admitting: Internal Medicine

## 2012-08-14 VITALS — BP 118/68 | HR 80 | Temp 97.7°F | Resp 16 | Wt 260.0 lb

## 2012-08-14 DIAGNOSIS — D519 Vitamin B12 deficiency anemia, unspecified: Secondary | ICD-10-CM

## 2012-08-14 DIAGNOSIS — D509 Iron deficiency anemia, unspecified: Secondary | ICD-10-CM

## 2012-08-14 DIAGNOSIS — M1710 Unilateral primary osteoarthritis, unspecified knee: Secondary | ICD-10-CM

## 2012-08-14 DIAGNOSIS — D518 Other vitamin B12 deficiency anemias: Secondary | ICD-10-CM

## 2012-08-14 DIAGNOSIS — IMO0002 Reserved for concepts with insufficient information to code with codable children: Secondary | ICD-10-CM

## 2012-08-14 DIAGNOSIS — M546 Pain in thoracic spine: Secondary | ICD-10-CM | POA: Insufficient documentation

## 2012-08-14 LAB — CBC WITH DIFFERENTIAL/PLATELET
Basophils Absolute: 0 10*3/uL (ref 0.0–0.1)
Basophils Relative: 0.1 % (ref 0.0–3.0)
Eosinophils Absolute: 0.2 10*3/uL (ref 0.0–0.7)
Eosinophils Relative: 1.7 % (ref 0.0–5.0)
HCT: 36.8 % (ref 36.0–46.0)
Hemoglobin: 11.9 g/dL — ABNORMAL LOW (ref 12.0–15.0)
Lymphocytes Relative: 26.5 % (ref 12.0–46.0)
Lymphs Abs: 3 10*3/uL (ref 0.7–4.0)
MCHC: 32.4 g/dL (ref 30.0–36.0)
MCV: 84.4 fl (ref 78.0–100.0)
Monocytes Absolute: 0.4 10*3/uL (ref 0.1–1.0)
Monocytes Relative: 3.6 % (ref 3.0–12.0)
Neutro Abs: 7.8 10*3/uL — ABNORMAL HIGH (ref 1.4–7.7)
Neutrophils Relative %: 68.1 % (ref 43.0–77.0)
Platelets: 360 10*3/uL (ref 150.0–400.0)
RBC: 4.36 Mil/uL (ref 3.87–5.11)
RDW: 15.3 % — ABNORMAL HIGH (ref 11.5–14.6)
WBC: 11.5 10*3/uL — ABNORMAL HIGH (ref 4.5–10.5)

## 2012-08-14 LAB — IBC PANEL
Iron: 48 ug/dL (ref 42–145)
Saturation Ratios: 15.7 % — ABNORMAL LOW (ref 20.0–50.0)
Transferrin: 218.5 mg/dL (ref 212.0–360.0)

## 2012-08-14 LAB — VITAMIN B12: Vitamin B-12: >1500 pg/mL — ABNORMAL HIGH (ref 211–911)

## 2012-08-14 LAB — FERRITIN: Ferritin: 62.7 ng/mL (ref 10.0–291.0)

## 2012-08-14 MED ORDER — IBUPROFEN 600 MG PO TABS: 600.0000 mg | ORAL_TABLET | Freq: Three times a day (TID) | ORAL | Status: AC | PRN

## 2012-08-14 MED ORDER — KETOROLAC TROMETHAMINE 60 MG/2ML IM SOLN
60.0000 mg | Freq: Once | INTRAMUSCULAR | Status: AC
  Administered 2012-08-14: 60 mg via INTRAMUSCULAR

## 2012-08-14 MED ORDER — CYANOCOBALAMIN 1000 MCG/ML IJ SOLN
1000.0000 ug | Freq: Once | INTRAMUSCULAR | Status: AC
  Administered 2012-08-14: 1000 ug via INTRAMUSCULAR
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
## 2012-08-14 NOTE — Patient Instructions (Signed)
Back Pain, Adult Low back pain is very common. About 1 in 5 people have back pain.The cause of low back pain is rarely dangerous. The pain often gets better over time.About half of people with a sudden onset of back pain feel better in just 2 weeks. About 8 in 10 people feel better by 6 weeks.  CAUSES Some common causes of back pain include:  Strain of the muscles or ligaments supporting the spine.   Wear and tear (degeneration) of the spinal discs.   Arthritis.   Direct injury to the back.  DIAGNOSIS Most of the time, the direct cause of low back pain is not known.However, back pain can be treated effectively even when the exact cause of the pain is unknown.Answering your caregiver's questions about your overall health and symptoms is one of the most accurate ways to make sure the cause of your pain is not dangerous. If your caregiver needs more information, he or she may order lab work or imaging tests (X-rays or MRIs).However, even if imaging tests show changes in your back, this usually does not require surgery. HOME CARE INSTRUCTIONS For many people, back pain returns.Since low back pain is rarely dangerous, it is often a condition that people can learn to manageon their own.   Remain active. It is stressful on the back to sit or stand in one place. Do not sit, drive, or stand in one place for more than 30 minutes at a time. Take short walks on level surfaces as soon as pain allows.Try to increase the length of time you walk each day.   Do not stay in bed.Resting more than 1 or 2 days can delay your recovery.   Do not avoid exercise or work.Your body is made to move.It is not dangerous to be active, even though your back may hurt.Your back will likely heal faster if you return to being active before your pain is gone.   Pay attention to your body when you bend and lift. Many people have less discomfortwhen lifting if they bend their knees, keep the load close to their  bodies,and avoid twisting. Often, the most comfortable positions are those that put less stress on your recovering back.   Find a comfortable position to sleep. Use a firm mattress and lie on your side with your knees slightly bent. If you lie on your back, put a pillow under your knees.   Only take over-the-counter or prescription medicines as directed by your caregiver. Over-the-counter medicines to reduce pain and inflammation are often the most helpful.Your caregiver may prescribe muscle relaxant drugs.These medicines help dull your pain so you can more quickly return to your normal activities and healthy exercise.   Put ice on the injured area.   Put ice in a plastic bag.   Place a towel between your skin and the bag.   Leave the ice on for 15 to 20 minutes, 3 to 4 times a day for the first 2 to 3 days. After that, ice and heat may be alternated to reduce pain and spasms.   Ask your caregiver about trying back exercises and gentle massage. This may be of some benefit.   Avoid feeling anxious or stressed.Stress increases muscle tension and can worsen back pain.It is important to recognize when you are anxious or stressed and learn ways to manage it.Exercise is a great option.  SEEK MEDICAL CARE IF:  You have pain that is not relieved with rest or medicine.   You have   pain that does not improve in 1 week.   You have new symptoms.   You are generally not feeling well.  SEEK IMMEDIATE MEDICAL CARE IF:   You have pain that radiates from your back into your legs.   You develop new bowel or bladder control problems.   You have unusual weakness or numbness in your arms or legs.   You develop nausea or vomiting.   You develop abdominal pain.   You feel faint.  Document Released: 11/25/2005 Document Revised: 11/14/2011 Document Reviewed: 04/15/2011 [employer] Patient Information 2012 [employer] 

## 2012-08-17 NOTE — Assessment & Plan Note (Signed)
I will check her CBC and B12 level today 

## 2012-08-17 NOTE — Assessment & Plan Note (Signed)
I will check her CBC and her iron level today 

## 2012-08-17 NOTE — Assessment & Plan Note (Signed)
She has benign symptoms and a normal exam, will treat with nsaids

## 2012-08-20 ENCOUNTER — Ambulatory Visit: Payer: 59

## 2012-08-22 ENCOUNTER — Other Ambulatory Visit: Payer: Self-pay | Admitting: Internal Medicine

## 2012-09-11 ENCOUNTER — Ambulatory Visit (INDEPENDENT_AMBULATORY_CARE_PROVIDER_SITE_OTHER): Payer: 59 | Admitting: Internal Medicine

## 2012-09-11 ENCOUNTER — Encounter: Payer: Self-pay | Admitting: Internal Medicine

## 2012-09-11 VITALS — BP 118/68 | HR 73 | Temp 97.3°F | Resp 16 | Wt 262.0 lb

## 2012-09-11 DIAGNOSIS — D519 Vitamin B12 deficiency anemia, unspecified: Secondary | ICD-10-CM

## 2012-09-11 DIAGNOSIS — Z23 Encounter for immunization: Secondary | ICD-10-CM

## 2012-09-11 DIAGNOSIS — D518 Other vitamin B12 deficiency anemias: Secondary | ICD-10-CM

## 2012-09-11 DIAGNOSIS — D509 Iron deficiency anemia, unspecified: Secondary | ICD-10-CM

## 2012-09-11 MED ORDER — CYANOCOBALAMIN 1000 MCG/ML IJ SOLN
1000.0000 ug | Freq: Once | INTRAMUSCULAR | Status: AC
  Administered 2012-09-11: 1000 ug via INTRAMUSCULAR

## 2012-09-11 NOTE — Patient Instructions (Signed)

## 2012-09-11 NOTE — Progress Notes (Signed)
  Subjective:    Patient ID: Gail Payne, female    DOB: 1971/08/22, 41 y.o.   MRN: [id number]  Anemia Presents for follow-up visit. Symptoms include malaise/fatigue. There has been no abdominal pain, anorexia, bruising/bleeding easily, confusion, fever, leg swelling, light-headedness, pallor, palpitations, paresthesias, pica or weight loss. Signs of blood loss that are not present include hematemesis, hematochezia, melena, menorrhagia and vaginal bleeding. There are no compliance problems.  Compliance with medications is 76-100%.      Review of Systems  Constitutional: Positive for malaise/fatigue. Negative for fever and weight loss.  HENT: Negative.   Eyes: Negative.   Respiratory: Negative.  Negative for shortness of breath.   Cardiovascular: Negative.  Negative for palpitations.  Gastrointestinal: Negative.  Negative for abdominal pain, melena, hematochezia, anorexia and hematemesis.  Genitourinary: Negative.  Negative for vaginal bleeding and menorrhagia.  Musculoskeletal: Negative.   Skin: Negative for pallor.  Neurological: Negative.  Negative for light-headedness and paresthesias.  Hematological: Negative.  Negative for adenopathy. Does not bruise/bleed easily.  Psychiatric/Behavioral: Negative.  Negative for confusion.       Objective:   Physical Exam  Vitals reviewed. Constitutional: She is oriented to person, place, and time. She appears well-developed and well-nourished. No distress.  HENT:  Head: Normocephalic and atraumatic.  Mouth/Throat: Oropharynx is clear and moist. No oropharyngeal exudate.  Eyes: Conjunctivae normal are normal. Right eye exhibits no discharge. Left eye exhibits no discharge. No scleral icterus.  Neck: Normal range of motion. Neck supple. No JVD present. No tracheal deviation present. No thyromegaly present.  Cardiovascular: Normal rate, regular rhythm, normal heart sounds and intact distal pulses.  Exam reveals no gallop and no friction rub.     No murmur heard. Pulmonary/Chest: Effort normal and breath sounds normal. No stridor. No respiratory distress. She has no wheezes. She has no rales. She exhibits no tenderness.  Abdominal: Soft. Bowel sounds are normal. She exhibits no distension and no mass. There is no tenderness. There is no rebound and no guarding.  Musculoskeletal: Normal range of motion. She exhibits no edema and no tenderness.  Lymphadenopathy:    She has no cervical adenopathy.  Neurological: She is oriented to person, place, and time.  Skin: Skin is warm and dry. No rash noted. She is not diaphoretic. No erythema. No pallor.  Psychiatric: She has a normal mood and affect. Her behavior is normal. Judgment and thought content normal.      Lab Results  Component Value Date   WBC 11.5* 08/14/2012   HGB 11.9* 08/14/2012   HCT 36.8 08/14/2012   PLT 360.0 08/14/2012   GLUCOSE 97 01/31/2012   CHOL 157 01/31/2012   TRIG 92.0 01/31/2012   HDL 41.60 01/31/2012   LDLCALC 97 01/31/2012   ALT 17 01/31/2012   AST 18 01/31/2012   NA 136 01/31/2012   K 4.1 01/31/2012   CL 103 01/31/2012   CREATININE 0.7 01/31/2012   BUN 13 01/31/2012   CO2 27 01/31/2012   TSH 0.97 01/04/2011      Assessment & Plan:

## 2012-09-13 NOTE — Assessment & Plan Note (Signed)
She will continue B12 replacement for now

## 2012-09-13 NOTE — Assessment & Plan Note (Signed)
Improvement noted, she will continue the same for now

## 2012-10-19 ENCOUNTER — Ambulatory Visit: Payer: 59

## 2012-10-26 ENCOUNTER — Ambulatory Visit: Payer: 59

## 2012-10-29 ENCOUNTER — Ambulatory Visit (INDEPENDENT_AMBULATORY_CARE_PROVIDER_SITE_OTHER): Payer: 59

## 2012-10-29 DIAGNOSIS — D519 Vitamin B12 deficiency anemia, unspecified: Secondary | ICD-10-CM

## 2012-10-29 DIAGNOSIS — D518 Other vitamin B12 deficiency anemias: Secondary | ICD-10-CM

## 2012-10-29 MED ORDER — CYANOCOBALAMIN 1000 MCG/ML IJ SOLN
1000.0000 ug | Freq: Once | INTRAMUSCULAR | Status: AC
  Administered 2012-10-29: 1000 ug via INTRAMUSCULAR

## 2012-12-21 ENCOUNTER — Ambulatory Visit: Payer: 59

## 2013-01-14 ENCOUNTER — Ambulatory Visit (INDEPENDENT_AMBULATORY_CARE_PROVIDER_SITE_OTHER): Payer: 59

## 2013-01-14 DIAGNOSIS — D518 Other vitamin B12 deficiency anemias: Secondary | ICD-10-CM

## 2013-01-14 DIAGNOSIS — Z23 Encounter for immunization: Secondary | ICD-10-CM

## 2013-01-14 DIAGNOSIS — D519 Vitamin B12 deficiency anemia, unspecified: Secondary | ICD-10-CM

## 2013-01-14 MED ORDER — CYANOCOBALAMIN 1000 MCG/ML IJ SOLN
1000.0000 ug | Freq: Once | INTRAMUSCULAR | Status: AC
  Administered 2013-01-14: 1000 ug via INTRAMUSCULAR

## 2013-02-19 ENCOUNTER — Ambulatory Visit (INDEPENDENT_AMBULATORY_CARE_PROVIDER_SITE_OTHER): Payer: 59

## 2013-02-19 ENCOUNTER — Ambulatory Visit: Payer: 59 | Admitting: Internal Medicine

## 2013-02-19 ENCOUNTER — Ambulatory Visit: Payer: 59

## 2013-02-19 DIAGNOSIS — D509 Iron deficiency anemia, unspecified: Secondary | ICD-10-CM

## 2013-02-19 MED ORDER — CYANOCOBALAMIN 1000 MCG/ML IJ SOLN
1000.0000 ug | Freq: Once | INTRAMUSCULAR | Status: AC
  Administered 2013-02-19: 1000 ug via INTRAMUSCULAR

## 2013-03-06 ENCOUNTER — Other Ambulatory Visit: Payer: Self-pay | Admitting: Internal Medicine

## 2013-03-12 ENCOUNTER — Ambulatory Visit: Payer: 59 | Admitting: Internal Medicine

## 2013-03-12 DIAGNOSIS — Z0289 Encounter for other administrative examinations: Secondary | ICD-10-CM

## 2013-04-23 ENCOUNTER — Ambulatory Visit (INDEPENDENT_AMBULATORY_CARE_PROVIDER_SITE_OTHER): Payer: 59 | Admitting: *Deleted

## 2013-04-23 DIAGNOSIS — D519 Vitamin B12 deficiency anemia, unspecified: Secondary | ICD-10-CM

## 2013-04-23 DIAGNOSIS — D518 Other vitamin B12 deficiency anemias: Secondary | ICD-10-CM

## 2013-04-23 MED ORDER — CYANOCOBALAMIN 1000 MCG/ML IJ SOLN
1000.0000 ug | Freq: Once | INTRAMUSCULAR | Status: AC
  Administered 2013-04-23: 1000 ug via INTRAMUSCULAR

## 2013-05-21 ENCOUNTER — Ambulatory Visit: Payer: 59

## 2013-05-25 ENCOUNTER — Ambulatory Visit: Payer: 59

## 2013-06-10 ENCOUNTER — Ambulatory Visit (INDEPENDENT_AMBULATORY_CARE_PROVIDER_SITE_OTHER): Payer: 59

## 2013-06-10 DIAGNOSIS — D518 Other vitamin B12 deficiency anemias: Secondary | ICD-10-CM

## 2013-06-10 DIAGNOSIS — D519 Vitamin B12 deficiency anemia, unspecified: Secondary | ICD-10-CM

## 2013-06-10 MED ORDER — CYANOCOBALAMIN 1000 MCG/ML IJ SOLN
1000.0000 ug | Freq: Once | INTRAMUSCULAR | Status: AC
  Administered 2013-06-10: 1000 ug via INTRAMUSCULAR

## 2013-06-22 ENCOUNTER — Ambulatory Visit (INDEPENDENT_AMBULATORY_CARE_PROVIDER_SITE_OTHER): Payer: 59 | Admitting: Sports Medicine

## 2013-06-22 VITALS — BP 140/79 | Ht 64.0 in | Wt 260.0 lb

## 2013-06-22 DIAGNOSIS — R269 Unspecified abnormalities of gait and mobility: Secondary | ICD-10-CM

## 2013-06-22 DIAGNOSIS — M79609 Pain in unspecified limb: Secondary | ICD-10-CM

## 2013-06-22 DIAGNOSIS — M79671 Pain in right foot: Secondary | ICD-10-CM | POA: Insufficient documentation

## 2013-06-22 NOTE — Assessment & Plan Note (Signed)
Slowly healing ankle sprain and pes planus. Custom orthotics made today. Suspect correction of underlying foot deformity will speed healing of the sprain.

## 2013-06-22 NOTE — Progress Notes (Signed)
Patient ID: Gail Payne, female   DOB: [birth year], 42 y.o.   MRN: [id number] Subjective: Ms. Gail Payne is here today for a new patient appointment for foot pain. She is referred courtesy of Dr. Salvatore Marvel  She reports that she was sent here for orthotics by Dr. Thurston Hole.  She had a fall where she inverted and plantar flexed the right foot on 06/07/13.  She was Dr. Thurston Hole and was diagnosed with an ankle sprain and given a brace and meloxicam.  Initially, these helped, but now she is have more pain that will wake her up at night.  She followed up with Dr. Thurston Hole who recommended she come here for orthotics as she has flat feet.  The pain is located over the dorsal lateral forefoot primarily.  Worse with inversion and plantar flexion.  She also states that she has bilateral pain in the heels and bottoms of her feet into the metatarsal pads.  This pain is present only after sitting or first thing in the morning and resolves after a few steps.  PMHx: anemia PSHx: arthroscope of the knee FHx: HTN in mother, diabetes in grandmother, heart disease in grandparents and father SHx: non smoker; no alcohol; works as Designer, multimedia help at [employer]  Allergies: none Meds: meloxicam and B12 shots  Objective: BP 140/79  Ht 5\' 4"  (1.626 m)  Wt 260 lb (117.935 kg)  BMI 44.61 kg/m2 Gen: alert, cooperative, NAD Right ankle: no erythema or swelling; no bony tenderness or tenderness along the peroneal tendons; normal ROM; good strength throughout but pain with plantar flexion and inversion Right foot: no erythema or swelling; tender over dorsal lateral midfoot; no heel or arch pain bilaterally  Stance: moderate loss of longitudinal arch bilaterally, worse on the right posteriorly; right foot in external rotation  A/P: 42 yo woman with bilateral loss of longitudinal arch causing slow healing of ankle sprain. See problem list for specifics.

## 2013-06-22 NOTE — Assessment & Plan Note (Signed)
Patient was fitted for a : standard, cushioned, semi-rigid orthotic. The orthotic was heated and afterward the patient stood on the orthotic blank positioned on the orthotic stand. The patient was positioned in subtalar neutral position and 10 degrees of ankle dorsiflexion in a weight bearing stance. After completion of molding, a stable base was applied to the orthotic blank. The blank was ground to a stable position for weight bearing. Size: 9 red EVA Base: blue Medium Posting: none Additional orthotic padding: none  Time 45 mins  Reck prn or for modification of orthotics

## 2013-07-21 ENCOUNTER — Encounter: Payer: 59 | Admitting: Sports Medicine

## 2013-07-30 ENCOUNTER — Ambulatory Visit (INDEPENDENT_AMBULATORY_CARE_PROVIDER_SITE_OTHER): Payer: 59

## 2013-07-30 DIAGNOSIS — D519 Vitamin B12 deficiency anemia, unspecified: Secondary | ICD-10-CM

## 2013-07-30 DIAGNOSIS — D518 Other vitamin B12 deficiency anemias: Secondary | ICD-10-CM

## 2013-07-30 MED ORDER — CYANOCOBALAMIN 1000 MCG/ML IJ SOLN
1000.0000 ug | Freq: Once | INTRAMUSCULAR | Status: AC
  Administered 2013-07-30: 1000 ug via INTRAMUSCULAR

## 2013-08-19 LAB — HM PAP SMEAR: HM Pap smear: NORMAL

## 2013-08-19 LAB — HM MAMMOGRAPHY: HM Mammogram: NORMAL

## 2013-08-25 ENCOUNTER — Other Ambulatory Visit: Payer: Self-pay | Admitting: Internal Medicine

## 2013-08-27 ENCOUNTER — Encounter: Payer: Self-pay | Admitting: Internal Medicine

## 2013-08-27 ENCOUNTER — Ambulatory Visit (INDEPENDENT_AMBULATORY_CARE_PROVIDER_SITE_OTHER): Payer: 59 | Admitting: Internal Medicine

## 2013-08-27 ENCOUNTER — Other Ambulatory Visit (INDEPENDENT_AMBULATORY_CARE_PROVIDER_SITE_OTHER): Payer: 59

## 2013-08-27 VITALS — BP 128/84 | HR 60 | Temp 97.1°F | Resp 12 | Ht 65.0 in | Wt 255.0 lb

## 2013-08-27 DIAGNOSIS — Z6841 Body Mass Index (BMI) 40.0 and over, adult: Secondary | ICD-10-CM | POA: Insufficient documentation

## 2013-08-27 DIAGNOSIS — D518 Other vitamin B12 deficiency anemias: Secondary | ICD-10-CM

## 2013-08-27 DIAGNOSIS — R7309 Other abnormal glucose: Secondary | ICD-10-CM

## 2013-08-27 DIAGNOSIS — Z0000 Encounter for general adult medical examination without abnormal findings: Secondary | ICD-10-CM

## 2013-08-27 DIAGNOSIS — D509 Iron deficiency anemia, unspecified: Secondary | ICD-10-CM

## 2013-08-27 DIAGNOSIS — Z23 Encounter for immunization: Secondary | ICD-10-CM

## 2013-08-27 DIAGNOSIS — D519 Vitamin B12 deficiency anemia, unspecified: Secondary | ICD-10-CM

## 2013-08-27 DIAGNOSIS — R739 Hyperglycemia, unspecified: Secondary | ICD-10-CM | POA: Insufficient documentation

## 2013-08-27 LAB — CBC WITH DIFFERENTIAL/PLATELET
Basophils Absolute: 0.1 10*3/uL (ref 0.0–0.1)
Basophils Relative: 0.6 % (ref 0.0–3.0)
Eosinophils Absolute: 0.2 10*3/uL (ref 0.0–0.7)
Eosinophils Relative: 1.6 % (ref 0.0–5.0)
HCT: 35.2 % — ABNORMAL LOW (ref 36.0–46.0)
Hemoglobin: 11.6 g/dL — ABNORMAL LOW (ref 12.0–15.0)
Lymphocytes Relative: 23.8 % (ref 12.0–46.0)
Lymphs Abs: 2.3 10*3/uL (ref 0.7–4.0)
MCHC: 32.8 g/dL (ref 30.0–36.0)
MCV: 83.1 fl (ref 78.0–100.0)
Monocytes Absolute: 0.4 10*3/uL (ref 0.1–1.0)
Monocytes Relative: 3.7 % (ref 3.0–12.0)
Neutro Abs: 6.8 10*3/uL (ref 1.4–7.7)
Neutrophils Relative %: 70.3 % (ref 43.0–77.0)
Platelets: 384 10*3/uL (ref 150.0–400.0)
RBC: 4.23 Mil/uL (ref 3.87–5.11)
RDW: 15.5 % — ABNORMAL HIGH (ref 11.5–14.6)
WBC: 9.6 10*3/uL (ref 4.5–10.5)

## 2013-08-27 LAB — FERRITIN: Ferritin: 79 ng/mL (ref 10.0–291.0)

## 2013-08-27 LAB — COMPREHENSIVE METABOLIC PANEL
ALT: 17 U/L (ref 0–35)
AST: 17 U/L (ref 0–37)
Albumin: 3.9 g/dL (ref 3.5–5.2)
Alkaline Phosphatase: 61 U/L (ref 39–117)
BUN: 12 mg/dL (ref 6–23)
CO2: 28 mEq/L (ref 19–32)
Calcium: 9 mg/dL (ref 8.4–10.5)
Chloride: 105 mEq/L (ref 96–112)
Creatinine, Ser: 0.6 mg/dL (ref 0.4–1.2)
GFR: 143.38 mL/min (ref >60.00)
Glucose, Bld: 82 mg/dL (ref 70–99)
Potassium: 3.8 mEq/L (ref 3.5–5.1)
Sodium: 138 mEq/L (ref 135–145)
Total Bilirubin: 0.6 mg/dL (ref 0.3–1.2)
Total Protein: 7.7 g/dL (ref 6.0–8.3)

## 2013-08-27 LAB — TSH: TSH: 0.46 u[IU]/mL (ref 0.35–5.50)

## 2013-08-27 LAB — LIPID PANEL
Cholesterol: 157 mg/dL (ref 0–200)
HDL: 37.8 mg/dL — ABNORMAL LOW (ref >39.00)
LDL Cholesterol: 108 mg/dL — ABNORMAL HIGH (ref 0–99)
Total CHOL/HDL Ratio: 4
Triglycerides: 58 mg/dL (ref 0.0–149.0)
VLDL: 11.6 mg/dL (ref 0.0–40.0)

## 2013-08-27 LAB — IBC PANEL
Iron: 28 ug/dL — ABNORMAL LOW (ref 42–145)
Saturation Ratios: 9 % — ABNORMAL LOW (ref 20.0–50.0)
Transferrin: 221.7 mg/dL (ref 212.0–360.0)

## 2013-08-27 LAB — HEMOGLOBIN A1C: Hgb A1c MFr Bld: 5.8 % (ref 4.6–6.5)

## 2013-08-27 LAB — FOLATE: Folate: 9.8 ng/mL (ref >5.9)

## 2013-08-27 MED ORDER — CYANOCOBALAMIN 1000 MCG/ML IJ SOLN
1000.0000 ug | Freq: Once | INTRAMUSCULAR | Status: AC
  Administered 2013-08-27: 1000 ug via INTRAMUSCULAR

## 2013-08-27 NOTE — Progress Notes (Signed)
  Subjective:    Patient ID: Gail Payne, female    DOB: [birth year], 42 y.o.   MRN: [id number]  Anemia Presents for follow-up visit. There has been no abdominal pain, anorexia, bruising/bleeding easily, confusion, fever, leg swelling, light-headedness, malaise/fatigue, pallor, palpitations, paresthesias, pica or weight loss. Signs of blood loss that are present include menorrhagia. Signs of blood loss that are not present include hematemesis, hematochezia, melena and vaginal bleeding. Past treatments include oral iron supplements. Compliance problems include medication side effects.  Compliance with medications is 26-50%. Side effects of medications include GI discomfort.      Review of Systems  Constitutional: Negative.  Negative for fever, chills, weight loss, malaise/fatigue, diaphoresis, appetite change and fatigue.  HENT: Negative.   Eyes: Negative.   Respiratory: Negative.  Negative for cough, chest tightness, shortness of breath and stridor.   Cardiovascular: Negative.  Negative for chest pain, palpitations and leg swelling.  Gastrointestinal: Negative.  Negative for nausea, vomiting, abdominal pain, diarrhea, constipation, melena, hematochezia, anal bleeding, rectal pain, anorexia and hematemesis.  Endocrine: Negative.   Genitourinary: Positive for menorrhagia. Negative for vaginal bleeding.  Musculoskeletal: Negative.   Skin: Negative.  Negative for pallor.  Allergic/Immunologic: Negative.   Neurological: Negative.  Negative for dizziness, tremors, seizures, syncope, facial asymmetry, speech difficulty, weakness, light-headedness, numbness, headaches and paresthesias.  Hematological: Negative.  Negative for adenopathy. Does not bruise/bleed easily.  Psychiatric/Behavioral: Negative.  Negative for confusion.       Objective:   Physical Exam  Vitals reviewed. Constitutional: She is oriented to person, place, and time. She appears well-developed and well-nourished. No distress.   HENT:  Head: Normocephalic and atraumatic.  Mouth/Throat: Oropharynx is clear and moist. No oropharyngeal exudate.  Eyes: Conjunctivae are normal. Right eye exhibits no discharge. Left eye exhibits no discharge. No scleral icterus.  Neck: Normal range of motion. Neck supple. No JVD present. No tracheal deviation present. No thyromegaly present.  Cardiovascular: Normal rate, regular rhythm, normal heart sounds and intact distal pulses.  Exam reveals no gallop and no friction rub.   No murmur heard. Pulmonary/Chest: Effort normal and breath sounds normal. No stridor. No respiratory distress. She has no wheezes. She has no rales. She exhibits no tenderness.  Abdominal: Soft. Bowel sounds are normal. She exhibits no distension and no mass. There is no tenderness. There is no rebound and no guarding.  Musculoskeletal: Normal range of motion. She exhibits no edema and no tenderness.  Lymphadenopathy:    She has no cervical adenopathy.  Neurological: She is oriented to person, place, and time.  Skin: Skin is warm and dry. No rash noted. She is not diaphoretic. No erythema. No pallor.  Psychiatric: She has a normal mood and affect. Judgment and thought content normal.     Lab Results  Component Value Date   WBC 11.5* 08/14/2012   HGB 11.9* 08/14/2012   HCT 36.8 08/14/2012   PLT 360.0 08/14/2012   GLUCOSE 97 01/31/2012   CHOL 157 01/31/2012   TRIG 92.0 01/31/2012   HDL 41.60 01/31/2012   LDLCALC 97 01/31/2012   ALT 17 01/31/2012   AST 18 01/31/2012   NA 136 01/31/2012   K 4.1 01/31/2012   CL 103 01/31/2012   CREATININE 0.7 01/31/2012   BUN 13 01/31/2012   CO2 27 01/31/2012   TSH 0.97 01/04/2011       Assessment & Plan:

## 2013-08-27 NOTE — Patient Instructions (Signed)
Preventive Care for Adults, Female A healthy lifestyle and preventive care can promote health and wellness. Preventive health guidelines for women include the following key practices.  A routine yearly physical is a good way to check with your caregiver about your health and preventive screening. It is a chance to share any concerns and updates on your health, and to receive a thorough exam.  Visit your dentist for a routine exam and preventive care every 6 months. Brush your teeth twice a day and floss once a day. Good oral hygiene prevents tooth decay and gum disease.  The frequency of eye exams is based on your age, health, family medical history, use of contact lenses, and other factors. Follow your caregiver's recommendations for frequency of eye exams.  Eat a healthy diet. Foods like vegetables, fruits, whole grains, low-fat dairy products, and lean protein foods contain the nutrients you need without too many calories. Decrease your intake of foods high in solid fats, added sugars, and salt. Eat the right amount of calories for you.Get information about a proper diet from your caregiver, if necessary.  Regular physical exercise is one of the most important things you can do for your health. Most adults should get at least 150 minutes of moderate-intensity exercise (any activity that increases your heart rate and causes you to sweat) each week. In addition, most adults need muscle-strengthening exercises on 2 or more days a week.  Maintain a healthy weight. The body mass index (BMI) is a screening tool to identify possible weight problems. It provides an estimate of body fat based on height and weight. Your caregiver can help determine your BMI, and can help you achieve or maintain a healthy weight.For adults 20 years and older:  A BMI below 18.5 is considered underweight.  A BMI of 18.5 to 24.9 is normal.  A BMI of 25 to 29.9 is considered overweight.  A BMI of 30 and above is  considered obese.  Maintain normal blood lipids and cholesterol levels by exercising and minimizing your intake of saturated fat. Eat a balanced diet with plenty of fruit and vegetables. Blood tests for lipids and cholesterol should begin at age 20 and be repeated every 5 years. If your lipid or cholesterol levels are high, you are over 50, or you are at high risk for heart disease, you may need your cholesterol levels checked more frequently.Ongoing high lipid and cholesterol levels should be treated with medicines if diet and exercise are not effective.  If you smoke, find out from your caregiver how to quit. If you do not use tobacco, do not start.  If you are pregnant, do not drink alcohol. If you are breastfeeding, be very cautious about drinking alcohol. If you are not pregnant and choose to drink alcohol, do not exceed 1 drink per day. One drink is considered to be 12 ounces (355 mL) of beer, 5 ounces (148 mL) of wine, or 1.5 ounces (44 mL) of liquor.  Avoid use of street drugs. Do not share needles with anyone. Ask for help if you need support or instructions about stopping the use of drugs.  High blood pressure causes heart disease and increases the risk of stroke. Your blood pressure should be checked at least every 1 to 2 years. Ongoing high blood pressure should be treated with medicines if weight loss and exercise are not effective.  If you are 55 to 42 years old, ask your caregiver if you should take aspirin to prevent strokes.  Diabetes   screening involves taking a blood sample to check your fasting blood sugar level. This should be done once every 3 years, after age 45, if you are within normal weight and without risk factors for diabetes. Testing should be considered at a younger age or be carried out more frequently if you are overweight and have at least 1 risk factor for diabetes.  Breast cancer screening is essential preventive care for women. You should practice "breast  self-awareness." This means understanding the normal appearance and feel of your breasts and may include breast self-examination. Any changes detected, no matter how small, should be reported to a caregiver. Women in their 20s and 30s should have a clinical breast exam (CBE) by a caregiver as part of a regular health exam every 1 to 3 years. After age 40, women should have a CBE every year. Starting at age 40, women should consider having a mammography (breast X-ray test) every year. Women who have a family history of breast cancer should talk to their caregiver about genetic screening. Women at a high risk of breast cancer should talk to their caregivers about having magnetic resonance imaging (MRI) and a mammography every year.  The Pap test is a screening test for cervical cancer. A Pap test can show cell changes on the cervix that might become cervical cancer if left untreated. A Pap test is a procedure in which cells are obtained and examined from the lower end of the uterus (cervix).  Women should have a Pap test starting at age 21.  Between ages 21 and 29, Pap tests should be repeated every 2 years.  Beginning at age 30, you should have a Pap test every 3 years as long as the past 3 Pap tests have been normal.  Some women have medical problems that increase the chance of getting cervical cancer. Talk to your caregiver about these problems. It is especially important to talk to your caregiver if a new problem develops soon after your last Pap test. In these cases, your caregiver may recommend more frequent screening and Pap tests.  The above recommendations are the same for women who have or have not gotten the vaccine for human papillomavirus (HPV).  If you had a hysterectomy for a problem that was not cancer or a condition that could lead to cancer, then you no longer need Pap tests. Even if you no longer need a Pap test, a regular exam is a good idea to make sure no other problems are  starting.  If you are between ages 65 and 70, and you have had normal Pap tests going back 10 years, you no longer need Pap tests. Even if you no longer need a Pap test, a regular exam is a good idea to make sure no other problems are starting.  If you have had past treatment for cervical cancer or a condition that could lead to cancer, you need Pap tests and screening for cancer for at least 20 years after your treatment.  If Pap tests have been discontinued, risk factors (such as a new sexual partner) need to be reassessed to determine if screening should be resumed.  The HPV test is an additional test that may be used for cervical cancer screening. The HPV test looks for the virus that can cause the cell changes on the cervix. The cells collected during the Pap test can be tested for HPV. The HPV test could be used to screen women aged 30 years and older, and should   be used in women of any age who have unclear Pap test results. After the age of 30, women should have HPV testing at the same frequency as a Pap test.  Colorectal cancer can be detected and often prevented. Most routine colorectal cancer screening begins at the age of 50 and continues through age 75. However, your caregiver may recommend screening at an earlier age if you have risk factors for colon cancer. On a yearly basis, your caregiver may provide home test kits to check for hidden blood in the stool. Use of a small camera at the end of a tube, to directly examine the colon (sigmoidoscopy or colonoscopy), can detect the earliest forms of colorectal cancer. Talk to your caregiver about this at age 50, when routine screening begins. Direct examination of the colon should be repeated every 5 to 10 years through age 75, unless early forms of pre-cancerous polyps or small growths are found.  Hepatitis C blood testing is recommended for all people born from 1945 through 1965 and any individual with known risks for hepatitis C.  Practice  safe sex. Use condoms and avoid high-risk sexual practices to reduce the spread of sexually transmitted infections (STIs). STIs include gonorrhea, chlamydia, syphilis, trichomonas, herpes, HPV, and human immunodeficiency virus (HIV). Herpes, HIV, and HPV are viral illnesses that have no cure. They can result in disability, cancer, and death. Sexually active women aged 25 and younger should be checked for chlamydia. Older women with new or multiple partners should also be tested for chlamydia. Testing for other STIs is recommended if you are sexually active and at increased risk.  Osteoporosis is a disease in which the bones lose minerals and strength with aging. This can result in serious bone fractures. The risk of osteoporosis can be identified using a bone density scan. Women ages 65 and over and women at risk for fractures or osteoporosis should discuss screening with their caregivers. Ask your caregiver whether you should take a calcium supplement or vitamin D to reduce the rate of osteoporosis.  Menopause can be associated with physical symptoms and risks. Hormone replacement therapy is available to decrease symptoms and risks. You should talk to your caregiver about whether hormone replacement therapy is right for you.  Use sunscreen with sun protection factor (SPF) of 30 or more. Apply sunscreen liberally and repeatedly throughout the day. You should seek shade when your shadow is shorter than you. Protect yourself by wearing long sleeves, pants, a wide-brimmed hat, and sunglasses year round, whenever you are outdoors.  Once a month, do a whole body skin exam, using a mirror to look at the skin on your back. Notify your caregiver of new moles, moles that have irregular borders, moles that are larger than a pencil eraser, or moles that have changed in shape or color.  Stay current with required immunizations.  Influenza. You need a dose every fall (or winter). The composition of the flu vaccine  changes each year, so being vaccinated once is not enough.  Pneumococcal polysaccharide. You need 1 to 2 doses if you smoke cigarettes or if you have certain chronic medical conditions. You need 1 dose at age 65 (or older) if you have never been vaccinated.  Tetanus, diphtheria, pertussis (Tdap, Td). Get 1 dose of Tdap vaccine if you are younger than age 65, are over 65 and have contact with an infant, are a healthcare worker, are pregnant, or simply want to be protected from whooping cough. After that, you need a Td   booster dose every 10 years. Consult your caregiver if you have not had at least 3 tetanus and diphtheria-containing shots sometime in your life or have a deep or dirty wound.  HPV. You need this vaccine if you are a woman age 26 or younger. The vaccine is given in 3 doses over 6 months.  Measles, mumps, rubella (MMR). You need at least 1 dose of MMR if you were born in 1957 or later. You may also need a second dose.  Meningococcal. If you are age 19 to 21 and a first-year college student living in a residence hall, or have one of several medical conditions, you need to get vaccinated against meningococcal disease. You may also need additional booster doses.  Zoster (shingles). If you are age 60 or older, you should get this vaccine.  Varicella (chickenpox). If you have never had chickenpox or you were vaccinated but received only 1 dose, talk to your caregiver to find out if you need this vaccine.  Hepatitis A. You need this vaccine if you have a specific risk factor for hepatitis A virus infection or you simply wish to be protected from this disease. The vaccine is usually given as 2 doses, 6 to 18 months apart.  Hepatitis B. You need this vaccine if you have a specific risk factor for hepatitis B virus infection or you simply wish to be protected from this disease. The vaccine is given in 3 doses, usually over 6 months. Preventive Services / Frequency Ages 19 to 39  Blood  pressure check.** / Every 1 to 2 years.  Lipid and cholesterol check.** / Every 5 years beginning at age 20.  Clinical breast exam.** / Every 3 years for women in their 20s and 30s.  Pap test.** / Every 2 years from ages 21 through 29. Every 3 years starting at age 30 through age 65 or 70 with a history of 3 consecutive normal Pap tests.  HPV screening.** / Every 3 years from ages 30 through ages 65 to 70 with a history of 3 consecutive normal Pap tests.  Hepatitis C blood test.** / For any individual with known risks for hepatitis C.  Skin self-exam. / Monthly.  Influenza immunization.** / Every year.  Pneumococcal polysaccharide immunization.** / 1 to 2 doses if you smoke cigarettes or if you have certain chronic medical conditions.  Tetanus, diphtheria, pertussis (Tdap, Td) immunization. / A one-time dose of Tdap vaccine. After that, you need a Td booster dose every 10 years.  HPV immunization. / 3 doses over 6 months, if you are 26 and younger.  Measles, mumps, rubella (MMR) immunization. / You need at least 1 dose of MMR if you were born in 1957 or later. You may also need a second dose.  Meningococcal immunization. / 1 dose if you are age 19 to 21 and a first-year college student living in a residence hall, or have one of several medical conditions, you need to get vaccinated against meningococcal disease. You may also need additional booster doses.  Varicella immunization.** / Consult your caregiver.  Hepatitis A immunization.** / Consult your caregiver. 2 doses, 6 to 18 months apart.  Hepatitis B immunization.** / Consult your caregiver. 3 doses usually over 6 months. Ages 40 to 64  Blood pressure check.** / Every 1 to 2 years.  Lipid and cholesterol check.** / Every 5 years beginning at age 20.  Clinical breast exam.** / Every year after age 40.  Mammogram.** / Every year beginning at age 40   and continuing for as long as you are in good health. Consult with your  caregiver.  Pap test.** / Every 3 years starting at age 30 through age 65 or 70 with a history of 3 consecutive normal Pap tests.  HPV screening.** / Every 3 years from ages 30 through ages 65 to 70 with a history of 3 consecutive normal Pap tests.  Fecal occult blood test (FOBT) of stool. / Every year beginning at age 50 and continuing until age 75. You may not need to do this test if you get a colonoscopy every 10 years.  Flexible sigmoidoscopy or colonoscopy.** / Every 5 years for a flexible sigmoidoscopy or every 10 years for a colonoscopy beginning at age 50 and continuing until age 75.  Hepatitis C blood test.** / For all people born from 1945 through 1965 and any individual with known risks for hepatitis C.  Skin self-exam. / Monthly.  Influenza immunization.** / Every year.  Pneumococcal polysaccharide immunization.** / 1 to 2 doses if you smoke cigarettes or if you have certain chronic medical conditions.  Tetanus, diphtheria, pertussis (Tdap, Td) immunization.** / A one-time dose of Tdap vaccine. After that, you need a Td booster dose every 10 years.  Measles, mumps, rubella (MMR) immunization. / You need at least 1 dose of MMR if you were born in 1957 or later. You may also need a second dose.  Varicella immunization.** / Consult your caregiver.  Meningococcal immunization.** / Consult your caregiver.  Hepatitis A immunization.** / Consult your caregiver. 2 doses, 6 to 18 months apart.  Hepatitis B immunization.** / Consult your caregiver. 3 doses, usually over 6 months. Ages 65 and over  Blood pressure check.** / Every 1 to 2 years.  Lipid and cholesterol check.** / Every 5 years beginning at age 20.  Clinical breast exam.** / Every year after age 40.  Mammogram.** / Every year beginning at age 40 and continuing for as long as you are in good health. Consult with your caregiver.  Pap test.** / Every 3 years starting at age 30 through age 65 or 70 with a 3  consecutive normal Pap tests. Testing can be stopped between 65 and 70 with 3 consecutive normal Pap tests and no abnormal Pap or HPV tests in the past 10 years.  HPV screening.** / Every 3 years from ages 30 through ages 65 or 70 with a history of 3 consecutive normal Pap tests. Testing can be stopped between 65 and 70 with 3 consecutive normal Pap tests and no abnormal Pap or HPV tests in the past 10 years.  Fecal occult blood test (FOBT) of stool. / Every year beginning at age 50 and continuing until age 75. You may not need to do this test if you get a colonoscopy every 10 years.  Flexible sigmoidoscopy or colonoscopy.** / Every 5 years for a flexible sigmoidoscopy or every 10 years for a colonoscopy beginning at age 50 and continuing until age 75.  Hepatitis C blood test.** / For all people born from 1945 through 1965 and any individual with known risks for hepatitis C.  Osteoporosis screening.** / A one-time screening for women ages 65 and over and women at risk for fractures or osteoporosis.  Skin self-exam. / Monthly.  Influenza immunization.** / Every year.  Pneumococcal polysaccharide immunization.** / 1 dose at age 65 (or older) if you have never been vaccinated.  Tetanus, diphtheria, pertussis (Tdap, Td) immunization. / A one-time dose of Tdap vaccine if you are over   65 and have contact with an infant, are a Research scientist (physical sciences), or simply want to be protected from whooping cough. After that, you need a Td booster dose every 10 years.  Varicella immunization.** / Consult your caregiver.  Meningococcal immunization.** / Consult your caregiver.  Hepatitis A immunization.** / Consult your caregiver. 2 doses, 6 to 18 months apart.  Hepatitis B immunization.** / Check with your caregiver. 3 doses, usually over 6 months. ** Family history and personal history of risk and conditions may change your caregiver's recommendations. Document Released: 01/21/2002 Document Revised: 02/17/2012  Document Reviewed: 04/22/2011 [facility] Patient Information 2014 [place], [place]. Iron Deficiency Anemia There are many types of anemia. Iron deficiency anemia is the most common. Iron deficiency anemia is a decrease in the number of red blood cells caused by too little iron. Without enough iron, your body does not produce enough hemoglobin. Hemoglobin is a substance in red blood cells that carries oxygen to the body's tissues. Iron deficiency anemia may leave you tired and short of breath. CAUSES   Lack of iron in the diet.  This may be seen in infants and children, because there is little iron in milk.  This may be seen in adults who do not eat enough iron-rich foods.  This may be seen in pregnant or breastfeeding women who do not take iron supplements. There is a much higher need for iron intake at these times.  Poor absorption of iron, as seen with intestinal disorders.  Intestinal bleeding.  Heavy periods. SYMPTOMS  Mild anemia may not be noticeable. Symptoms may include:  Fatigue.  Headache.  Pale skin.  Weakness.  Shortness of breath.  Dizziness.  Cold hands and feet.  Fast or irregular heartbeat. DIAGNOSIS  Diagnosis requires a thorough evaluation and physical exam by your caregiver.  Blood tests are generally used to confirm iron deficiency anemia.  Additional tests may be done to find the underlying cause of your anemia. These may include:  Testing for blood in the stool (fecal occult blood test).  A procedure to see inside the colon and rectum (colonoscopy).  A procedure to see inside the esophagus and stomach (endoscopy). TREATMENT   Correcting the cause of the iron deficiency is the first step.  Medicines, such as oral contraceptives, can make heavy menstrual flows lighter.  Antibiotics and other medicines can be used to treat peptic ulcers.  Surgery may be needed to remove a bleeding polyp, tumor, or fibroid.  Often, iron supplements (ferrous  sulfate) are taken.  For the best iron absorption, take these supplements with an empty stomach.  You may need to take the supplements with food if you cannot tolerate them on an empty stomach. Vitamin C improves the absorption of iron. Your caregiver may recommend taking your iron tablets with a glass of orange juice or vitamin C supplement.  Milk and antacids should not be taken at the same time as iron supplements. They may interfere with the absorption of iron.  Iron supplements can cause constipation. A stool softener is often recommended.  Pregnant and breastfeeding women will need to take extra iron, because their normal diet usually will not provide the required amount.  Patients who cannot tolerate iron by mouth can take it through a vein (intravenously) or by an injection into the muscle. HOME CARE INSTRUCTIONS   Ask your dietitian for help with diet questions.  Take iron and vitamins as directed by your caregiver.  Eat a diet rich in iron. Eat liver, lean beef, whole-grain  bread, eggs, dried fruit, and dark green leafy vegetables. SEEK IMMEDIATE MEDICAL CARE IF:   You have a fainting episode. Do not drive yourself. Call your local emergency services (911 in [place] if no other help is available.  You have chest pain, nausea, or vomiting.  You develop severe or increased shortness of breath with activities.  You develop weakness or increased thirst.  You have a rapid heartbeat.  You develop unexplained sweating or become lightheaded when getting up from a chair or bed. MAKE SURE YOU:   Understand these instructions.  Will watch your condition.  Will get help right away if you are not doing well or get worse. Document Released: 11/22/2000 Document Revised: 02/17/2012 Document Reviewed: 04/03/2010 [facility] Patient Information 2014 [place], [place].

## 2013-08-27 NOTE — Assessment & Plan Note (Signed)
She was congratulated on the 5 lb weight loss

## 2013-08-29 ENCOUNTER — Encounter: Payer: Self-pay | Admitting: Internal Medicine

## 2013-08-29 NOTE — Assessment & Plan Note (Signed)
Exam done Vaccines were updated Labs ordered Pt ed material was given 

## 2013-08-29 NOTE — Assessment & Plan Note (Signed)
She is having some GI discomfort from the iron therapy but her iron level is still slightly low She will let me know if she is not able to tolerate iron and if so I will refer her to hematology to consider an iron infusion

## 2013-08-29 NOTE — Assessment & Plan Note (Signed)
I will check her A1C to see if she has developed DM2 

## 2013-08-30 ENCOUNTER — Ambulatory Visit: Payer: 59

## 2013-10-01 ENCOUNTER — Ambulatory Visit: Payer: 59

## 2013-11-12 ENCOUNTER — Ambulatory Visit (INDEPENDENT_AMBULATORY_CARE_PROVIDER_SITE_OTHER): Payer: 59 | Admitting: *Deleted

## 2013-11-12 DIAGNOSIS — D518 Other vitamin B12 deficiency anemias: Secondary | ICD-10-CM

## 2013-11-12 DIAGNOSIS — D519 Vitamin B12 deficiency anemia, unspecified: Secondary | ICD-10-CM

## 2013-11-12 MED ORDER — CYANOCOBALAMIN 1000 MCG/ML IJ SOLN
1000.0000 ug | Freq: Once | INTRAMUSCULAR | Status: AC
  Administered 2013-11-12: 1000 ug via INTRAMUSCULAR

## 2013-12-31 ENCOUNTER — Ambulatory Visit (INDEPENDENT_AMBULATORY_CARE_PROVIDER_SITE_OTHER): Payer: 59 | Admitting: Internal Medicine

## 2013-12-31 ENCOUNTER — Encounter: Payer: Self-pay | Admitting: Internal Medicine

## 2013-12-31 ENCOUNTER — Other Ambulatory Visit (INDEPENDENT_AMBULATORY_CARE_PROVIDER_SITE_OTHER): Payer: 59

## 2013-12-31 VITALS — BP 130/80 | HR 64 | Temp 98.2°F | Resp 16 | Ht 65.5 in | Wt 252.0 lb

## 2013-12-31 DIAGNOSIS — D509 Iron deficiency anemia, unspecified: Secondary | ICD-10-CM

## 2013-12-31 DIAGNOSIS — D519 Vitamin B12 deficiency anemia, unspecified: Secondary | ICD-10-CM

## 2013-12-31 DIAGNOSIS — D518 Other vitamin B12 deficiency anemias: Secondary | ICD-10-CM

## 2013-12-31 LAB — CBC WITH DIFFERENTIAL/PLATELET
Basophils Absolute: 0 10*3/uL (ref 0.0–0.1)
Basophils Relative: 0.3 % (ref 0.0–3.0)
Eosinophils Absolute: 0.1 10*3/uL (ref 0.0–0.7)
Eosinophils Relative: 1.5 % (ref 0.0–5.0)
HCT: 35.5 % — ABNORMAL LOW (ref 36.0–46.0)
Hemoglobin: 11.6 g/dL — ABNORMAL LOW (ref 12.0–15.0)
Lymphocytes Relative: 27.6 % (ref 12.0–46.0)
Lymphs Abs: 2.5 10*3/uL (ref 0.7–4.0)
MCHC: 32.7 g/dL (ref 30.0–36.0)
MCV: 83.2 fl (ref 78.0–100.0)
Monocytes Absolute: 0.2 10*3/uL (ref 0.1–1.0)
Monocytes Relative: 2.3 % — ABNORMAL LOW (ref 3.0–12.0)
Neutro Abs: 6.3 10*3/uL (ref 1.4–7.7)
Neutrophils Relative %: 68.3 % (ref 43.0–77.0)
Platelets: 405 10*3/uL — ABNORMAL HIGH (ref 150.0–400.0)
RBC: 4.26 Mil/uL (ref 3.87–5.11)
RDW: 15.1 % — ABNORMAL HIGH (ref 11.5–14.6)
WBC: 9.2 10*3/uL (ref 4.5–10.5)

## 2013-12-31 LAB — FERRITIN: Ferritin: 93 ng/mL (ref 10.0–291.0)

## 2013-12-31 LAB — IBC PANEL
Iron: 35 ug/dL — ABNORMAL LOW (ref 42–145)
Saturation Ratios: 11.1 % — ABNORMAL LOW (ref 20.0–50.0)
Transferrin: 225.6 mg/dL (ref 212.0–360.0)

## 2013-12-31 MED ORDER — FERROUS SULFATE 325 (65 FE) MG PO TABS: 325.0000 mg | ORAL_TABLET | Freq: Two times a day (BID) | ORAL | Status: DC

## 2013-12-31 MED ORDER — CYANOCOBALAMIN 1000 MCG/ML IJ SOLN
1000.0000 ug | Freq: Once | INTRAMUSCULAR | Status: AC
Start: 2013-12-31 — End: 2013-12-31
  Administered 2013-12-31: 1000 ug via INTRAMUSCULAR

## 2013-12-31 NOTE — Patient Instructions (Signed)
Iron Deficiency Anemia, Adult  Anemia is a condition in which there are less red blood cells or hemoglobin in the blood than normal. Hemoglobin is this part of red blood cells that carries oxygen. Iron deficiency anemia is anemia caused by too little iron. It is the most common type of anemia. It may leave you tired and short of breath.  CAUSES    Lack of iron in the diet.   Poor absorption of iron, as seen with intestinal disorders.   Intestinal bleeding.   Heavy periods.  SIGNS AND SYMPTOMS   Mild anemia may not be noticeable. Symptoms may include:   Fatigue.   Headache.   Pale skin.   Weakness.   Tiredness.   Shortness of breath.   Dizziness.   Cold hands and feet.   Fast or irregular heartbeat.  DIAGNOSIS   Diagnosis requires a thorough evaluation and physical exam by your health care provider. Blood tests are generally used to confirm iron deficiency anemia. Additional tests may be done to find the underlying cause of your anemia. These may include:   Testing for blood in the stool (fecal occult blood test).   A procedure to see inside the colon and rectum (colonoscopy).   A procedure to see inside the esophagus and stomach (endoscopy).  TREATMENT   Iron deficiency anemia is treated by correcting the cause of the deficiency. Treatment may involve:   Adding iron-rich foods to your diet.   Taking iron supplements. Pregnant or breastfeeding women need to take extra iron, because their normal diet usually does not provide the required amount.   Taking vitamins. Vitamin C improves the absorption of iron. Your health care provider may recommend taking your iron tablets with a glass of orange juice or vitamin C supplement.   Medicines to make heavy menstrual flow lighter.   Surgery.  HOME CARE INSTRUCTIONS    Take iron as directed by your health care provider.   If you cannot tolerate taking iron supplements by mouth, talk to your health care provider about taking them through a vein  (intravenously) or an injection into a muscle.   For the best iron absorption, iron supplements should be taken on an empty stomach. If you cannot tolerate them on an empty stomach, you may need to take them with food.   Do not drink milk or take antacids at the same time as your iron supplements. Milk and antacids may interfere with the absorption of iron.   Iron supplements can cause constipation. Make sure to include fiber in your diet to prevent constipation. A stool softener may also be recommended.   Take vitamins as directed by your health care provider.   Eat a diet rich in iron. Foods high in iron include liver, lean beef, whole-grain bread, eggs, dried fruit, and dark green, leafy vegetables.  SEEK IMMEDIATE MEDICAL CARE IF:    You faint. If this happens, do not drive. Call your local emergency services (911 in [place] if no other help is available.   You have chest pain.   You feel nauseous or vomit.   You have severe or increased shortness of breath with activity.   You feel weak.   You have a rapid heartbeat.   You have unexplained sweating.   You become lightheaded when getting up from a chair or bed.  MAKE SURE YOU:    Understand these instructions.   Will watch your condition.   Will get help right away if you are not 

## 2013-12-31 NOTE — Progress Notes (Signed)
   Subjective:    Patient ID: Gail Payne, female    DOB: Mar 25, 1971, 43 y.o.   MRN: [id number]  Anemia Presents for follow-up visit. There has been no abdominal pain, anorexia, bruising/bleeding easily, confusion, fever, leg swelling, light-headedness, malaise/fatigue, pallor, palpitations, paresthesias, pica or weight loss. Signs of blood loss that are present include menorrhagia. Signs of blood loss that are not present include hematemesis, hematochezia, melena and vaginal bleeding. Past treatments include oral iron supplements and oral vitamin B12. Compliance problems include medication side effects.  Side effects of medications include GI discomfort.      Review of Systems  Constitutional: Negative.  Negative for fever, chills, weight loss, malaise/fatigue, diaphoresis, appetite change and fatigue.  HENT: Negative.   Eyes: Negative.   Respiratory: Negative.  Negative for cough, choking, chest tightness, shortness of breath, wheezing and stridor.   Cardiovascular: Negative.  Negative for chest pain, palpitations and leg swelling.  Gastrointestinal: Negative.  Negative for nausea, vomiting, abdominal pain, diarrhea, constipation, melena, hematochezia, anorexia and hematemesis.  Endocrine: Negative.   Genitourinary: Positive for menorrhagia. Negative for vaginal bleeding.  Musculoskeletal: Negative.   Skin: Negative.  Negative for pallor.  Allergic/Immunologic: Negative.   Neurological: Negative.  Negative for light-headedness and paresthesias.  Hematological: Negative.  Does not bruise/bleed easily.  Psychiatric/Behavioral: Negative.  Negative for confusion.       Objective:   Physical Exam  Vitals reviewed. Constitutional: She is oriented to person, place, and time. She appears well-developed and well-nourished. No distress.  HENT:  Head: Normocephalic and atraumatic.  Mouth/Throat: Oropharynx is clear and moist. No oropharyngeal exudate.  Eyes: Conjunctivae are normal.  Right eye exhibits no discharge. Left eye exhibits no discharge. No scleral icterus.  Neck: Normal range of motion. Neck supple. No JVD present. No tracheal deviation present. No thyromegaly present.  Cardiovascular: Normal rate, regular rhythm, normal heart sounds and intact distal pulses.  Exam reveals no gallop and no friction rub.   No murmur heard. Pulmonary/Chest: Effort normal and breath sounds normal. No stridor. No respiratory distress. She has no wheezes. She has no rales. She exhibits no tenderness.  Abdominal: Soft. Bowel sounds are normal. She exhibits no distension and no mass. There is no tenderness. There is no rebound and no guarding.  Musculoskeletal: Normal range of motion. She exhibits no edema and no tenderness.  Lymphadenopathy:    She has no cervical adenopathy.  Neurological: She is oriented to person, place, and time.  Skin: Skin is warm and dry. No rash noted. She is not diaphoretic. No erythema. No pallor.  Psychiatric: She has a normal mood and affect. Her behavior is normal. Judgment and thought content normal.     Lab Results  Component Value Date   WBC 9.6 08/27/2013   HGB 11.6* 08/27/2013   HCT 35.2* 08/27/2013   PLT 384.0 08/27/2013   GLUCOSE 82 08/27/2013   CHOL 157 08/27/2013   TRIG 58.0 08/27/2013   HDL 37.80* 08/27/2013   LDLCALC 108* 08/27/2013   ALT 17 08/27/2013   AST 17 08/27/2013   NA 138 08/27/2013   K 3.8 08/27/2013   CL 105 08/27/2013   CREATININE 0.6 08/27/2013   BUN 12 08/27/2013   CO2 28 08/27/2013   TSH 0.46 08/27/2013   HGBA1C 5.8 08/27/2013       Assessment & Plan:

## 2013-12-31 NOTE — Progress Notes (Signed)
Pre visit review using our clinic review tool, if applicable. No additional management support is needed unless otherwise documented below in the visit note. 

## 2013-12-31 NOTE — Assessment & Plan Note (Signed)
Anemia is unchanged Her iron level is better but still low - she will increase her iron dose to TID

## 2014-01-17 ENCOUNTER — Ambulatory Visit (INDEPENDENT_AMBULATORY_CARE_PROVIDER_SITE_OTHER): Payer: 59 | Admitting: Internal Medicine

## 2014-01-17 ENCOUNTER — Encounter: Payer: Self-pay | Admitting: Internal Medicine

## 2014-01-17 ENCOUNTER — Ambulatory Visit (INDEPENDENT_AMBULATORY_CARE_PROVIDER_SITE_OTHER)
Admission: RE | Admit: 2014-01-17 | Discharge: 2014-01-17 | Disposition: A | Discharge status: Home / Self Care | Payer: 59 | Source: Ambulatory Visit | Attending: Internal Medicine | Admitting: Internal Medicine

## 2014-01-17 VITALS — BP 130/88 | HR 69 | Temp 97.8°F | Resp 16 | Ht 65.0 in | Wt 255.2 lb

## 2014-01-17 DIAGNOSIS — M546 Pain in thoracic spine: Secondary | ICD-10-CM

## 2014-01-17 NOTE — Assessment & Plan Note (Signed)
Exam is normal She is getting relief with nsaids so will continue that for now I will check plain xrays to look for structural concerns

## 2014-01-17 NOTE — Patient Instructions (Signed)
Back Pain, Adult Low back pain is very common. About 1 in 5 people have back pain.The cause of low back pain is rarely dangerous. The pain often gets better over time.About half of people with a sudden onset of back pain feel better in just 2 weeks. About 8 in 10 people feel better by 6 weeks.  CAUSES Some common causes of back pain include:  Strain of the muscles or ligaments supporting the spine.  Wear and tear (degeneration) of the spinal discs.  Arthritis.  Direct injury to the back. DIAGNOSIS Most of the time, the direct cause of low back pain is not known.However, back pain can be treated effectively even when the exact cause of the pain is unknown.Answering your caregiver's questions about your overall health and symptoms is one of the most accurate ways to make sure the cause of your pain is not dangerous. If your caregiver needs more information, he or she may order lab work or imaging tests (X-rays or MRIs).However, even if imaging tests show changes in your back, this usually does not require surgery. HOME CARE INSTRUCTIONS For many people, back pain returns.Since low back pain is rarely dangerous, it is often a condition that people can learn to manageon their own.   Remain active. It is stressful on the back to sit or stand in one place. Do not sit, drive, or stand in one place for more than 30 minutes at a time. Take short walks on level surfaces as soon as pain allows.Try to increase the length of time you walk each day.  Do not stay in bed.Resting more than 1 or 2 days can delay your recovery.  Do not avoid exercise or work.Your body is made to move.It is not dangerous to be active, even though your back may hurt.Your back will likely heal faster if you return to being active before your pain is gone.  Pay attention to your body when you bend and lift. Many people have less discomfortwhen lifting if they bend their knees, keep the load close to their bodies,and  avoid twisting. Often, the most comfortable positions are those that put less stress on your recovering back.  Find a comfortable position to sleep. Use a firm mattress and lie on your side with your knees slightly bent. If you lie on your back, put a pillow under your knees.  Only take over-the-counter or prescription medicines as directed by your caregiver. Over-the-counter medicines to reduce pain and inflammation are often the most helpful.Your caregiver may prescribe muscle relaxant drugs.These medicines help dull your pain so you can more quickly return to your normal activities and healthy exercise.  Put ice on the injured area.  Put ice in a plastic bag.  Place a towel between your skin and the bag.  Leave the ice on for 15-20 minutes, 03-04 times a day for the first 2 to 3 days. After that, ice and heat may be alternated to reduce pain and spasms.  Ask your caregiver about trying back exercises and gentle massage. This may be of some benefit.  Avoid feeling anxious or stressed.Stress increases muscle tension and can worsen back pain.It is important to recognize when you are anxious or stressed and learn ways to manage it.Exercise is a great option. SEEK MEDICAL CARE IF:  You have pain that is not relieved with rest or medicine.  You have pain that does not improve in 1 week.  You have new symptoms.  You are generally not feeling well. SEEK   IMMEDIATE MEDICAL CARE IF:   You have pain that radiates from your back into your legs.  You develop new bowel or bladder control problems.  You have unusual weakness or numbness in your arms or legs.  You develop nausea or vomiting.  You develop abdominal pain.  You feel faint. Document Released: 11/25/2005 Document Revised: 05/26/2012 Document Reviewed: 04/15/2011 [employer] Patient Information 2014 [employer].  

## 2014-01-17 NOTE — Progress Notes (Signed)
Pre visit review using our clinic review tool, if applicable. No additional management support is needed unless otherwise documented below in the visit note. 

## 2014-01-17 NOTE — Progress Notes (Signed)
Subjective:    Patient ID: Gail Payne, female    DOB: [birth year], 43 y.o.   MRN: [id number]  Back Pain This is a new problem. The current episode started 1 to 4 weeks ago. The problem occurs intermittently. The problem is unchanged. The pain is present in the thoracic spine. The quality of the pain is described as aching. The pain does not radiate. The pain is at a severity of 2/10. The pain is mild. The pain is worse during the day. The symptoms are aggravated by bending and position. Pertinent negatives include no abdominal pain, bladder incontinence, bowel incontinence, chest pain, dysuria, fever, headaches, leg pain, numbness, paresis, paresthesias, pelvic pain, perianal numbness, tingling, weakness or weight loss. Risk factors include obesity and lack of exercise. She has tried NSAIDs (motrin) for the symptoms. The treatment provided moderate relief.      Review of Systems  Constitutional: Negative.  Negative for fever, chills, weight loss, diaphoresis, appetite change and fatigue.  HENT: Negative.   Eyes: Negative.   Respiratory: Negative.  Negative for cough, choking, chest tightness, shortness of breath, wheezing and stridor.   Cardiovascular: Negative.  Negative for chest pain, palpitations and leg swelling.  Gastrointestinal: Negative.  Negative for nausea, vomiting, abdominal pain, diarrhea, constipation, blood in stool and bowel incontinence.  Endocrine: Negative.   Genitourinary: Negative.  Negative for bladder incontinence, dysuria, urgency, frequency, hematuria, flank pain, decreased urine volume, vaginal bleeding, vaginal discharge, enuresis, difficulty urinating, genital sores, vaginal pain, menstrual problem, pelvic pain and dyspareunia.  Musculoskeletal: Positive for back pain. Negative for arthralgias, gait problem, joint swelling, myalgias, neck pain and neck stiffness.  Skin: Negative.   Allergic/Immunologic: Negative.   Neurological: Negative.  Negative for  dizziness, tingling, weakness, numbness, headaches and paresthesias.  Hematological: Negative.  Negative for adenopathy. Does not bruise/bleed easily.  Psychiatric/Behavioral: Negative.        Objective:   Physical Exam  Vitals reviewed. Constitutional: She is oriented to person, place, and time. She appears well-developed and well-nourished.  Non-toxic appearance. She does not have a sickly appearance. She does not appear ill. No distress.  HENT:  Head: Normocephalic and atraumatic.  Mouth/Throat: Oropharynx is clear and moist. No oropharyngeal exudate.  Eyes: Conjunctivae are normal. Right eye exhibits no discharge. Left eye exhibits no discharge.  Neck: Normal range of motion. Neck supple. No JVD present. No tracheal deviation present. No thyromegaly present.  Cardiovascular: Normal rate, regular rhythm, normal heart sounds and intact distal pulses.  Exam reveals no gallop and no friction rub.   No murmur heard. Pulmonary/Chest: Effort normal and breath sounds normal. No stridor. No respiratory distress. She has no wheezes. She has no rales. She exhibits no tenderness.  Abdominal: Soft. Normal appearance and bowel sounds are normal. She exhibits no distension and no mass. There is no hepatosplenomegaly, splenomegaly or hepatomegaly. There is no tenderness. There is no rigidity, no rebound, no guarding, no CVA tenderness, no tenderness at McBurney's point and negative Murphy's sign. No hernia. Hernia confirmed negative in the ventral area, confirmed negative in the right inguinal area and confirmed negative in the left inguinal area.  Musculoskeletal: Normal range of motion. She exhibits no edema and no tenderness.       Thoracic back: Normal. She exhibits normal range of motion, no tenderness, no bony tenderness, no swelling, no edema, no deformity, no laceration, no pain, no spasm and normal pulse.  Lymphadenopathy:    She has no cervical adenopathy.  Neurological: She is alert and  oriented to person, place, and time. She has normal strength. She displays no atrophy, no tremor and normal reflexes. No cranial nerve deficit or sensory deficit. She exhibits normal muscle tone. She displays a negative Romberg sign. She displays no seizure activity. Coordination and gait normal. She displays no Babinski's sign on the right side. She displays no Babinski's sign on the left side.  Reflex Scores:      Tricep reflexes are 0 on the right side and 0 on the left side.      Bicep reflexes are 0 on the right side and 0 on the left side.      Brachioradialis reflexes are 0 on the right side and 0 on the left side.      Patellar reflexes are 1+ on the right side and 1+ on the left side.      Achilles reflexes are 1+ on the right side and 1+ on the left side. Skin: Skin is warm and dry. No rash noted. She is not diaphoretic. No erythema. No pallor.  Psychiatric: She has a normal mood and affect. Her behavior is normal. Judgment and thought content normal.     Lab Results  Component Value Date   WBC 9.2 12/31/2013   HGB 11.6* 12/31/2013   HCT 35.5* 12/31/2013   PLT 405.0* 12/31/2013   GLUCOSE 82 08/27/2013   CHOL 157 08/27/2013   TRIG 58.0 08/27/2013   HDL 37.80* 08/27/2013   LDLCALC 108* 08/27/2013   ALT 17 08/27/2013   AST 17 08/27/2013   NA 138 08/27/2013   K 3.8 08/27/2013   CL 105 08/27/2013   CREATININE 0.6 08/27/2013   BUN 12 08/27/2013   CO2 28 08/27/2013   TSH 0.46 08/27/2013   HGBA1C 5.8 08/27/2013        Assessment & Plan:

## 2014-01-19 ENCOUNTER — Telehealth: Payer: Self-pay | Admitting: *Deleted

## 2014-01-19 DIAGNOSIS — M546 Pain in thoracic spine: Secondary | ICD-10-CM

## 2014-01-19 MED ORDER — TRAMADOL HCL 50 MG PO TABS: 50.0000 mg | ORAL_TABLET | Freq: Three times a day (TID) | ORAL | Status: DC | PRN

## 2014-01-19 NOTE — Telephone Encounter (Signed)
Patient phoned requesting a stronger pain medicine to replace the initially recommended OTC-ibuprofen for her back pain.  States the ibuprofen is ineffective.  Last OV with PCP 01/17/14.  CB# [phone]

## 2014-01-19 NOTE — Telephone Encounter (Signed)
Try tramadol 

## 2014-01-19 NOTE — Telephone Encounter (Signed)
Phoned and left voicemail message notifying patient of MD response and order.

## 2014-01-20 ENCOUNTER — Encounter: Payer: Self-pay | Admitting: Internal Medicine

## 2014-01-20 ENCOUNTER — Other Ambulatory Visit: Payer: Self-pay | Admitting: Internal Medicine

## 2014-01-20 DIAGNOSIS — M546 Pain in thoracic spine: Secondary | ICD-10-CM

## 2014-01-20 MED ORDER — TRAMADOL HCL 50 MG PO TABS: 50.0000 mg | ORAL_TABLET | Freq: Three times a day (TID) | ORAL | Status: DC | PRN

## 2014-03-04 ENCOUNTER — Encounter: Payer: Self-pay | Admitting: Sports Medicine

## 2014-03-04 ENCOUNTER — Ambulatory Visit (INDEPENDENT_AMBULATORY_CARE_PROVIDER_SITE_OTHER): Payer: 59 | Admitting: Sports Medicine

## 2014-03-04 VITALS — BP 130/82 | HR 67 | Ht 65.0 in | Wt 255.0 lb

## 2014-03-04 DIAGNOSIS — R269 Unspecified abnormalities of gait and mobility: Secondary | ICD-10-CM

## 2014-03-04 NOTE — Assessment & Plan Note (Signed)
Slight modification of her orthotics was performed in the office.  This is made her orthotics much more comfortable in her shoes.

## 2014-03-04 NOTE — Progress Notes (Signed)
Patient ID: Gail Payne, female   DOB: 03-29-1971, 43 y.o.   MRN: [id number] 43 year old female presents for evaluation of her custom orthotics. She's newer shoes that'll fit quite right causing her some foot pain while at work. History of plantar fasciitis as well. This is treated with orthotics as well as a recent injection. She's tolerating the symptoms without significant limp patient at this time. Her biggest concern today is that her orthotics do not fit in her work shoes.  Review of systems as per history of present illness otherwise all systems negative  Examination: BP 130/82  Pulse 67  Ht 5\' 5"  (1.651 m)  Wt 255 lb (115.667 kg)  BMI 42.43 kg/m2 Well-developed well-nourished 43 year old [place] female awake alert and oriented no acute distress  Bilateral feet: No tenderness to palpation the medial calcaneal tuberosity. Neurovascularly intact with equal pulses.  Her orthotics were evaluated today in the office. The toes do not fit within the toe end of her work shoes appropriately. The 20 of each orthotic was mildly shaved to help them fit better and have more appropriate shape for her new work shoes. If it without difficulty after that. She walked in them without limitation and states that they feel much better.

## 2014-03-10 ENCOUNTER — Ambulatory Visit (INDEPENDENT_AMBULATORY_CARE_PROVIDER_SITE_OTHER): Payer: 59 | Admitting: *Deleted

## 2014-03-10 DIAGNOSIS — D519 Vitamin B12 deficiency anemia, unspecified: Secondary | ICD-10-CM

## 2014-03-10 DIAGNOSIS — D518 Other vitamin B12 deficiency anemias: Secondary | ICD-10-CM

## 2014-03-10 MED ORDER — CYANOCOBALAMIN 1000 MCG/ML IJ SOLN
1000.0000 ug | Freq: Once | INTRAMUSCULAR | Status: AC
  Administered 2014-03-10: 1000 ug via INTRAMUSCULAR

## 2014-04-15 ENCOUNTER — Ambulatory Visit (INDEPENDENT_AMBULATORY_CARE_PROVIDER_SITE_OTHER): Payer: 59

## 2014-04-15 DIAGNOSIS — D518 Other vitamin B12 deficiency anemias: Secondary | ICD-10-CM

## 2014-04-15 DIAGNOSIS — D519 Vitamin B12 deficiency anemia, unspecified: Secondary | ICD-10-CM

## 2014-04-15 MED ORDER — CYANOCOBALAMIN 1000 MCG/ML IJ SOLN
1000.0000 ug | Freq: Once | INTRAMUSCULAR | Status: AC
  Administered 2014-04-15: 1000 ug via INTRAMUSCULAR

## 2014-06-03 ENCOUNTER — Ambulatory Visit (INDEPENDENT_AMBULATORY_CARE_PROVIDER_SITE_OTHER): Payer: 59 | Admitting: Internal Medicine

## 2014-06-03 ENCOUNTER — Encounter: Payer: Self-pay | Admitting: Internal Medicine

## 2014-06-03 ENCOUNTER — Ambulatory Visit (INDEPENDENT_AMBULATORY_CARE_PROVIDER_SITE_OTHER)
Admission: RE | Admit: 2014-06-03 | Discharge: 2014-06-03 | Disposition: A | Discharge status: Home / Self Care | Payer: 59 | Source: Ambulatory Visit | Attending: Internal Medicine | Admitting: Internal Medicine

## 2014-06-03 VITALS — BP 122/80 | HR 74 | Temp 98.0°F | Resp 16 | Ht 65.0 in | Wt 253.4 lb

## 2014-06-03 DIAGNOSIS — D519 Vitamin B12 deficiency anemia, unspecified: Secondary | ICD-10-CM

## 2014-06-03 DIAGNOSIS — J4530 Mild persistent asthma, uncomplicated: Secondary | ICD-10-CM

## 2014-06-03 DIAGNOSIS — D518 Other vitamin B12 deficiency anemias: Secondary | ICD-10-CM

## 2014-06-03 DIAGNOSIS — J45909 Unspecified asthma, uncomplicated: Secondary | ICD-10-CM

## 2014-06-03 DIAGNOSIS — M25519 Pain in unspecified shoulder: Secondary | ICD-10-CM

## 2014-06-03 DIAGNOSIS — M25511 Pain in right shoulder: Secondary | ICD-10-CM

## 2014-06-03 DIAGNOSIS — E538 Deficiency of other specified B group vitamins: Secondary | ICD-10-CM

## 2014-06-03 MED ORDER — BECLOMETHASONE DIPROPIONATE 40 MCG/ACT IN AERS: 1.0000 | INHALATION_SPRAY | Freq: Two times a day (BID) | RESPIRATORY_TRACT | Status: DC | PRN

## 2014-06-03 MED ORDER — HYDROCODONE-ACETAMINOPHEN 5-325 MG PO TABS: 1.0000 | ORAL_TABLET | Freq: Four times a day (QID) | ORAL | Status: DC | PRN

## 2014-06-03 MED ORDER — ALBUTEROL SULFATE HFA 108 (90 BASE) MCG/ACT IN AERS: 2.0000 | INHALATION_SPRAY | RESPIRATORY_TRACT | Status: DC | PRN

## 2014-06-03 MED ORDER — CYANOCOBALAMIN 1000 MCG/ML IJ SOLN
1000.0000 ug | Freq: Once | INTRAMUSCULAR | Status: AC
  Administered 2014-06-03: 1000 ug via INTRAMUSCULAR

## 2014-06-03 NOTE — Assessment & Plan Note (Signed)
She is doing well on her current inhalers Will cont

## 2014-06-03 NOTE — Progress Notes (Signed)
Subjective:    Patient ID: Gail Payne, female    DOB: October 09, 1971, 43 y.o.   MRN: [id number]  Shoulder Pain  The pain is present in the right shoulder. This is a recurrent problem. The current episode started 1 to 4 weeks ago. There has been no history of extremity trauma. The problem occurs intermittently. The problem has been unchanged. The pain is at a severity of 2/10. The pain is mild. Pertinent negatives include no fever, inability to bear weight, itching, joint locking, joint swelling, numbness, stiffness or tingling. She has tried NSAIDS for the symptoms. The treatment provided mild relief. Her past medical history is significant for osteoarthritis.      Review of Systems  Constitutional: Negative.  Negative for fever, chills, diaphoresis, appetite change and fatigue.  HENT: Negative.   Eyes: Negative.   Respiratory: Positive for wheezing (rare). Negative for apnea, cough, choking, chest tightness, shortness of breath and stridor.   Cardiovascular: Negative.  Negative for chest pain, palpitations and leg swelling.  Gastrointestinal: Negative.  Negative for abdominal pain.  Endocrine: Negative.   Genitourinary: Negative.   Musculoskeletal: Positive for arthralgias. Negative for back pain, myalgias, neck pain, neck stiffness and stiffness.  Skin: Negative.  Negative for itching.  Allergic/Immunologic: Negative.   Neurological: Negative.  Negative for tingling and numbness.  Hematological: Negative.  Negative for adenopathy. Does not bruise/bleed easily.  Psychiatric/Behavioral: Negative.        Objective:   Physical Exam  Vitals reviewed. Constitutional: She is oriented to person, place, and time. She appears well-developed and well-nourished. No distress.  HENT:  Head: Normocephalic and atraumatic.  Mouth/Throat: Oropharynx is clear and moist. No oropharyngeal exudate.  Eyes: Conjunctivae are normal. Right eye exhibits no discharge. Left eye exhibits no discharge. No  scleral icterus.  Neck: Normal range of motion. Neck supple. No JVD present. No tracheal deviation present. No thyromegaly present.  Cardiovascular: Normal rate, regular rhythm, normal heart sounds and intact distal pulses.  Exam reveals no gallop and no friction rub.   No murmur heard. Pulmonary/Chest: Effort normal and breath sounds normal. No stridor. No respiratory distress. She has no wheezes. She has no rales. She exhibits no tenderness.  Abdominal: Soft. Bowel sounds are normal. She exhibits no distension. There is no tenderness. There is no rebound and no guarding.  Musculoskeletal: She exhibits no edema and no tenderness.       Right shoulder: She exhibits decreased range of motion. She exhibits no tenderness, no bony tenderness, no swelling, no effusion, no crepitus, no deformity, no laceration, no pain, no spasm, normal pulse and normal strength.  Lymphadenopathy:    She has no cervical adenopathy.  Neurological: She is oriented to person, place, and time.  Skin: Skin is warm and dry. No rash noted. She is not diaphoretic. No erythema. No pallor.     Lab Results  Component Value Date   WBC 9.2 12/31/2013   HGB 11.6* 12/31/2013   HCT 35.5* 12/31/2013   PLT 405.0* 12/31/2013   GLUCOSE 82 08/27/2013   CHOL 157 08/27/2013   TRIG 58.0 08/27/2013   HDL 37.80* 08/27/2013   LDLCALC 108* 08/27/2013   ALT 17 08/27/2013   AST 17 08/27/2013   NA 138 08/27/2013   K 3.8 08/27/2013   CL 105 08/27/2013   CREATININE 0.6 08/27/2013   BUN 12 08/27/2013   CO2 28 08/27/2013   TSH 0.46 08/27/2013   HGBA1C 5.8 08/27/2013       Assessment & Plan:

## 2014-06-03 NOTE — Patient Instructions (Signed)

## 2014-06-03 NOTE — Progress Notes (Signed)
Pre visit review using our clinic review tool, if applicable. No additional management support is needed unless otherwise documented below in the visit note. 

## 2014-06-03 NOTE — Assessment & Plan Note (Signed)
Her plain xray shows DJD and she has pain at night so will try norco for pain relief I have asked her to see ortho as well for further evaluation

## 2014-06-15 ENCOUNTER — Encounter: Payer: Self-pay | Admitting: Internal Medicine

## 2014-06-15 DIAGNOSIS — J4530 Mild persistent asthma, uncomplicated: Secondary | ICD-10-CM

## 2014-06-15 MED ORDER — ALBUTEROL SULFATE HFA 108 (90 BASE) MCG/ACT IN AERS: 2.0000 | INHALATION_SPRAY | RESPIRATORY_TRACT | Status: DC | PRN

## 2014-06-16 ENCOUNTER — Telehealth: Payer: Self-pay | Admitting: *Deleted

## 2014-06-16 MED ORDER — ALBUTEROL SULFATE 108 (90 BASE) MCG/ACT IN AEPB: 2.0000 | INHALATION_SPRAY | RESPIRATORY_TRACT | Status: DC | PRN

## 2014-06-16 NOTE — Telephone Encounter (Signed)
error 

## 2014-06-16 NOTE — Telephone Encounter (Signed)
Left msg on triage stating pt was expecting the Proair Respiclick not the regular proair inhaler. MD gave her a sample and wanting rx call to her pharmacy. Resent Proair respiclick to [facility]...Johny Chess

## 2014-06-27 ENCOUNTER — Other Ambulatory Visit: Payer: Self-pay | Admitting: Internal Medicine

## 2014-07-01 ENCOUNTER — Ambulatory Visit: Payer: 59 | Admitting: Internal Medicine

## 2014-07-08 ENCOUNTER — Ambulatory Visit (INDEPENDENT_AMBULATORY_CARE_PROVIDER_SITE_OTHER): Payer: 59 | Admitting: Internal Medicine

## 2014-07-08 ENCOUNTER — Encounter: Payer: Self-pay | Admitting: Internal Medicine

## 2014-07-08 ENCOUNTER — Ambulatory Visit (INDEPENDENT_AMBULATORY_CARE_PROVIDER_SITE_OTHER)
Admission: RE | Admit: 2014-07-08 | Discharge: 2014-07-08 | Disposition: A | Discharge status: Home / Self Care | Payer: 59 | Source: Ambulatory Visit | Attending: Internal Medicine | Admitting: Internal Medicine

## 2014-07-08 VITALS — BP 114/80 | HR 72 | Temp 98.2°F | Resp 16 | Ht 69.0 in | Wt 257.0 lb

## 2014-07-08 DIAGNOSIS — R059 Cough, unspecified: Secondary | ICD-10-CM | POA: Insufficient documentation

## 2014-07-08 DIAGNOSIS — J209 Acute bronchitis, unspecified: Secondary | ICD-10-CM

## 2014-07-08 DIAGNOSIS — R05 Cough: Secondary | ICD-10-CM

## 2014-07-08 DIAGNOSIS — D519 Vitamin B12 deficiency anemia, unspecified: Secondary | ICD-10-CM

## 2014-07-08 DIAGNOSIS — J45901 Unspecified asthma with (acute) exacerbation: Secondary | ICD-10-CM

## 2014-07-08 DIAGNOSIS — J4531 Mild persistent asthma with (acute) exacerbation: Secondary | ICD-10-CM

## 2014-07-08 DIAGNOSIS — D509 Iron deficiency anemia, unspecified: Secondary | ICD-10-CM

## 2014-07-08 DIAGNOSIS — J13 Pneumonia due to Streptococcus pneumoniae: Secondary | ICD-10-CM

## 2014-07-08 DIAGNOSIS — K219 Gastro-esophageal reflux disease without esophagitis: Secondary | ICD-10-CM

## 2014-07-08 DIAGNOSIS — D518 Other vitamin B12 deficiency anemias: Secondary | ICD-10-CM

## 2014-07-08 MED ORDER — AZITHROMYCIN 500 MG PO TABS: 500.0000 mg | ORAL_TABLET | Freq: Every day | ORAL | Status: DC

## 2014-07-08 MED ORDER — MOMETASONE FURO-FORMOTEROL FUM 100-5 MCG/ACT IN AERO: 2.0000 | INHALATION_SPRAY | Freq: Two times a day (BID) | RESPIRATORY_TRACT | Status: DC

## 2014-07-08 MED ORDER — METHYLPREDNISOLONE ACETATE 80 MG/ML IJ SUSP
120.0000 mg | Freq: Once | INTRAMUSCULAR | Status: AC
  Administered 2014-07-08: 120 mg via INTRAMUSCULAR

## 2014-07-08 MED ORDER — PROMETHAZINE-DM 6.25-15 MG/5ML PO SYRP: 5.0000 mL | ORAL_SOLUTION | Freq: Four times a day (QID) | ORAL | Status: DC | PRN

## 2014-07-08 MED ORDER — CYANOCOBALAMIN 1000 MCG/ML IJ SOLN
1000.0000 ug | Freq: Once | INTRAMUSCULAR | Status: AC
  Administered 2014-07-08: 1000 ug via INTRAMUSCULAR

## 2014-07-08 MED ORDER — CEFUROXIME AXETIL 500 MG PO TABS: 500.0000 mg | ORAL_TABLET | Freq: Two times a day (BID) | ORAL | Status: DC

## 2014-07-08 NOTE — Progress Notes (Signed)
   Subjective:    Patient ID: Gail Payne, female    DOB: [birth year], 43 y.o.   MRN: [id number]  Cough This is a new problem. The current episode started in the past 7 days. The problem has been gradually worsening. The problem occurs every few hours. The cough is productive of purulent sputum. Associated symptoms include shortness of breath and wheezing. Pertinent negatives include no chest pain, chills, ear congestion, ear pain, fever, headaches, heartburn, hemoptysis, myalgias, nasal congestion, postnasal drip, rash, rhinorrhea, sore throat, sweats or weight loss. Nothing aggravates the symptoms. She has tried OTC cough suppressant, steroid inhaler and a beta-agonist inhaler for the symptoms. The treatment provided mild relief. Her past medical history is significant for asthma. There is no history of bronchiectasis, bronchitis, COPD, emphysema, environmental allergies or pneumonia.      Review of Systems  Constitutional: Negative for fever, chills and weight loss.  HENT: Negative for ear pain, postnasal drip, rhinorrhea and sore throat.   Respiratory: Positive for cough, shortness of breath and wheezing. Negative for hemoptysis.   Cardiovascular: Negative for chest pain.  Gastrointestinal: Negative for heartburn.  Musculoskeletal: Negative for myalgias.  Skin: Negative for rash.  Allergic/Immunologic: Negative for environmental allergies.  Neurological: Negative for headaches.       Objective:   Physical Exam  Vitals reviewed. Constitutional: She is oriented to person, place, and time. She appears well-developed and well-nourished.  Non-toxic appearance. She does not have a sickly appearance. She does not appear ill. No distress.  HENT:  Head: Normocephalic and atraumatic.  Mouth/Throat: Oropharynx is clear and moist. No oropharyngeal exudate.  Eyes: Conjunctivae are normal. Right eye exhibits no discharge. Left eye exhibits no discharge. No scleral icterus.  Neck: Normal range  of motion. Neck supple. No JVD present. No tracheal deviation present. No thyromegaly present.  Cardiovascular: Normal rate, regular rhythm, normal heart sounds and intact distal pulses.  Exam reveals no gallop and no friction rub.   No murmur heard. Pulmonary/Chest: Effort normal. No accessory muscle usage or stridor. Not tachypneic. No respiratory distress. She has no decreased breath sounds. She has no wheezes. She has rhonchi in the right middle field and the left middle field. She has no rales. She exhibits no tenderness.  Abdominal: Soft. Bowel sounds are normal. She exhibits no distension and no mass. There is no tenderness. There is no rebound and no guarding.  Musculoskeletal: Normal range of motion. She exhibits no edema and no tenderness.  Lymphadenopathy:    She has no cervical adenopathy.  Neurological: She is oriented to person, place, and time.  Skin: Skin is warm and dry. No rash noted. She is not diaphoretic. No erythema. No pallor.  Psychiatric: She has a normal mood and affect. Her behavior is normal. Judgment and thought content normal.     Lab Results  Component Value Date   WBC 9.2 12/31/2013   HGB 11.6* 12/31/2013   HCT 35.5* 12/31/2013   PLT 405.0* 12/31/2013   GLUCOSE 82 08/27/2013   CHOL 157 08/27/2013   TRIG 58.0 08/27/2013   HDL 37.80* 08/27/2013   LDLCALC 108* 08/27/2013   ALT 17 08/27/2013   AST 17 08/27/2013   NA 138 08/27/2013   K 3.8 08/27/2013   CL 105 08/27/2013   CREATININE 0.6 08/27/2013   BUN 12 08/27/2013   CO2 28 08/27/2013   TSH 0.46 08/27/2013   HGBA1C 5.8 08/27/2013       Assessment & Plan:

## 2014-07-08 NOTE — Progress Notes (Signed)
Pre visit review using our clinic review tool, if applicable. No additional management support is needed unless otherwise documented below in the visit note. 

## 2014-07-08 NOTE — Patient Instructions (Signed)
Cough, Adult  A cough is a reflex that helps clear your throat and airways. It can help heal the body or may be a reaction to an irritated airway. A cough may only last 2 or 3 weeks (acute) or may last more than 8 weeks (chronic).  CAUSES Acute cough:  Viral or bacterial infections. Chronic cough:  Infections.  Allergies.  Asthma.  Post-nasal drip.  Smoking.  Heartburn or acid reflux.  Some medicines.  Chronic lung problems (COPD).  Cancer. SYMPTOMS   Cough.  Fever.  Chest pain.  Increased breathing rate.  High-pitched whistling sound when breathing (wheezing).  Colored mucus that you cough up (sputum). TREATMENT   A bacterial cough may be treated with antibiotic medicine.  A viral cough must run its course and will not respond to antibiotics.  Your caregiver may recommend other treatments if you have a chronic cough. HOME CARE INSTRUCTIONS   Only take over-the-counter or prescription medicines for pain, discomfort, or fever as directed by your caregiver. Use cough suppressants only as directed by your caregiver.  Use a cold steam vaporizer or humidifier in your bedroom or home to help loosen secretions.  Sleep in a semi-upright position if your cough is worse at night.  Rest as needed.  Stop smoking if you smoke. SEEK IMMEDIATE MEDICAL CARE IF:   You have pus in your sputum.  Your cough starts to worsen.  You cannot control your cough with suppressants and are losing sleep.  You begin coughing up blood.  You have difficulty breathing.  You develop pain which is getting worse or is uncontrolled with medicine.  You have a fever. MAKE SURE YOU:   Understand these instructions.  Will watch your condition.  Will get help right away if you are not doing well or get worse. Document Released: 05/24/2011 Document Revised: 02/17/2012 Document Reviewed: 05/24/2011 [employer] Patient Information 2015 [employer]. This information is not intended  to replace advice given to you by your health care provider. Make sure you discuss any questions you have with your health care provider.  

## 2014-07-09 NOTE — Assessment & Plan Note (Addendum)
Will treat the infection with zpak and ceftin Will control the cough phenergan-dm

## 2014-07-09 NOTE — Assessment & Plan Note (Signed)
Her CXR is + for PNA

## 2014-07-09 NOTE — Assessment & Plan Note (Signed)
She is having an exacerbation and has no been well controlled I gave her an injection of steriods today and have asked to change the Qvar to [facility]

## 2014-07-27 ENCOUNTER — Ambulatory Visit: Payer: 59 | Admitting: Internal Medicine

## 2014-08-05 ENCOUNTER — Other Ambulatory Visit (INDEPENDENT_AMBULATORY_CARE_PROVIDER_SITE_OTHER): Payer: 59

## 2014-08-05 ENCOUNTER — Ambulatory Visit (INDEPENDENT_AMBULATORY_CARE_PROVIDER_SITE_OTHER)
Admission: RE | Admit: 2014-08-05 | Discharge: 2014-08-05 | Disposition: A | Discharge status: Home / Self Care | Payer: 59 | Source: Ambulatory Visit | Attending: Internal Medicine | Admitting: Internal Medicine

## 2014-08-05 ENCOUNTER — Encounter: Payer: Self-pay | Admitting: Internal Medicine

## 2014-08-05 ENCOUNTER — Ambulatory Visit (INDEPENDENT_AMBULATORY_CARE_PROVIDER_SITE_OTHER): Payer: 59 | Admitting: Internal Medicine

## 2014-08-05 VITALS — BP 130/78 | HR 73 | Temp 98.7°F | Resp 16 | Ht 69.0 in | Wt 257.0 lb

## 2014-08-05 DIAGNOSIS — Z23 Encounter for immunization: Secondary | ICD-10-CM

## 2014-08-05 DIAGNOSIS — J13 Pneumonia due to Streptococcus pneumoniae: Secondary | ICD-10-CM

## 2014-08-05 DIAGNOSIS — R05 Cough: Secondary | ICD-10-CM

## 2014-08-05 DIAGNOSIS — D519 Vitamin B12 deficiency anemia, unspecified: Secondary | ICD-10-CM

## 2014-08-05 DIAGNOSIS — R059 Cough, unspecified: Secondary | ICD-10-CM

## 2014-08-05 DIAGNOSIS — D518 Other vitamin B12 deficiency anemias: Secondary | ICD-10-CM

## 2014-08-05 LAB — CBC WITH DIFFERENTIAL/PLATELET
Basophils Absolute: 0 10*3/uL (ref 0.0–0.1)
Basophils Relative: 0.3 % (ref 0.0–3.0)
Eosinophils Absolute: 0.5 10*3/uL (ref 0.0–0.7)
Eosinophils Relative: 4.1 % (ref 0.0–5.0)
HCT: 38.2 % (ref 36.0–46.0)
Hemoglobin: 12.4 g/dL (ref 12.0–15.0)
Lymphocytes Relative: 19.7 % (ref 12.0–46.0)
Lymphs Abs: 2.5 10*3/uL (ref 0.7–4.0)
MCHC: 32.6 g/dL (ref 30.0–36.0)
MCV: 87 fl (ref 78.0–100.0)
Monocytes Absolute: 0.6 10*3/uL (ref 0.1–1.0)
Monocytes Relative: 5 % (ref 3.0–12.0)
Neutro Abs: 8.9 10*3/uL — ABNORMAL HIGH (ref 1.4–7.7)
Neutrophils Relative %: 70.9 % (ref 43.0–77.0)
Platelets: 422 10*3/uL — ABNORMAL HIGH (ref 150.0–400.0)
RBC: 4.39 Mil/uL (ref 3.87–5.11)
RDW: 15.6 % — ABNORMAL HIGH (ref 11.5–15.5)
WBC: 12.5 10*3/uL — ABNORMAL HIGH (ref 4.0–10.5)

## 2014-08-05 LAB — BASIC METABOLIC PANEL
BUN: 14 mg/dL (ref 6–23)
CO2: 24 mEq/L (ref 19–32)
Calcium: 9 mg/dL (ref 8.4–10.5)
Chloride: 105 mEq/L (ref 96–112)
Creatinine, Ser: 0.7 mg/dL (ref 0.4–1.2)
GFR: 125.42 mL/min (ref >60.00)
Glucose, Bld: 108 mg/dL — ABNORMAL HIGH (ref 70–99)
Potassium: 3.8 mEq/L (ref 3.5–5.1)
Sodium: 137 mEq/L (ref 135–145)

## 2014-08-05 LAB — C-REACTIVE PROTEIN: CRP: 2.1 mg/dL (ref 0.5–20.0)

## 2014-08-05 LAB — SEDIMENTATION RATE: Sed Rate: 32 mm/hr — ABNORMAL HIGH (ref 0–22)

## 2014-08-05 MED ORDER — MOXIFLOXACIN HCL 400 MG PO TABS: 400.0000 mg | ORAL_TABLET | Freq: Every day | ORAL | Status: DC

## 2014-08-05 MED ORDER — CYANOCOBALAMIN 1000 MCG/ML IJ SOLN
1000.0000 ug | Freq: Once | INTRAMUSCULAR | Status: AC
  Administered 2014-08-05: 1000 ug via INTRAMUSCULAR

## 2014-08-05 NOTE — Progress Notes (Signed)
Pre visit review using our clinic review tool, if applicable. No additional management support is needed unless otherwise documented below in the visit note. 

## 2014-08-05 NOTE — Patient Instructions (Signed)

## 2014-08-05 NOTE — Assessment & Plan Note (Signed)
Her CXR has not improved and there are new infiltrates described on the left side I will order labs to look for inflammation/vasculitis, immunodeficiency, systemic infection, anemia I have ordered a CT scan to see what these infiltrates look like structurally

## 2014-08-05 NOTE — Progress Notes (Signed)
Subjective:    Patient ID: Gail Payne, female    DOB: Jun 27, 1971, 43 y.o.   MRN: [id number]  Cough This is a recurrent problem. The current episode started more than 1 month ago. The problem has been unchanged. The problem occurs every few hours. The cough is productive of purulent sputum. Associated symptoms include shortness of breath and wheezing. Pertinent negatives include no chest pain, chills, ear congestion, ear pain, fever, headaches, heartburn, hemoptysis, myalgias, nasal congestion, postnasal drip, rash, rhinorrhea, sore throat, sweats or weight loss. She has tried a beta-agonist inhaler, prescription cough suppressant, OTC cough suppressant and oral steroids for the symptoms. The treatment provided mild relief. Her past medical history is significant for asthma and pneumonia. There is no history of bronchiectasis, bronchitis, COPD, emphysema or environmental allergies.      Review of Systems  Constitutional: Negative.  Negative for fever, chills, weight loss, diaphoresis, activity change, appetite change, fatigue and unexpected weight change.  HENT: Negative.  Negative for ear pain, postnasal drip, rhinorrhea, sinus pressure and sore throat.   Eyes: Negative.   Respiratory: Positive for cough, shortness of breath and wheezing. Negative for apnea, hemoptysis, choking, chest tightness and stridor.   Cardiovascular: Negative.  Negative for chest pain, palpitations and leg swelling.  Gastrointestinal: Negative.  Negative for heartburn, nausea, abdominal pain, diarrhea and constipation.  Endocrine: Negative.   Genitourinary: Negative.   Musculoskeletal: Negative.  Negative for myalgias.  Skin: Negative.  Negative for color change, pallor and rash.  Allergic/Immunologic: Negative.  Negative for environmental allergies.  Neurological: Negative.  Negative for headaches.  Hematological: Negative.  Negative for adenopathy. Does not bruise/bleed easily.  Psychiatric/Behavioral:  Negative.        Objective:   Physical Exam  Vitals reviewed. Constitutional: She is oriented to person, place, and time. She appears well-developed and well-nourished.  Non-toxic appearance. She does not have a sickly appearance. She does not appear ill. No distress.  HENT:  Head: Atraumatic.  Mouth/Throat: No oropharyngeal exudate.  Eyes: Conjunctivae are normal.  Neck: Neck supple. No thyromegaly present.  Cardiovascular: Normal rate, regular rhythm, normal heart sounds and intact distal pulses.  Exam reveals no gallop and no friction rub.   No murmur heard. Pulmonary/Chest: Effort normal. No accessory muscle usage. Not tachypneic. No respiratory distress. She has no decreased breath sounds. She has no wheezes. She has no rhonchi. She has rales in the right lower field and the left lower field. She exhibits no tenderness.  Abdominal: Soft. Bowel sounds are normal. She exhibits no distension and no mass. There is no tenderness. There is no rebound and no guarding.  Musculoskeletal: Normal range of motion. She exhibits no edema and no tenderness.  Neurological: She is oriented to person, place, and time.  Skin: Skin is warm and dry. No rash noted. She is not diaphoretic. No erythema. No pallor.  Psychiatric: She has a normal mood and affect. Her behavior is normal. Judgment and thought content normal.    Lab Results  Component Value Date   WBC 9.2 12/31/2013   HGB 11.6* 12/31/2013   HCT 35.5* 12/31/2013   PLT 405.0* 12/31/2013   GLUCOSE 82 08/27/2013   CHOL 157 08/27/2013   TRIG 58.0 08/27/2013   HDL 37.80* 08/27/2013   LDLCALC 108* 08/27/2013   ALT 17 08/27/2013   AST 17 08/27/2013   NA 138 08/27/2013   K 3.8 08/27/2013   CL 105 08/27/2013   CREATININE 0.6 08/27/2013   BUN 12 08/27/2013  CO2 28 08/27/2013   TSH 0.46 08/27/2013   HGBA1C 5.8 08/27/2013        Assessment & Plan:

## 2014-08-05 NOTE — Assessment & Plan Note (Signed)
Her CXR has not improved. I will check a CT scan to see if there are structural lesions, inflammed vessels, AF levels, etc. Will change her antibiotics to avelox

## 2014-08-06 LAB — HIV ANTIBODY (ROUTINE TESTING W REFLEX): HIV 1&2 Ab, 4th Generation: NONREACTIVE

## 2014-08-09 ENCOUNTER — Ambulatory Visit (INDEPENDENT_AMBULATORY_CARE_PROVIDER_SITE_OTHER)
Admission: RE | Admit: 2014-08-09 | Discharge: 2014-08-09 | Disposition: A | Discharge status: Home / Self Care | Payer: 59 | Source: Ambulatory Visit | Attending: Internal Medicine | Admitting: Internal Medicine

## 2014-08-09 ENCOUNTER — Encounter: Payer: Self-pay | Admitting: Internal Medicine

## 2014-08-09 DIAGNOSIS — J13 Pneumonia due to Streptococcus pneumoniae: Secondary | ICD-10-CM

## 2014-08-09 DIAGNOSIS — R059 Cough, unspecified: Secondary | ICD-10-CM

## 2014-08-09 DIAGNOSIS — R05 Cough: Secondary | ICD-10-CM

## 2014-08-09 LAB — PROTEIN ELECTROPHORESIS, SERUM
Albumin ELP: 51.6 % — ABNORMAL LOW (ref 55.8–66.1)
Alpha-1-Globulin: 4.7 % (ref 2.9–4.9)
Alpha-2-Globulin: 11.2 % (ref 7.1–11.8)
Beta 2: 4.8 % (ref 3.2–6.5)
Beta Globulin: 6 % (ref 4.7–7.2)
Gamma Globulin: 21.7 % — ABNORMAL HIGH (ref 11.1–18.8)
Total Protein, Serum Electrophoresis: 6.7 g/dL (ref 6.0–8.3)

## 2014-08-09 MED ORDER — IOHEXOL 300 MG/ML  SOLN
80.0000 mL | Freq: Once | INTRAMUSCULAR | Status: AC | PRN
  Administered 2014-08-09: 80 mL via INTRAVENOUS

## 2014-09-15 ENCOUNTER — Ambulatory Visit (INDEPENDENT_AMBULATORY_CARE_PROVIDER_SITE_OTHER): Payer: 59 | Admitting: Internal Medicine

## 2014-09-15 ENCOUNTER — Encounter: Payer: Self-pay | Admitting: Internal Medicine

## 2014-09-15 ENCOUNTER — Ambulatory Visit: Payer: 59 | Admitting: Internal Medicine

## 2014-09-15 VITALS — BP 128/86 | HR 80 | Temp 98.6°F | Resp 16 | Ht 69.0 in | Wt 257.0 lb

## 2014-09-15 DIAGNOSIS — D519 Vitamin B12 deficiency anemia, unspecified: Secondary | ICD-10-CM

## 2014-09-15 DIAGNOSIS — J4530 Mild persistent asthma, uncomplicated: Secondary | ICD-10-CM

## 2014-09-15 MED ORDER — MOMETASONE FURO-FORMOTEROL FUM 200-5 MCG/ACT IN AERO: 2.0000 | INHALATION_SPRAY | Freq: Two times a day (BID) | RESPIRATORY_TRACT | Status: DC

## 2014-09-15 MED ORDER — MONTELUKAST SODIUM 10 MG PO TABS
10.0000 mg | ORAL_TABLET | Freq: Every day | ORAL | Status: DC
Start: 2014-09-15 — End: 2015-11-10

## 2014-09-15 MED ORDER — CYANOCOBALAMIN 1000 MCG/ML IJ SOLN
1000.0000 ug | Freq: Once | INTRAMUSCULAR | Status: AC
  Administered 2014-09-15: 1000 ug via INTRAMUSCULAR

## 2014-09-15 NOTE — Patient Instructions (Signed)

## 2014-09-15 NOTE — Progress Notes (Signed)
Subjective:    Patient ID: Gail Payne, female    DOB: 09-20-1971, 43 y.o.   MRN: [id number]  Asthma She complains of cough, sputum production (clear phlegm) and wheezing. There is no chest tightness, difficulty breathing, frequent throat clearing, hemoptysis, hoarse voice or shortness of breath. This is a recurrent problem. The current episode started more than 1 year ago. The problem occurs intermittently. The problem has been waxing and waning. Pertinent negatives include no appetite change, chest pain, dyspnea on exertion, ear congestion, ear pain, fever, headaches, heartburn, malaise/fatigue, myalgias, nasal congestion, orthopnea, PND, postnasal drip, rhinorrhea, sneezing, sore throat, sweats, trouble swallowing or weight loss. Her symptoms are alleviated by beta-agonist and steroid inhaler. She reports moderate improvement on treatment. Her past medical history is significant for asthma.      Review of Systems  Constitutional: Negative.  Negative for fever, chills, weight loss, malaise/fatigue, diaphoresis, appetite change and fatigue.  HENT: Negative for ear pain, hoarse voice, postnasal drip, rhinorrhea, sneezing, sore throat, trouble swallowing and voice change.   Eyes: Negative.   Respiratory: Positive for cough, sputum production (clear phlegm) and wheezing. Negative for apnea, hemoptysis, choking, chest tightness, shortness of breath and stridor.   Cardiovascular: Negative.  Negative for chest pain, dyspnea on exertion, palpitations, leg swelling and PND.  Gastrointestinal: Negative.  Negative for heartburn, nausea, vomiting, abdominal pain, diarrhea, constipation and blood in stool.  Endocrine: Negative.   Genitourinary: Negative.   Musculoskeletal: Negative.  Negative for myalgias.  Skin: Negative.  Negative for rash.  Allergic/Immunologic: Negative.   Neurological: Negative.  Negative for headaches.  Hematological: Negative.  Negative for adenopathy. Does not bruise/bleed  easily.  Psychiatric/Behavioral: Negative.        Objective:   Physical Exam  Vitals reviewed. Constitutional: She is oriented to person, place, and time. She appears well-developed and well-nourished.  Non-toxic appearance. She does not have a sickly appearance. She does not appear ill. No distress.  HENT:  Head: Normocephalic and atraumatic.  Mouth/Throat: Oropharynx is clear and moist. No oropharyngeal exudate.  Eyes: Conjunctivae are normal. Right eye exhibits no discharge. Left eye exhibits no discharge. No scleral icterus.  Neck: Normal range of motion. Neck supple. No JVD present. No tracheal deviation present. No thyromegaly present.  Cardiovascular: Normal rate, regular rhythm, normal heart sounds and intact distal pulses.  Exam reveals no gallop and no friction rub.   No murmur heard. Pulmonary/Chest: Effort normal and breath sounds normal. No stridor. No respiratory distress. She has no wheezes. She has no rales. She exhibits no tenderness.  Abdominal: Soft. Bowel sounds are normal. She exhibits no distension and no mass. There is no tenderness. There is no rebound and no guarding.  Musculoskeletal: Normal range of motion. She exhibits no edema and no tenderness.  Lymphadenopathy:    She has no cervical adenopathy.  Neurological: She is oriented to person, place, and time.  Skin: Skin is warm and dry. No rash noted. She is not diaphoretic. No erythema. No pallor.     Lab Results  Component Value Date   WBC 12.5* 08/05/2014   HGB 12.4 08/05/2014   HCT 38.2 08/05/2014   PLT 422.0* 08/05/2014   GLUCOSE 108* 08/05/2014   CHOL 157 08/27/2013   TRIG 58.0 08/27/2013   HDL 37.80* 08/27/2013   LDLCALC 108* 08/27/2013   ALT 17 08/27/2013   AST 17 08/27/2013   NA 137 08/05/2014   K 3.8 08/05/2014   CL 105 08/05/2014   CREATININE 0.7 08/05/2014  BUN 14 08/05/2014   CO2 24 08/05/2014   TSH 0.46 08/27/2013   HGBA1C 5.8 08/27/2013       Assessment & Plan:

## 2014-09-18 NOTE — Assessment & Plan Note (Signed)
She has persistent symptoms and desires better control of the wheezing Will add singulair and increase the strength of the dulera She will cont using albuterol inhaler as needed

## 2014-09-21 LAB — HM PAP SMEAR

## 2014-10-04 ENCOUNTER — Other Ambulatory Visit: Payer: Self-pay | Admitting: Obstetrics and Gynecology

## 2014-10-05 LAB — CYTOLOGY - PAP

## 2014-12-16 ENCOUNTER — Ambulatory Visit: Payer: Self-pay

## 2014-12-21 ENCOUNTER — Ambulatory Visit (INDEPENDENT_AMBULATORY_CARE_PROVIDER_SITE_OTHER): Payer: 59 | Admitting: *Deleted

## 2014-12-21 DIAGNOSIS — E538 Deficiency of other specified B group vitamins: Secondary | ICD-10-CM

## 2014-12-21 MED ORDER — CYANOCOBALAMIN 1000 MCG/ML IJ SOLN
1000.0000 ug | Freq: Once | INTRAMUSCULAR | Status: AC
  Administered 2014-12-21: 1000 ug via INTRAMUSCULAR

## 2014-12-30 ENCOUNTER — Ambulatory Visit: Payer: 59 | Admitting: Internal Medicine

## 2015-01-13 ENCOUNTER — Ambulatory Visit (INDEPENDENT_AMBULATORY_CARE_PROVIDER_SITE_OTHER): Payer: 59 | Admitting: Internal Medicine

## 2015-01-13 ENCOUNTER — Encounter: Payer: Self-pay | Admitting: Internal Medicine

## 2015-01-13 ENCOUNTER — Ambulatory Visit (INDEPENDENT_AMBULATORY_CARE_PROVIDER_SITE_OTHER)
Admission: RE | Admit: 2015-01-13 | Discharge: 2015-01-13 | Disposition: A | Discharge status: Home / Self Care | Payer: 59 | Source: Ambulatory Visit | Attending: Internal Medicine | Admitting: Internal Medicine

## 2015-01-13 VITALS — BP 138/84 | HR 65 | Temp 98.1°F | Resp 16 | Ht 69.0 in | Wt 263.2 lb

## 2015-01-13 DIAGNOSIS — R059 Cough, unspecified: Secondary | ICD-10-CM

## 2015-01-13 DIAGNOSIS — J4530 Mild persistent asthma, uncomplicated: Secondary | ICD-10-CM

## 2015-01-13 DIAGNOSIS — R05 Cough: Secondary | ICD-10-CM

## 2015-01-13 DIAGNOSIS — J13 Pneumonia due to Streptococcus pneumoniae: Secondary | ICD-10-CM

## 2015-01-13 MED ORDER — LEVOFLOXACIN 750 MG PO TABS: 750.0000 mg | ORAL_TABLET | Freq: Every day | ORAL | Status: AC

## 2015-01-13 NOTE — Progress Notes (Signed)
   Subjective:    Patient ID: Gail Payne, female    DOB: 03-Jul-1971, 44 y.o.   MRN: [id number]  Cough This is a recurrent problem. The current episode started 1 to 4 weeks ago. The problem has been gradually worsening. The cough is productive of purulent sputum. Associated symptoms include shortness of breath and wheezing. Pertinent negatives include no chest pain, chills, ear congestion, ear pain, fever, headaches, heartburn, hemoptysis, myalgias, nasal congestion, postnasal drip, rash, rhinorrhea, sore throat, sweats or weight loss. She has tried steroid inhaler and a beta-agonist inhaler for the symptoms. The treatment provided mild relief. Her past medical history is significant for asthma, bronchiectasis and pneumonia. There is no history of bronchitis, COPD, emphysema or environmental allergies.      Review of Systems  Constitutional: Negative.  Negative for fever, chills, weight loss, diaphoresis, appetite change and fatigue.  HENT: Negative.  Negative for ear pain, postnasal drip, rhinorrhea and sore throat.   Eyes: Negative.   Respiratory: Positive for cough, shortness of breath and wheezing. Negative for apnea, hemoptysis, choking, chest tightness and stridor.   Cardiovascular: Negative.  Negative for chest pain, palpitations and leg swelling.  Gastrointestinal: Negative.  Negative for heartburn, nausea, vomiting, abdominal pain, diarrhea, constipation and blood in stool.  Endocrine: Negative.   Genitourinary: Negative.   Musculoskeletal: Negative.  Negative for myalgias.  Skin: Negative.  Negative for rash.  Allergic/Immunologic: Negative.  Negative for environmental allergies.  Neurological: Negative.  Negative for headaches.  Hematological: Negative.  Negative for adenopathy. Does not bruise/bleed easily.  Psychiatric/Behavioral: Negative.        Objective:   Physical Exam  Constitutional: She is oriented to person, place, and time. She appears well-developed and  well-nourished. No distress.  HENT:  Head: Normocephalic and atraumatic.  Mouth/Throat: Oropharynx is clear and moist. No oropharyngeal exudate.  Eyes: Conjunctivae are normal. Right eye exhibits no discharge. Left eye exhibits no discharge. No scleral icterus.  Neck: Normal range of motion. Neck supple. No JVD present. No tracheal deviation present. No thyromegaly present.  Cardiovascular: Normal rate, normal heart sounds and intact distal pulses.  Exam reveals no gallop and no friction rub.   No murmur heard. Pulmonary/Chest: Effort normal. No accessory muscle usage or stridor. No respiratory distress. She has no decreased breath sounds. She has no wheezes. She has rhonchi in the right upper field and the left upper field. She has no rales. She exhibits no tenderness.  Abdominal: Soft. Bowel sounds are normal. She exhibits no distension and no mass. There is no tenderness. There is no rebound and no guarding.  Musculoskeletal: Normal range of motion. She exhibits no edema or tenderness.  Lymphadenopathy:    She has no cervical adenopathy.  Neurological: She is oriented to person, place, and time.  Skin: Skin is warm and dry. No rash noted. She is not diaphoretic. No erythema. No pallor.  Nursing note and vitals reviewed.         Assessment & Plan:

## 2015-01-13 NOTE — Patient Instructions (Signed)
Cough, Adult  A cough is a reflex that helps clear your throat and airways. It can help heal the body or may be a reaction to an irritated airway. A cough may only last 2 or 3 weeks (acute) or may last more than 8 weeks (chronic).  CAUSES Acute cough:  Viral or bacterial infections. Chronic cough:  Infections.  Allergies.  Asthma.  Post-nasal drip.  Smoking.  Heartburn or acid reflux.  Some medicines.  Chronic lung problems (COPD).  Cancer. SYMPTOMS   Cough.  Fever.  Chest pain.  Increased breathing rate.  High-pitched whistling sound when breathing (wheezing).  Colored mucus that you cough up (sputum). TREATMENT   A bacterial cough may be treated with antibiotic medicine.  A viral cough must run its course and will not respond to antibiotics.  Your caregiver may recommend other treatments if you have a chronic cough. HOME CARE INSTRUCTIONS   Only take over-the-counter or prescription medicines for pain, discomfort, or fever as directed by your caregiver. Use cough suppressants only as directed by your caregiver.  Use a cold steam vaporizer or humidifier in your bedroom or home to help loosen secretions.  Sleep in a semi-upright position if your cough is worse at night.  Rest as needed.  Stop smoking if you smoke. SEEK IMMEDIATE MEDICAL CARE IF:   You have pus in your sputum.  Your cough starts to worsen.  You cannot control your cough with suppressants and are losing sleep.  You begin coughing up blood.  You have difficulty breathing.  You develop pain which is getting worse or is uncontrolled with medicine.  You have a fever. MAKE SURE YOU:   Understand these instructions.  Will watch your condition.  Will get help right away if you are not doing well or get worse. Document Released: 05/24/2011 Document Revised: 02/17/2012 Document Reviewed: 05/24/2011 [employer] Patient Information 2015 [employer]. This information is not intended  to replace advice given to you by your health care provider. Make sure you discuss any questions you have with your health care provider.  

## 2015-01-13 NOTE — Progress Notes (Signed)
Pre visit review using our clinic review tool, if applicable. No additional management support is needed unless otherwise documented below in the visit note. 

## 2015-01-15 ENCOUNTER — Other Ambulatory Visit: Payer: Self-pay | Admitting: Internal Medicine

## 2015-01-16 DIAGNOSIS — J13 Pneumonia due to Streptococcus pneumoniae: Secondary | ICD-10-CM | POA: Insufficient documentation

## 2015-01-16 NOTE — Assessment & Plan Note (Signed)
She has persistent s/s of asthma with cough, wheezing, SOB despite max therapy for asthma I have therefore asked her to see pulmonary medicine

## 2015-01-16 NOTE — Assessment & Plan Note (Signed)
She showed me a picture today of a prior bronchoscopy that appears to show a stump with a plug of mucous (? Bronchiectasis), her repeat CXR shows atelectasis in the left lingula, will start , levaquin to treat the infection

## 2015-01-20 ENCOUNTER — Ambulatory Visit: Payer: 59

## 2015-02-02 ENCOUNTER — Ambulatory Visit: Payer: 59 | Admitting: Internal Medicine

## 2015-02-03 ENCOUNTER — Ambulatory Visit: Payer: 59 | Admitting: Internal Medicine

## 2015-02-08 ENCOUNTER — Other Ambulatory Visit: Payer: Self-pay | Admitting: Internal Medicine

## 2015-02-17 ENCOUNTER — Other Ambulatory Visit: Payer: Self-pay | Admitting: Internal Medicine

## 2015-02-22 ENCOUNTER — Ambulatory Visit (INDEPENDENT_AMBULATORY_CARE_PROVIDER_SITE_OTHER): Payer: 59 | Admitting: Podiatry

## 2015-02-22 ENCOUNTER — Encounter: Payer: Self-pay | Admitting: Podiatry

## 2015-02-22 VITALS — BP 161/85 | HR 64 | Resp 16

## 2015-02-22 DIAGNOSIS — M722 Plantar fascial fibromatosis: Secondary | ICD-10-CM

## 2015-02-22 DIAGNOSIS — M79672 Pain in left foot: Secondary | ICD-10-CM

## 2015-02-22 MED ORDER — TRIAMCINOLONE ACETONIDE 10 MG/ML IJ SUSP
10.0000 mg | Freq: Once | INTRAMUSCULAR | Status: AC
  Administered 2015-02-22: 10 mg

## 2015-02-22 NOTE — Patient Instructions (Signed)

## 2015-02-22 NOTE — Progress Notes (Signed)
   Subjective:    Patient ID: Gail Payne, female    DOB: November 17, 1971, 43 y.o.   MRN: [id number]  HPI  Pt presents with left foot pain, constant, dull and achey. Worsens with proloonged standing and walking. Taking meloxicam  Review of Systems  All other systems reviewed and are negative.      Objective:   Physical Exam        Assessment & Plan:

## 2015-02-23 NOTE — Progress Notes (Signed)
Subjective:     Patient ID: Gail Payne, female   DOB: [birth year], 44 y.o.   MRN: [id number]  HPI patient presents stating I have constant pain in my left heel. Been going on for a long time and I've had previous injections I've had previous night splint I've had previous soft orthotics and other modalities without relief of my symptoms. States that this has been ongoing for a significant. Time and the pain is becoming difficult to manage   Review of Systems     Objective:   Physical Exam  Constitutional: She is oriented to person, place, and time.  Cardiovascular: Intact distal pulses.   Musculoskeletal: Normal range of motion.  Neurological: She is oriented to person, place, and time.  Skin: Skin is warm.  Nursing note and vitals reviewed.  neurovascular status intact muscle strength adequate with range of motion subtalar midtarsal joint within normal limits. Patient is noted to have discomfort of a significant nature medial band of the left plantar fascia with fluid buildup and moderate depression of the arch upon weightbearing. Patient's digits are well-perfused and she is well oriented 3 at this time     Assessment:     Chronic plantar fasciitis which is had numerous treatments in the past without current success and pain of a significant nature    Plan:     H&P and condition discussed with patient. I've recommended we try 1 more conservative care regimen and if improvement not forthcoming we'll need to consider surgery. I did reinject the plantar fascia 3 Mill grams Kenalog 5 g Xylocaine I and scanned her for a new type of an orthotic and dispensed fascial brace. Reappoint when orthotics returned to discuss response

## 2015-02-24 ENCOUNTER — Ambulatory Visit (INDEPENDENT_AMBULATORY_CARE_PROVIDER_SITE_OTHER): Payer: 59 | Admitting: Pulmonary Disease

## 2015-02-24 ENCOUNTER — Encounter: Payer: Self-pay | Admitting: Pulmonary Disease

## 2015-02-24 VITALS — BP 124/70 | HR 64 | Temp 98.6°F | Ht 65.0 in | Wt 271.0 lb

## 2015-02-24 DIAGNOSIS — R058 Other specified cough: Secondary | ICD-10-CM

## 2015-02-24 DIAGNOSIS — J4540 Moderate persistent asthma, uncomplicated: Secondary | ICD-10-CM

## 2015-02-24 DIAGNOSIS — R05 Cough: Secondary | ICD-10-CM

## 2015-02-24 MED ORDER — OMEPRAZOLE 40 MG PO CPDR: DELAYED_RELEASE_CAPSULE | ORAL | Status: DC

## 2015-02-24 NOTE — Assessment & Plan Note (Signed)
The patient is describing 2 types of cough, one being more lower airway and the other being more upper airway. The cough that is dry and hacking in nature, and worsened by laughing and prolonged conversation, is most certainly upper airway in origin. She denies having any significant reflux, but is at risk for LPR given her obesity. She also has postnasal drip and seasonal allergy issues. I would like to treat her for the causes of upper airway cough, and see if we can improve things.

## 2015-02-24 NOTE — Patient Instructions (Signed)
Will start on omeprazole 40mg  one each am before eating to see if this will help with cough Take zyrtec 10mg  one each day for allergies until I see you again. You need to take dulera twice a day everyday no matter how you feel. Stay on singulair for now. followup with me again in 4 weeks.

## 2015-02-24 NOTE — Assessment & Plan Note (Signed)
The patient has a long history of asthma, and it sounds like her episode in 2002 was associated with severe airway inflammation with significant mucus plugging. Her spirometry today and also her exhaled nitric oxide are completely normal, and therefore I do not think her current symptoms are related to asthma. Her dyspnea on exertion may simply be related to her obesity and deconditioning, and she tells me that her dyspnea usually resolves with a short rest. I have asked her not to use her rescue inhaler unless she has persistent dyspnea after resting for a period of time. I have asked her to take her dulera compliantly twice a day for the next 4 weeks, and we will see if her symptoms resolve with treatment of upper airway cough.  If she has progressive symptoms that we believe her truly related to lower airways disease, I think the next step would be an allergy evaluation/testing.

## 2015-02-24 NOTE — Progress Notes (Signed)
Subjective:    Patient ID: Gail Payne, female    DOB: 1970-12-13, 44 y.o.   MRN: [id number]  HPI The patient is a 44 year old female who I've been asked to see for management of asthma. The patient tells me that she was first diagnosed as a child, but grew out of it during young adulthood. In 2002 she started having increasing breathing issues as well as cough, and was evaluated by Dr. Alva Garnet with the finding of persistent bilateral lower lobe infiltrates despite being treated with antibiotics. She ultimately underwent bronchoscopy where severe white fibrinous plugs were noted in the lower lobe airways.  These were suctioned and sent for cytology, with cultures being unremarkable and pathology showing only inflammatory material. The patient was started on aggressive asthma medication, and feels that she has done "okay" over the years. In October of last year she started noticing progressive shortness of breath, as well as 2 types of cough. One was dry and hacking in nature and more upper airway. This cough would be triggered by laughing, noxious odors, and prolonged conversation. She would also have another type of cough objective of excessive mucus that is sometimes discolored. The patient is currently being treated with dulera at high dose, but does miss afternoon doses. She is also been on Singulair since last fall, and thinks that it may have helped her. She had a chest x-ray last month that was totally clear, but has not had recent spirometry. She had a CBC last year that did not show increased eosinophils. The patient has never had allergy testing, but does have a lot of issues with spurring and fall allergies. She currently has postnasal drip, but denies reflux symptoms. She does not think that she has been treated with prednisone in quite some time.   Review of Systems  Constitutional: Negative for fever, chills and unexpected weight change.  HENT: Positive for congestion, postnasal drip  and sneezing. Negative for dental problem, ear pain, nosebleeds, rhinorrhea, sinus pressure, sore throat, trouble swallowing and voice change.   Eyes: Negative for redness, itching and visual disturbance.  Respiratory: Positive for cough, chest tightness, shortness of breath and wheezing. Negative for choking.   Cardiovascular: Negative for chest pain, palpitations and leg swelling.  Gastrointestinal: Negative for nausea, vomiting, abdominal pain and diarrhea.  Genitourinary: Negative for dysuria and difficulty urinating.  Musculoskeletal: Negative for joint swelling and arthralgias.  Skin: Negative for rash.  Neurological: Negative for tremors, syncope and headaches.  Hematological: Does not bruise/bleed easily.  Psychiatric/Behavioral: Negative for dysphoric mood. The patient is not nervous/anxious.        Objective:   Physical Exam Constitutional:  Obese female , no acute distress  HENT:  Nares patent without discharge, swollen turbinates  Oropharynx without exudate, palate and uvula are elongated.   Eyes:  Perrla, eomi, no scleral icterus  Neck:  No JVD, no TMG  Cardiovascular:  Normal rate, regular rhythm, no rubs or gallops.  No murmurs        Intact distal pulses  Pulmonary :  Normal breath sounds, no stridor or respiratory distress   No rales, rhonchi, or wheezing.  Prominent upper airway pseudowheezing  Abdominal:  Soft, nondistended, bowel sounds present.  No tenderness noted.   Musculoskeletal:  mild lower extremity edema noted, brace on right ankle  Lymph Nodes:  No cervical lymphadenopathy noted  Skin:  No cyanosis noted  Neurologic:  Alert, appropriate, moves all 4 extremities without obvious deficit.  Assessment & Plan:

## 2015-03-07 ENCOUNTER — Ambulatory Visit (INDEPENDENT_AMBULATORY_CARE_PROVIDER_SITE_OTHER): Payer: 59 | Admitting: *Deleted

## 2015-03-07 DIAGNOSIS — E538 Deficiency of other specified B group vitamins: Secondary | ICD-10-CM | POA: Diagnosis not present

## 2015-03-07 MED ORDER — CYANOCOBALAMIN 1000 MCG/ML IJ SOLN
1000.0000 ug | Freq: Once | INTRAMUSCULAR | Status: AC
  Administered 2015-03-07: 1000 ug via INTRAMUSCULAR

## 2015-03-16 ENCOUNTER — Ambulatory Visit: Payer: 59 | Admitting: Podiatry

## 2015-03-24 ENCOUNTER — Ambulatory Visit: Payer: 59 | Admitting: Pulmonary Disease

## 2015-04-07 ENCOUNTER — Ambulatory Visit (INDEPENDENT_AMBULATORY_CARE_PROVIDER_SITE_OTHER): Payer: 59

## 2015-04-07 DIAGNOSIS — D519 Vitamin B12 deficiency anemia, unspecified: Secondary | ICD-10-CM

## 2015-04-07 MED ORDER — CYANOCOBALAMIN 1000 MCG/ML IJ SOLN
1000.0000 ug | Freq: Once | INTRAMUSCULAR | Status: AC
  Administered 2015-04-07: 1000 ug via INTRAMUSCULAR

## 2015-07-01 ENCOUNTER — Other Ambulatory Visit: Payer: Self-pay | Admitting: Internal Medicine

## 2015-07-07 ENCOUNTER — Other Ambulatory Visit: Payer: Self-pay | Admitting: Internal Medicine

## 2015-08-07 ENCOUNTER — Telehealth: Payer: Self-pay

## 2015-08-07 NOTE — Telephone Encounter (Signed)
PA initiated via covermymeds KEY: [id number]

## 2015-08-09 NOTE — Telephone Encounter (Signed)
Pa approved through 08/06/16. Pharmacy notified

## 2015-11-08 ENCOUNTER — Encounter: Payer: Self-pay | Admitting: Internal Medicine

## 2015-11-08 ENCOUNTER — Telehealth: Payer: Self-pay | Admitting: Internal Medicine

## 2015-11-08 ENCOUNTER — Other Ambulatory Visit (INDEPENDENT_AMBULATORY_CARE_PROVIDER_SITE_OTHER): Payer: BLUE CROSS/BLUE SHIELD

## 2015-11-08 ENCOUNTER — Ambulatory Visit (INDEPENDENT_AMBULATORY_CARE_PROVIDER_SITE_OTHER): Payer: BLUE CROSS/BLUE SHIELD | Admitting: Internal Medicine

## 2015-11-08 VITALS — BP 118/82 | HR 61 | Temp 98.4°F | Resp 16 | Ht 65.0 in | Wt 258.0 lb

## 2015-11-08 DIAGNOSIS — Z0000 Encounter for general adult medical examination without abnormal findings: Secondary | ICD-10-CM

## 2015-11-08 DIAGNOSIS — R7309 Other abnormal glucose: Secondary | ICD-10-CM | POA: Diagnosis not present

## 2015-11-08 DIAGNOSIS — D519 Vitamin B12 deficiency anemia, unspecified: Secondary | ICD-10-CM | POA: Diagnosis not present

## 2015-11-08 DIAGNOSIS — M170 Bilateral primary osteoarthritis of knee: Secondary | ICD-10-CM

## 2015-11-08 DIAGNOSIS — Z23 Encounter for immunization: Secondary | ICD-10-CM

## 2015-11-08 LAB — CBC WITH DIFFERENTIAL/PLATELET
Basophils Absolute: 0 10*3/uL (ref 0.0–0.1)
Basophils Relative: 0.3 % (ref 0.0–3.0)
Eosinophils Absolute: 0.2 10*3/uL (ref 0.0–0.7)
Eosinophils Relative: 2 % (ref 0.0–5.0)
HCT: 36.9 % (ref 36.0–46.0)
Hemoglobin: 12 g/dL (ref 12.0–15.0)
Lymphocytes Relative: 24.9 % (ref 12.0–46.0)
Lymphs Abs: 2.6 10*3/uL (ref 0.7–4.0)
MCHC: 32.5 g/dL (ref 30.0–36.0)
MCV: 86.8 fl (ref 78.0–100.0)
Monocytes Absolute: 0.6 10*3/uL (ref 0.1–1.0)
Monocytes Relative: 5.6 % (ref 3.0–12.0)
Neutro Abs: 7.1 10*3/uL (ref 1.4–7.7)
Neutrophils Relative %: 67.2 % (ref 43.0–77.0)
Platelets: 375 10*3/uL (ref 150.0–400.0)
RBC: 4.25 Mil/uL (ref 3.87–5.11)
RDW: 14.9 % (ref 11.5–15.5)
WBC: 10.6 10*3/uL — ABNORMAL HIGH (ref 4.0–10.5)

## 2015-11-08 LAB — COMPREHENSIVE METABOLIC PANEL
ALT: 19 U/L (ref 0–35)
AST: 16 U/L (ref 0–37)
Albumin: 3.7 g/dL (ref 3.5–5.2)
Alkaline Phosphatase: 74 U/L (ref 39–117)
BUN: 15 mg/dL (ref 6–23)
CO2: 28 mEq/L (ref 19–32)
Calcium: 9.4 mg/dL (ref 8.4–10.5)
Chloride: 104 mEq/L (ref 96–112)
Creatinine, Ser: 0.67 mg/dL (ref 0.40–1.20)
GFR: 122.55 mL/min (ref >60.00)
Glucose, Bld: 104 mg/dL — ABNORMAL HIGH (ref 70–99)
Potassium: 3.8 mEq/L (ref 3.5–5.1)
Sodium: 139 mEq/L (ref 135–145)
Total Bilirubin: 0.8 mg/dL (ref 0.2–1.2)
Total Protein: 7.6 g/dL (ref 6.0–8.3)

## 2015-11-08 LAB — LIPID PANEL
Cholesterol: 148 mg/dL (ref 0–200)
HDL: 33.5 mg/dL — ABNORMAL LOW (ref >39.00)
LDL Cholesterol: 100 mg/dL — ABNORMAL HIGH (ref 0–99)
NonHDL: 114.41
Total CHOL/HDL Ratio: 4
Triglycerides: 74 mg/dL (ref 0.0–149.0)
VLDL: 14.8 mg/dL (ref 0.0–40.0)

## 2015-11-08 LAB — TSH: TSH: 1.12 u[IU]/mL (ref 0.35–4.50)

## 2015-11-08 LAB — HEMOGLOBIN A1C: Hgb A1c MFr Bld: 5.6 % (ref 4.6–6.5)

## 2015-11-08 LAB — HM MAMMOGRAPHY

## 2015-11-08 MED ORDER — CYANOCOBALAMIN 1000 MCG/ML IJ SOLN
1000.0000 ug | Freq: Once | INTRAMUSCULAR | Status: AC
  Administered 2015-11-08: 1000 ug via INTRAMUSCULAR

## 2015-11-08 MED ORDER — FERROUS SULFATE 325 (65 FE) MG PO TABS: ORAL_TABLET | ORAL | Status: DC

## 2015-11-08 MED ORDER — MELOXICAM 15 MG PO TABS: 15.0000 mg | ORAL_TABLET | Freq: Every day | ORAL | Status: DC

## 2015-11-08 NOTE — Telephone Encounter (Signed)
Patient states that her last mammogram was in November of 2015 at [facility] with Dr. Orvan Seen.  Next is scheduled for 11/23/15.  Call patient if anymore questions.

## 2015-11-08 NOTE — Telephone Encounter (Signed)
noted 

## 2015-11-08 NOTE — Progress Notes (Signed)
Pre visit review using our clinic review tool, if applicable. No additional management support is needed unless otherwise documented below in the visit note. 

## 2015-11-08 NOTE — Patient Instructions (Signed)
Preventive Care for Adults, Female A healthy lifestyle and preventive care can promote health and wellness. Preventive health guidelines for women include the following key practices.  A routine yearly physical is a good way to check with your health care provider about your health and preventive screening. It is a chance to share any concerns and updates on your health and to receive a thorough exam.  Visit your dentist for a routine exam and preventive care every 6 months. Brush your teeth twice a day and floss once a day. Good oral hygiene prevents tooth decay and gum disease.  The frequency of eye exams is based on your age, health, family medical history, use of contact lenses, and other factors. Follow your health care provider's recommendations for frequency of eye exams.  Eat a healthy diet. Foods like vegetables, fruits, whole grains, low-fat dairy products, and lean protein foods contain the nutrients you need without too many calories. Decrease your intake of foods high in solid fats, added sugars, and salt. Eat the right amount of calories for you.Get information about a proper diet from your health care provider, if necessary.  Regular physical exercise is one of the most important things you can do for your health. Most adults should get at least 150 minutes of moderate-intensity exercise (any activity that increases your heart rate and causes you to sweat) each week. In addition, most adults need muscle-strengthening exercises on 2 or more days a week.  Maintain a healthy weight. The body mass index (BMI) is a screening tool to identify possible weight problems. It provides an estimate of body fat based on height and weight. Your health care provider can find your BMI and can help you achieve or maintain a healthy weight.For adults 20 years and older:  A BMI below 18.5 is considered underweight.  A BMI of 18.5 to 24.9 is normal.  A BMI of 25 to 29.9 is considered overweight.  A  BMI of 30 and above is considered obese.  Maintain normal blood lipids and cholesterol levels by exercising and minimizing your intake of saturated fat. Eat a balanced diet with plenty of fruit and vegetables. Blood tests for lipids and cholesterol should begin at age 45 and be repeated every 5 years. If your lipid or cholesterol levels are high, you are over 50, or you are at high risk for heart disease, you may need your cholesterol levels checked more frequently.Ongoing high lipid and cholesterol levels should be treated with medicines if diet and exercise are not working.  If you smoke, find out from your health care provider how to quit. If you do not use tobacco, do not start.  Lung cancer screening is recommended for adults aged 45-80 years who are at high risk for developing lung cancer because of a history of smoking. A yearly low-dose CT scan of the lungs is recommended for people who have at least a 30-pack-year history of smoking and are a current smoker or have quit within the past 15 years. A pack year of smoking is smoking an average of 1 pack of cigarettes a day for 1 year (for example: 1 pack a day for 30 years or 2 packs a day for 15 years). Yearly screening should continue until the smoker has stopped smoking for at least 15 years. Yearly screening should be stopped for people who develop a health problem that would prevent them from having lung cancer treatment.  If you are pregnant, do not drink alcohol. If you are  breastfeeding, be very cautious about drinking alcohol. If you are not pregnant and choose to drink alcohol, do not have more than 1 drink per day. One drink is considered to be 12 ounces (355 mL) of beer, 5 ounces (148 mL) of wine, or 1.5 ounces (44 mL) of liquor.  Avoid use of street drugs. Do not share needles with anyone. Ask for help if you need support or instructions about stopping the use of drugs.  High blood pressure causes heart disease and increases the risk  of stroke. Your blood pressure should be checked at least every 1 to 2 years. Ongoing high blood pressure should be treated with medicines if weight loss and exercise do not work.  If you are 55-79 years old, ask your health care provider if you should take aspirin to prevent strokes.  Diabetes screening is done by taking a blood sample to check your blood glucose level after you have not eaten for a certain period of time (fasting). If you are not overweight and you do not have risk factors for diabetes, you should be screened once every 3 years starting at age 45. If you are overweight or obese and you are 40-70 years of age, you should be screened for diabetes every year as part of your cardiovascular risk assessment.  Breast cancer screening is essential preventive care for women. You should practice "breast self-awareness." This means understanding the normal appearance and feel of your breasts and may include breast self-examination. Any changes detected, no matter how small, should be reported to a health care provider. Women in their 20s and 30s should have a clinical breast exam (CBE) by a health care provider as part of a regular health exam every 1 to 3 years. After age 40, women should have a CBE every year. Starting at age 40, women should consider having a mammogram (breast X-ray test) every year. Women who have a family history of breast cancer should talk to their health care provider about genetic screening. Women at a high risk of breast cancer should talk to their health care providers about having an MRI and a mammogram every year.  Breast cancer gene (BRCA)-related cancer risk assessment is recommended for women who have family members with BRCA-related cancers. BRCA-related cancers include breast, ovarian, tubal, and peritoneal cancers. Having family members with these cancers may be associated with an increased risk for harmful changes (mutations) in the breast cancer genes BRCA1 and  BRCA2. Results of the assessment will determine the need for genetic counseling and BRCA1 and BRCA2 testing.  Your health care provider may recommend that you be screened regularly for cancer of the pelvic organs (ovaries, uterus, and vagina). This screening involves a pelvic examination, including checking for microscopic changes to the surface of your cervix (Pap test). You may be encouraged to have this screening done every 3 years, beginning at age 21.  For women ages 30-65, health care providers may recommend pelvic exams and Pap testing every 3 years, or they may recommend the Pap and pelvic exam, combined with testing for human papilloma virus (HPV), every 5 years. Some types of HPV increase your risk of cervical cancer. Testing for HPV may also be done on women of any age with unclear Pap test results.  Other health care providers may not recommend any screening for nonpregnant women who are considered low risk for pelvic cancer and who do not have symptoms. Ask your health care provider if a screening pelvic exam is right for   you.  If you have had past treatment for cervical cancer or a condition that could lead to cancer, you need Pap tests and screening for cancer for at least 20 years after your treatment. If Pap tests have been discontinued, your risk factors (such as having a new sexual partner) need to be reassessed to determine if screening should resume. Some women have medical problems that increase the chance of getting cervical cancer. In these cases, your health care provider may recommend more frequent screening and Pap tests.  Colorectal cancer can be detected and often prevented. Most routine colorectal cancer screening begins at the age of 50 years and continues through age 75 years. However, your health care provider may recommend screening at an earlier age if you have risk factors for colon cancer. On a yearly basis, your health care provider may provide home test kits to check  for hidden blood in the stool. Use of a small camera at the end of a tube, to directly examine the colon (sigmoidoscopy or colonoscopy), can detect the earliest forms of colorectal cancer. Talk to your health care provider about this at age 50, when routine screening begins. Direct exam of the colon should be repeated every 5-10 years through age 75 years, unless early forms of precancerous polyps or small growths are found.  People who are at an increased risk for hepatitis B should be screened for this virus. You are considered at high risk for hepatitis B if:  You were born in a country where hepatitis B occurs often. Talk with your health care provider about which countries are considered high risk.  Your parents were born in a high-risk country and you have not received a shot to protect against hepatitis B (hepatitis B vaccine).  You have HIV or AIDS.  You use needles to inject street drugs.  You live with, or have sex with, someone who has hepatitis B.  You get hemodialysis treatment.  You take certain medicines for conditions like cancer, organ transplantation, and autoimmune conditions.  Hepatitis C blood testing is recommended for all people born from 1945 through 1965 and any individual with known risks for hepatitis C.  Practice safe sex. Use condoms and avoid high-risk sexual practices to reduce the spread of sexually transmitted infections (STIs). STIs include gonorrhea, chlamydia, syphilis, trichomonas, herpes, HPV, and human immunodeficiency virus (HIV). Herpes, HIV, and HPV are viral illnesses that have no cure. They can result in disability, cancer, and death.  You should be screened for sexually transmitted illnesses (STIs) including gonorrhea and chlamydia if:  You are sexually active and are younger than 24 years.  You are older than 24 years and your health care provider tells you that you are at risk for this type of infection.  Your sexual activity has changed  since you were last screened and you are at an increased risk for chlamydia or gonorrhea. Ask your health care provider if you are at risk.  If you are at risk of being infected with HIV, it is recommended that you take a prescription medicine daily to prevent HIV infection. This is called preexposure prophylaxis (PrEP). You are considered at risk if:  You are sexually active and do not regularly use condoms or know the HIV status of your partner(s).  You take drugs by injection.  You are sexually active with a partner who has HIV.  Talk with your health care provider about whether you are at high risk of being infected with HIV. If   you choose to begin PrEP, you should first be tested for HIV. You should then be tested every 3 months for as long as you are taking PrEP.  Osteoporosis is a disease in which the bones lose minerals and strength with aging. This can result in serious bone fractures or breaks. The risk of osteoporosis can be identified using a bone density scan. Women ages 67 years and over and women at risk for fractures or osteoporosis should discuss screening with their health care providers. Ask your health care provider whether you should take a calcium supplement or vitamin D to reduce the rate of osteoporosis.  Menopause can be associated with physical symptoms and risks. Hormone replacement therapy is available to decrease symptoms and risks. You should talk to your health care provider about whether hormone replacement therapy is right for you.  Use sunscreen. Apply sunscreen liberally and repeatedly throughout the day. You should seek shade when your shadow is shorter than you. Protect yourself by wearing long sleeves, pants, a wide-brimmed hat, and sunglasses year round, whenever you are outdoors.  Once a month, do a whole body skin exam, using a mirror to look at the skin on your back. Tell your health care provider of new moles, moles that have irregular borders, moles that  are larger than a pencil eraser, or moles that have changed in shape or color.  Stay current with required vaccines (immunizations).  Influenza vaccine. All adults should be immunized every year.  Tetanus, diphtheria, and acellular pertussis (Td, Tdap) vaccine. Pregnant women should receive 1 dose of Tdap vaccine during each pregnancy. The dose should be obtained regardless of the length of time since the last dose. Immunization is preferred during the 27th-36th week of gestation. An adult who has not previously received Tdap or who does not know her vaccine status should receive 1 dose of Tdap. This initial dose should be followed by tetanus and diphtheria toxoids (Td) booster doses every 10 years. Adults with an unknown or incomplete history of completing a 3-dose immunization series with Td-containing vaccines should begin or complete a primary immunization series including a Tdap dose. Adults should receive a Td booster every 10 years.  Varicella vaccine. An adult without evidence of immunity to varicella should receive 2 doses or a second dose if she has previously received 1 dose. Pregnant females who do not have evidence of immunity should receive the first dose after pregnancy. This first dose should be obtained before leaving the health care facility. The second dose should be obtained 4-8 weeks after the first dose.  Human papillomavirus (HPV) vaccine. Females aged 13-26 years who have not received the vaccine previously should obtain the 3-dose series. The vaccine is not recommended for use in pregnant females. However, pregnancy testing is not needed before receiving a dose. If a female is found to be pregnant after receiving a dose, no treatment is needed. In that case, the remaining doses should be delayed until after the pregnancy. Immunization is recommended for any person with an immunocompromised condition through the age of 61 years if she did not get any or all doses earlier. During the  3-dose series, the second dose should be obtained 4-8 weeks after the first dose. The third dose should be obtained 24 weeks after the first dose and 16 weeks after the second dose.  Zoster vaccine. One dose is recommended for adults aged 30 years or older unless certain conditions are present.  Measles, mumps, and rubella (MMR) vaccine. Adults born  before 1957 generally are considered immune to measles and mumps. Adults born in 1957 or later should have 1 or more doses of MMR vaccine unless there is a contraindication to the vaccine or there is laboratory evidence of immunity to each of the three diseases. A routine second dose of MMR vaccine should be obtained at least 28 days after the first dose for students attending postsecondary schools, health care workers, or international travelers. People who received inactivated measles vaccine or an unknown type of measles vaccine during 1963-1967 should receive 2 doses of MMR vaccine. People who received inactivated mumps vaccine or an unknown type of mumps vaccine before 1979 and are at high risk for mumps infection should consider immunization with 2 doses of MMR vaccine. For females of childbearing age, rubella immunity should be determined. If there is no evidence of immunity, females who are not pregnant should be vaccinated. If there is no evidence of immunity, females who are pregnant should delay immunization until after pregnancy. Unvaccinated health care workers born before 1957 who lack laboratory evidence of measles, mumps, or rubella immunity or laboratory confirmation of disease should consider measles and mumps immunization with 2 doses of MMR vaccine or rubella immunization with 1 dose of MMR vaccine.  Pneumococcal 13-valent conjugate (PCV13) vaccine. When indicated, a person who is uncertain of his immunization history and has no record of immunization should receive the PCV13 vaccine. All adults 65 years of age and older should receive this  vaccine. An adult aged 19 years or older who has certain medical conditions and has not been previously immunized should receive 1 dose of PCV13 vaccine. This PCV13 should be followed with a dose of pneumococcal polysaccharide (PPSV23) vaccine. Adults who are at high risk for pneumococcal disease should obtain the PPSV23 vaccine at least 8 weeks after the dose of PCV13 vaccine. Adults older than 44 years of age who have normal immune system function should obtain the PPSV23 vaccine dose at least 1 year after the dose of PCV13 vaccine.  Pneumococcal polysaccharide (PPSV23) vaccine. When PCV13 is also indicated, PCV13 should be obtained first. All adults aged 65 years and older should be immunized. An adult younger than age 65 years who has certain medical conditions should be immunized. Any person who resides in a nursing home or long-term care facility should be immunized. An adult smoker should be immunized. People with an immunocompromised condition and certain other conditions should receive both PCV13 and PPSV23 vaccines. People with human immunodeficiency virus (HIV) infection should be immunized as soon as possible after diagnosis. Immunization during chemotherapy or radiation therapy should be avoided. Routine use of PPSV23 vaccine is not recommended for American Indians, [place] Natives, or people younger than 65 years unless there are medical conditions that require PPSV23 vaccine. When indicated, people who have unknown immunization and have no record of immunization should receive PPSV23 vaccine. One-time revaccination 5 years after the first dose of PPSV23 is recommended for people aged 19-64 years who have chronic kidney failure, nephrotic syndrome, asplenia, or immunocompromised conditions. People who received 1-2 doses of PPSV23 before age 65 years should receive another dose of PPSV23 vaccine at age 65 years or later if at least 5 years have passed since the previous dose. Doses of PPSV23 are not  needed for people immunized with PPSV23 at or after age 65 years.  Meningococcal vaccine. Adults with asplenia or persistent complement component deficiencies should receive 2 doses of quadrivalent meningococcal conjugate (MenACWY-D) vaccine. The doses should be obtained   at least 2 months apart. Microbiologists working with certain meningococcal bacteria, [place] recruits, people at risk during an outbreak, and people who travel to or live in countries with a high rate of meningitis should be immunized. A first-year college student up through age 34 years who is living in a residence hall should receive a dose if she did not receive a dose on or after her 16th birthday. Adults who have certain high-risk conditions should receive one or more doses of vaccine.  Hepatitis A vaccine. Adults who wish to be protected from this disease, have certain high-risk conditions, work with hepatitis A-infected animals, work in hepatitis A research labs, or travel to or work in countries with a high rate of hepatitis A should be immunized. Adults who were previously unvaccinated and who anticipate close contact with an international adoptee during the first 60 days after arrival in the [place] from a country with a high rate of hepatitis A should be immunized.  Hepatitis B vaccine. Adults who wish to be protected from this disease, have certain high-risk conditions, may be exposed to blood or other infectious body fluids, are household contacts or sex partners of hepatitis B positive people, are clients or workers in certain care facilities, or travel to or work in countries with a high rate of hepatitis B should be immunized.  Haemophilus influenzae type b (Hib) vaccine. A previously unvaccinated person with asplenia or sickle cell disease or having a scheduled splenectomy should receive 1 dose of Hib vaccine. Regardless of previous immunization, a recipient of a hematopoietic stem cell transplant should receive a  3-dose series 6-12 months after her successful transplant. Hib vaccine is not recommended for adults with HIV infection. Preventive Services / Frequency Ages 35 to 4 years  Blood pressure check.** / Every 3-5 years.  Lipid and cholesterol check.** / Every 5 years beginning at age 60.  Clinical breast exam.** / Every 3 years for women in their 71s and 10s.  BRCA-related cancer risk assessment.** / For women who have family members with a BRCA-related cancer (breast, ovarian, tubal, or peritoneal cancers).  Pap test.** / Every 2 years from ages 76 through 26. Every 3 years starting at age 61 through age 76 or 93 with a history of 3 consecutive normal Pap tests.  HPV screening.** / Every 3 years from ages 37 through ages 60 to 51 with a history of 3 consecutive normal Pap tests.  Hepatitis C blood test.** / For any individual with known risks for hepatitis C.  Skin self-exam. / Monthly.  Influenza vaccine. / Every year.  Tetanus, diphtheria, and acellular pertussis (Tdap, Td) vaccine.** / Consult your health care provider. Pregnant women should receive 1 dose of Tdap vaccine during each pregnancy. 1 dose of Td every 10 years.  Varicella vaccine.** / Consult your health care provider. Pregnant females who do not have evidence of immunity should receive the first dose after pregnancy.  HPV vaccine. / 3 doses over 6 months, if 93 and younger. The vaccine is not recommended for use in pregnant females. However, pregnancy testing is not needed before receiving a dose.  Measles, mumps, rubella (MMR) vaccine.** / You need at least 1 dose of MMR if you were born in 1957 or later. You may also need a 2nd dose. For females of childbearing age, rubella immunity should be determined. If there is no evidence of immunity, females who are not pregnant should be vaccinated. If there is no evidence of immunity, females who are  pregnant should delay immunization until after pregnancy.  Pneumococcal  13-valent conjugate (PCV13) vaccine.** / Consult your health care provider.  Pneumococcal polysaccharide (PPSV23) vaccine.** / 1 to 2 doses if you smoke cigarettes or if you have certain conditions.  Meningococcal vaccine.** / 1 dose if you are age 68 to 8 years and a Market researcher living in a residence hall, or have one of several medical conditions, you need to get vaccinated against meningococcal disease. You may also need additional booster doses.  Hepatitis A vaccine.** / Consult your health care provider.  Hepatitis B vaccine.** / Consult your health care provider.  Haemophilus influenzae type b (Hib) vaccine.** / Consult your health care provider. Ages 7 to 53 years  Blood pressure check.** / Every year.  Lipid and cholesterol check.** / Every 5 years beginning at age 25 years.  Lung cancer screening. / Every year if you are aged 11-80 years and have a 30-pack-year history of smoking and currently smoke or have quit within the past 15 years. Yearly screening is stopped once you have quit smoking for at least 15 years or develop a health problem that would prevent you from having lung cancer treatment.  Clinical breast exam.** / Every year after age 48 years.  BRCA-related cancer risk assessment.** / For women who have family members with a BRCA-related cancer (breast, ovarian, tubal, or peritoneal cancers).  Mammogram.** / Every year beginning at age 41 years and continuing for as long as you are in good health. Consult with your health care provider.  Pap test.** / Every 3 years starting at age 65 years through age 37 or 70 years with a history of 3 consecutive normal Pap tests.  HPV screening.** / Every 3 years from ages 72 years through ages 60 to 40 years with a history of 3 consecutive normal Pap tests.  Fecal occult blood test (FOBT) of stool. / Every year beginning at age 21 years and continuing until age 5 years. You may not need to do this test if you get  a colonoscopy every 10 years.  Flexible sigmoidoscopy or colonoscopy.** / Every 5 years for a flexible sigmoidoscopy or every 10 years for a colonoscopy beginning at age 35 years and continuing until age 48 years.  Hepatitis C blood test.** / For all people born from [birth year] through 1965 and any individual with known risks for hepatitis C.  Skin self-exam. / Monthly.  Influenza vaccine. / Every year.  Tetanus, diphtheria, and acellular pertussis (Tdap/Td) vaccine.** / Consult your health care provider. Pregnant women should receive 1 dose of Tdap vaccine during each pregnancy. 1 dose of Td every 10 years.  Varicella vaccine.** / Consult your health care provider. Pregnant females who do not have evidence of immunity should receive the first dose after pregnancy.  Zoster vaccine.** / 1 dose for adults aged 30 years or older.  Measles, mumps, rubella (MMR) vaccine.** / You need at least 1 dose of MMR if you were born in 1957 or later. You may also need a second dose. For females of childbearing age, rubella immunity should be determined. If there is no evidence of immunity, females who are not pregnant should be vaccinated. If there is no evidence of immunity, females who are pregnant should delay immunization until after pregnancy.  Pneumococcal 13-valent conjugate (PCV13) vaccine.** / Consult your health care provider.  Pneumococcal polysaccharide (PPSV23) vaccine.** / 1 to 2 doses if you smoke cigarettes or if you have certain conditions.  Meningococcal vaccine.** /  Consult your health care provider.  Hepatitis A vaccine.** / Consult your health care provider.  Hepatitis B vaccine.** / Consult your health care provider.  Haemophilus influenzae type b (Hib) vaccine.** / Consult your health care provider. Ages 64 years and over  Blood pressure check.** / Every year.  Lipid and cholesterol check.** / Every 5 years beginning at age 23 years.  Lung cancer screening. / Every year if you  are aged 16-80 years and have a 30-pack-year history of smoking and currently smoke or have quit within the past 15 years. Yearly screening is stopped once you have quit smoking for at least 15 years or develop a health problem that would prevent you from having lung cancer treatment.  Clinical breast exam.** / Every year after age 74 years.  BRCA-related cancer risk assessment.** / For women who have family members with a BRCA-related cancer (breast, ovarian, tubal, or peritoneal cancers).  Mammogram.** / Every year beginning at age 44 years and continuing for as long as you are in good health. Consult with your health care provider.  Pap test.** / Every 3 years starting at age 58 years through age 22 or 39 years with 3 consecutive normal Pap tests. Testing can be stopped between 65 and 70 years with 3 consecutive normal Pap tests and no abnormal Pap or HPV tests in the past 10 years.  HPV screening.** / Every 3 years from ages 64 years through ages 70 or 61 years with a history of 3 consecutive normal Pap tests. Testing can be stopped between 65 and 70 years with 3 consecutive normal Pap tests and no abnormal Pap or HPV tests in the past 10 years.  Fecal occult blood test (FOBT) of stool. / Every year beginning at age 40 years and continuing until age 27 years. You may not need to do this test if you get a colonoscopy every 10 years.  Flexible sigmoidoscopy or colonoscopy.** / Every 5 years for a flexible sigmoidoscopy or every 10 years for a colonoscopy beginning at age 7 years and continuing until age 32 years.  Hepatitis C blood test.** / For all people born from [birth year] through 1965 and any individual with known risks for hepatitis C.  Osteoporosis screening.** / A one-time screening for women ages 30 years and over and women at risk for fractures or osteoporosis.  Skin self-exam. / Monthly.  Influenza vaccine. / Every year.  Tetanus, diphtheria, and acellular pertussis (Tdap/Td)  vaccine.** / 1 dose of Td every 10 years.  Varicella vaccine.** / Consult your health care provider.  Zoster vaccine.** / 1 dose for adults aged 35 years or older.  Pneumococcal 13-valent conjugate (PCV13) vaccine.** / Consult your health care provider.  Pneumococcal polysaccharide (PPSV23) vaccine.** / 1 dose for all adults aged 46 years and older.  Meningococcal vaccine.** / Consult your health care provider.  Hepatitis A vaccine.** / Consult your health care provider.  Hepatitis B vaccine.** / Consult your health care provider.  Haemophilus influenzae type b (Hib) vaccine.** / Consult your health care provider. ** Family history and personal history of risk and conditions may change your health care provider's recommendations.   This information is not intended to replace advice given to you by your health care provider. Make sure you discuss any questions you have with your health care provider.   Document Released: 01/21/2002 Document Revised: 12/16/2014 Document Reviewed: 04/22/2011 Elsevier Interactive Patient Education [employer].

## 2015-11-08 NOTE — Progress Notes (Signed)
Subjective:  Patient ID: Gail Payne, female    DOB: August 26, 1971  Age: 44 y.o. MRN: [id number]  CC: Anemia; Annual Exam; and Osteoarthritis   HPI Gail Payne presents for a CPX - she offers no new complaints.  Outpatient Prescriptions Prior to Visit  Medication Sig Dispense Refill  . Cholecalciferol (VITAMIN D) 2000 UNITS CAPS Take by mouth daily.    . Cyanocobalamin (VITAMIN B-12 IJ) Inject as directed every 30 (thirty) days.    Marland Kitchen LEVORA 0.15/30, 28, 0.15-30 MG-MCG tablet Take 1 tablet by mouth daily.     . mometasone-formoterol (DULERA) 200-5 MCG/ACT AERO Inhale 2 puffs into the lungs 2 (two) times daily. 13 g 11  . montelukast (SINGULAIR) 10 MG tablet Take 1 tablet (10 mg total) by mouth at bedtime. 30 tablet 11  . Omega-3 Fatty Acids (FISH OIL CONCENTRATE PO) Take by mouth.    Marland Kitchen omeprazole (PRILOSEC) 40 MG capsule Take 1 capsule each morning before eating 30 capsule 0  . PROAIR RESPICLICK 123XX123 (90 BASE) MCG/ACT AEPB INHALE 2 PUFFS IN TO LUNGS EVERY 4 HOURS AS NEEDED 1 each 11  . traMADol (ULTRAM) 50 MG tablet TAKE 1 TABLET BY MOUTH EVERY 8 HOURS AS NEEDED 50 tablet 2  . ferrous sulfate 325 (65 FE) MG tablet TAKE 1 TABLET BY MOUTH TWICE DAILY WITH A MEAL 180 tablet 3  . meloxicam (MOBIC) 15 MG tablet Take 15 mg by mouth daily.      No facility-administered medications prior to visit.    ROS Review of Systems  Constitutional: Negative.  Negative for fever, chills, diaphoresis, appetite change and fatigue.  HENT: Negative.   Eyes: Negative.   Respiratory: Negative.  Negative for cough, choking, chest tightness, shortness of breath and stridor.   Cardiovascular: Negative.  Negative for chest pain, palpitations and leg swelling.  Gastrointestinal: Negative.  Negative for nausea, vomiting, abdominal pain, diarrhea, constipation and blood in stool.  Endocrine: Negative.   Genitourinary: Negative.   Musculoskeletal: Positive for arthralgias. Negative for myalgias, back pain,  joint swelling and neck stiffness.  Skin: Negative.  Negative for color change and rash.  Allergic/Immunologic: Negative.   Neurological: Negative.  Negative for dizziness, tremors, weakness and headaches.  Hematological: Negative.  Negative for adenopathy. Does not bruise/bleed easily.  Psychiatric/Behavioral: Negative.     Objective:  BP 118/82 mmHg  Pulse 61  Temp(Src) 98.4 F (36.9 C) (Oral)  Resp 16  Ht 5\' 5"  (1.651 m)  Wt 258 lb (117.028 kg)  BMI 42.93 kg/m2  SpO2 99%  BP Readings from Last 3 Encounters:  11/08/15 118/82  02/24/15 124/70  02/22/15 161/85    Wt Readings from Last 3 Encounters:  11/08/15 258 lb (117.028 kg)  02/24/15 271 lb (122.925 kg)  01/13/15 263 lb 4 oz (119.409 kg)    Physical Exam  Constitutional: She is oriented to person, place, and time. No distress.  HENT:  Mouth/Throat: Oropharynx is clear and moist. No oropharyngeal exudate.  Eyes: Conjunctivae are normal. Right eye exhibits no discharge. Left eye exhibits no discharge. No scleral icterus.  Neck: Normal range of motion. Neck supple. No JVD present. No tracheal deviation present. No thyromegaly present.  Cardiovascular: Normal rate, regular rhythm, normal heart sounds and intact distal pulses.  Exam reveals no gallop and no friction rub.   No murmur heard. Pulmonary/Chest: Effort normal and breath sounds normal. No stridor. No respiratory distress. She has no wheezes. She has no rales. She exhibits no tenderness.  Abdominal: Soft.  Bowel sounds are normal. She exhibits no distension and no mass. There is no tenderness. There is no rebound and no guarding.  Genitourinary:  GU and breast exams were deferred at her request since she sees a GYN annually  Musculoskeletal: Normal range of motion. She exhibits no edema or tenderness.  Lymphadenopathy:    She has no cervical adenopathy.  Neurological: She is oriented to person, place, and time.  Skin: Skin is warm and dry. No rash noted. She is  not diaphoretic. No erythema. No pallor.  Vitals reviewed.   Lab Results  Component Value Date   WBC 12.5* 08/05/2014   HGB 12.4 08/05/2014   HCT 38.2 08/05/2014   PLT 422.0* 08/05/2014   GLUCOSE 108* 08/05/2014   CHOL 157 08/27/2013   TRIG 58.0 08/27/2013   HDL 37.80* 08/27/2013   LDLCALC 108* 08/27/2013   ALT 17 08/27/2013   AST 17 08/27/2013   NA 137 08/05/2014   K 3.8 08/05/2014   CL 105 08/05/2014   CREATININE 0.7 08/05/2014   BUN 14 08/05/2014   CO2 24 08/05/2014   TSH 0.46 08/27/2013   HGBA1C 5.8 08/27/2013    Dg Chest 2 View  01/13/2015  CLINICAL DATA:  Cough, shortness of breath for 2 months. EXAM: CHEST  2 VIEW COMPARISON:  CT chest 08/09/2014 FINDINGS: Mild lingular scarring. There is no focal parenchymal opacity, pleural effusion, or pneumothorax. The heart and mediastinal contours are unremarkable. The osseous structures are unremarkable. IMPRESSION: No active cardiopulmonary disease. Electronically Signed   By: Kathreen Devoid   On: 01/13/2015 10:44    Assessment & Plan:   Gail Payne was seen today for anemia, annual exam and osteoarthritis.  Diagnoses and all orders for this visit:  Encounter for immunization  Other abnormal glucose -     Hemoglobin A1c; Future  Routine general medical examination at a health care facility- her vaccines were reviewed and updated, exam done, labs ordered and reviewed, Mammo will be done in the next week by her GYN, PAP is UTD, pt ed material was given -     Lipid panel; Future -     Comprehensive metabolic panel; Future -     CBC with Differential/Platelet; Future -     TSH; Future  B12 deficiency anemia -     cyanocobalamin ((VITAMIN B-12)) injection 1,000 mcg; Inject 1 mL (1,000 mcg total) into the muscle once.  Primary osteoarthritis of both knees -     meloxicam (MOBIC) 15 MG tablet; Take 1 tablet (15 mg total) by mouth daily.  Other orders -     ferrous sulfate 325 (65 FE) MG tablet; TAKE 1 TABLET BY MOUTH TWICE DAILY  WITH A MEAL -     Flu Vaccine QUAD 36+ mos IM   I have changed Gail Payne's meloxicam. I am also having her maintain her Cyanocobalamin (VITAMIN B-12 IJ), LEVORA 0.15/30 (28), mometasone-formoterol, montelukast, Omega-3 Fatty Acids (FISH OIL CONCENTRATE PO), Vitamin D, omeprazole, PROAIR RESPICLICK, traMADol, and ferrous sulfate. We administered cyanocobalamin.  Meds ordered this encounter  Medications  . ferrous sulfate 325 (65 FE) MG tablet    Sig: TAKE 1 TABLET BY MOUTH TWICE DAILY WITH A MEAL    Dispense:  180 tablet    Refill:  3  . cyanocobalamin ((VITAMIN B-12)) injection 1,000 mcg    Sig:   . meloxicam (MOBIC) 15 MG tablet    Sig: Take 1 tablet (15 mg total) by mouth daily.    Dispense:  90 tablet  Refill:  3     Follow-up: Return if symptoms worsen or fail to improve.  Scarlette Calico, MD

## 2015-11-10 ENCOUNTER — Other Ambulatory Visit: Payer: Self-pay | Admitting: Internal Medicine

## 2015-11-13 ENCOUNTER — Other Ambulatory Visit: Payer: Self-pay | Admitting: Internal Medicine

## 2015-11-23 LAB — HM PAP SMEAR

## 2015-11-24 LAB — HM MAMMOGRAPHY

## 2015-12-14 ENCOUNTER — Ambulatory Visit: Payer: BLUE CROSS/BLUE SHIELD | Admitting: Internal Medicine

## 2016-01-07 NOTE — Addendum Note (Signed)
Addended by: [name] on: 01/07/2016 11:30 AM   Modules accepted: Miquel Dunn

## 2016-01-07 NOTE — Addendum Note (Signed)
Addended by: [name] on: 01/07/2016 11:31 AM   Modules accepted: Miquel Dunn

## 2016-04-01 ENCOUNTER — Encounter: Payer: Self-pay | Admitting: Internal Medicine

## 2016-04-02 ENCOUNTER — Other Ambulatory Visit: Payer: Self-pay | Admitting: Internal Medicine

## 2016-04-02 DIAGNOSIS — R300 Dysuria: Secondary | ICD-10-CM

## 2016-05-21 ENCOUNTER — Other Ambulatory Visit: Payer: Self-pay | Admitting: Internal Medicine

## 2016-05-21 NOTE — Telephone Encounter (Signed)
Faxed script back to [facility].../lmb 

## 2016-06-05 ENCOUNTER — Ambulatory Visit (INDEPENDENT_AMBULATORY_CARE_PROVIDER_SITE_OTHER): Payer: BLUE CROSS/BLUE SHIELD | Admitting: Internal Medicine

## 2016-06-05 ENCOUNTER — Encounter: Payer: Self-pay | Admitting: Internal Medicine

## 2016-06-05 VITALS — BP 128/87 | HR 65 | Temp 99.1°F | Resp 16 | Ht 65.0 in | Wt 234.0 lb

## 2016-06-05 DIAGNOSIS — D519 Vitamin B12 deficiency anemia, unspecified: Secondary | ICD-10-CM | POA: Diagnosis not present

## 2016-06-05 DIAGNOSIS — K2100 Gastro-esophageal reflux disease with esophagitis, without bleeding: Secondary | ICD-10-CM

## 2016-06-05 DIAGNOSIS — R1314 Dysphagia, pharyngoesophageal phase: Secondary | ICD-10-CM | POA: Diagnosis not present

## 2016-06-05 MED ORDER — CYANOCOBALAMIN 1000 MCG/ML IJ SOLN
1000.0000 ug | Freq: Once | INTRAMUSCULAR | Status: AC
  Administered 2016-06-05: 1000 ug via INTRAMUSCULAR

## 2016-06-05 MED ORDER — DEXLANSOPRAZOLE 60 MG PO CPDR: 60.0000 mg | DELAYED_RELEASE_CAPSULE | Freq: Every day | ORAL | Status: DC

## 2016-06-05 NOTE — Patient Instructions (Signed)

## 2016-06-05 NOTE — Progress Notes (Signed)
Pre visit review using our clinic review tool, if applicable. No additional management support is needed unless otherwise documented below in the visit note. 

## 2016-06-05 NOTE — Progress Notes (Signed)
Subjective:  Patient ID: Gail Payne, female    DOB: 08-13-1971  Age: 45 y.o. MRN: [id number]  CC: Gastroesophageal Reflux   HPI THIEN MAGID presents for the complaint of a 10 day history of difficulty swallowing and painful swallowing. She takes a proton pump inhibitor for presumed GERD though she doesn't experience heartburn. She was given a proton pump inhibitor by her lung doctor because they thought it was contributing to her asthma. She describes the pain. Swallowing as a sharp pain whenever she tries to swallow food. The trouble with swallowing has been intermittent and has not caused choking or vomiting.  Outpatient Prescriptions Prior to Visit  Medication Sig Dispense Refill  . Cholecalciferol (VITAMIN D) 2000 UNITS CAPS Take by mouth daily.    . Cyanocobalamin (VITAMIN B-12 IJ) Inject as directed every 30 (thirty) days.    . ferrous sulfate 325 (65 FE) MG tablet TAKE 1 TABLET BY MOUTH TWICE DAILY WITH A MEAL 180 tablet 3  . LEVORA 0.15/30, 28, 0.15-30 MG-MCG tablet Take 1 tablet by mouth daily.     . meloxicam (MOBIC) 15 MG tablet Take 1 tablet (15 mg total) by mouth daily. 90 tablet 3  . mometasone-formoterol (DULERA) 200-5 MCG/ACT AERO Inhale 2 puffs into the lungs 2 (two) times daily. 13 g 11  . montelukast (SINGULAIR) 10 MG tablet TAKE 1 TABLET BY MOUTH AT BEDTIME. 30 tablet 11  . Omega-3 Fatty Acids (FISH OIL CONCENTRATE PO) Take by mouth.    Marland Kitchen PROAIR RESPICLICK 123XX123 (90 BASE) MCG/ACT AEPB INHALE 2 PUFFS IN TO LUNGS EVERY 4 HOURS AS NEEDED 1 each 11  . traMADol (ULTRAM) 50 MG tablet TAKE 1 TABLET BY MOUTH EVERY 8 HOURS AS NEEDED 50 tablet 3  . omeprazole (PRILOSEC) 40 MG capsule Take 1 capsule each morning before eating 30 capsule 0   No facility-administered medications prior to visit.    ROS Review of Systems  Constitutional: Negative.  Negative for fever, chills, diaphoresis, appetite change and fatigue.  HENT: Positive for trouble swallowing. Negative for  congestion, sinus pressure, sore throat and voice change.   Eyes: Negative.   Respiratory: Negative.  Negative for cough, choking, chest tightness, shortness of breath and stridor.   Cardiovascular: Negative.  Negative for chest pain, palpitations and leg swelling.  Gastrointestinal: Negative.  Negative for nausea, vomiting, abdominal pain, diarrhea and constipation.  Endocrine: Negative.   Genitourinary: Negative.   Musculoskeletal: Negative.  Negative for neck pain.  Skin: Negative.  Negative for color change and rash.  Allergic/Immunologic: Negative.   Neurological: Negative.  Negative for dizziness, tremors, weakness, light-headedness and numbness.  Hematological: Negative.  Negative for adenopathy. Does not bruise/bleed easily.  Psychiatric/Behavioral: Negative.     Objective:  BP 128/87 mmHg  Pulse 65  Temp(Src) 99.1 F (37.3 C) (Oral)  Resp 16  Ht 5\' 5"  (1.651 m)  Wt 234 lb (106.142 kg)  BMI 38.94 kg/m2  SpO2 98%  BP Readings from Last 3 Encounters:  06/05/16 128/87  11/08/15 118/82  02/24/15 124/70    Wt Readings from Last 3 Encounters:  06/05/16 234 lb (106.142 kg)  11/08/15 258 lb (117.028 kg)  02/24/15 271 lb (122.925 kg)    Physical Exam  Constitutional: She is oriented to person, place, and time. No distress.  HENT:  Mouth/Throat: Oropharynx is clear and moist and mucous membranes are normal. Mucous membranes are not pale, not dry and not cyanotic. No oral lesions. No trismus in the jaw. No uvula swelling.  No oropharyngeal exudate, posterior oropharyngeal edema, posterior oropharyngeal erythema or tonsillar abscesses.  Eyes: Conjunctivae are normal. Right eye exhibits no discharge. Left eye exhibits no discharge. No scleral icterus.  Neck: Normal range of motion. Neck supple. No JVD present. No tracheal deviation present. No thyromegaly present.  Cardiovascular: Normal rate, regular rhythm, normal heart sounds and intact distal pulses.  Exam reveals no gallop  and no friction rub.   No murmur heard. Pulmonary/Chest: Effort normal and breath sounds normal. No stridor. No respiratory distress. She has no wheezes. She has no rales. She exhibits no tenderness.  Abdominal: Soft. Bowel sounds are normal. She exhibits no distension and no mass. There is no tenderness. There is no rebound and no guarding.  Musculoskeletal: Normal range of motion. She exhibits no edema or tenderness.  Lymphadenopathy:    She has no cervical adenopathy.  Neurological: She is oriented to person, place, and time.  Skin: Skin is warm and dry. No rash noted. She is not diaphoretic. No erythema. No pallor.  Vitals reviewed.   Lab Results  Component Value Date   WBC 10.6* 11/08/2015   HGB 12.0 11/08/2015   HCT 36.9 11/08/2015   PLT 375.0 11/08/2015   GLUCOSE 104* 11/08/2015   CHOL 148 11/08/2015   TRIG 74.0 11/08/2015   HDL 33.50* 11/08/2015   LDLCALC 100* 11/08/2015   ALT 19 11/08/2015   AST 16 11/08/2015   NA 139 11/08/2015   K 3.8 11/08/2015   CL 104 11/08/2015   CREATININE 0.67 11/08/2015   BUN 15 11/08/2015   CO2 28 11/08/2015   TSH 1.12 11/08/2015   HGBA1C 5.6 11/08/2015    Dg Chest 2 View  01/13/2015  CLINICAL DATA:  Cough, shortness of breath for 2 months. EXAM: CHEST  2 VIEW COMPARISON:  CT chest 08/09/2014 FINDINGS: Mild lingular scarring. There is no focal parenchymal opacity, pleural effusion, or pneumothorax. The heart and mediastinal contours are unremarkable. The osseous structures are unremarkable. IMPRESSION: No active cardiopulmonary disease. Electronically Signed   By: Kathreen Devoid   On: 01/13/2015 10:44    Assessment & Plan:   Cressa was seen today for gastroesophageal reflux.  Diagnoses and all orders for this visit:  Dysphagia, pharyngoesophageal phase- will order an upper GI series to see if there is a Zenker's diverticulum, stricture, mass, or some other form of upper GI pathology. -     DG Esophagus; Future  Gastroesophageal reflux  disease with esophagitis- she does not complain of heartburn but her symptoms are concerning for complications from GERD, I have asked her to change to a more potent proton pump inhibitor. -     dexlansoprazole (DEXILANT) 60 MG capsule; Take 1 capsule (60 mg total) by mouth daily.  B12 deficiency anemia -     cyanocobalamin ((VITAMIN B-12)) injection 1,000 mcg; Inject 1 mL (1,000 mcg total) into the muscle once.   I have discontinued Ms. Riedlinger's omeprazole. I am also having her start on dexlansoprazole. Additionally, I am having her maintain her Cyanocobalamin (VITAMIN B-12 IJ), LEVORA 0.15/30 (28), mometasone-formoterol, Omega-3 Fatty Acids (FISH OIL CONCENTRATE PO), Vitamin D, PROAIR RESPICLICK, ferrous sulfate, meloxicam, montelukast, and traMADol. We administered cyanocobalamin.  Meds ordered this encounter  Medications  . dexlansoprazole (DEXILANT) 60 MG capsule    Sig: Take 1 capsule (60 mg total) by mouth daily.    Dispense:  30 capsule    Refill:  5  . cyanocobalamin ((VITAMIN B-12)) injection 1,000 mcg    Sig:  Follow-up: Return in about 3 weeks (around 06/26/2016).  Scarlette Calico, MD

## 2016-06-26 ENCOUNTER — Ambulatory Visit
Admission: RE | Admit: 2016-06-26 | Discharge: 2016-06-26 | Disposition: A | Discharge status: Home / Self Care | Payer: BLUE CROSS/BLUE SHIELD | Source: Ambulatory Visit | Attending: Internal Medicine | Admitting: Internal Medicine

## 2016-06-26 ENCOUNTER — Encounter: Payer: Self-pay | Admitting: Internal Medicine

## 2016-06-26 DIAGNOSIS — R1314 Dysphagia, pharyngoesophageal phase: Secondary | ICD-10-CM

## 2016-06-27 ENCOUNTER — Ambulatory Visit (INDEPENDENT_AMBULATORY_CARE_PROVIDER_SITE_OTHER): Payer: BLUE CROSS/BLUE SHIELD | Admitting: Internal Medicine

## 2016-06-27 ENCOUNTER — Encounter: Payer: Self-pay | Admitting: Internal Medicine

## 2016-06-27 ENCOUNTER — Other Ambulatory Visit (INDEPENDENT_AMBULATORY_CARE_PROVIDER_SITE_OTHER): Payer: BLUE CROSS/BLUE SHIELD

## 2016-06-27 VITALS — BP 128/60 | HR 70 | Temp 98.4°F | Resp 16 | Ht 65.0 in | Wt 235.0 lb

## 2016-06-27 DIAGNOSIS — N3281 Overactive bladder: Secondary | ICD-10-CM

## 2016-06-27 DIAGNOSIS — K2100 Gastro-esophageal reflux disease with esophagitis, without bleeding: Secondary | ICD-10-CM

## 2016-06-27 DIAGNOSIS — R350 Frequency of micturition: Secondary | ICD-10-CM | POA: Insufficient documentation

## 2016-06-27 LAB — URINALYSIS, ROUTINE W REFLEX MICROSCOPIC
Bilirubin Urine: NEGATIVE
Ketones, ur: NEGATIVE
Leukocytes, UA: NEGATIVE
Nitrite: NEGATIVE
Specific Gravity, Urine: 1.02 (ref 1.000–1.030)
Total Protein, Urine: NEGATIVE
Urine Glucose: NEGATIVE
Urobilinogen, UA: 1 (ref 0.0–1.0)
pH: 7 (ref 5.0–8.0)

## 2016-06-27 NOTE — Progress Notes (Signed)
Subjective:  Patient ID: Gail Payne, female    DOB: [birth year]  Age: 45 y.o. MRN: [id number]  CC: Gastroesophageal Reflux   HPI Gail Payne presents for f/up on GERD. She has been taking Dexialnt for a month and feels much better. She is having no odynophagia or dysphagia. She recently underwent a barium swallow that showed mild GERD. She denies any recent episodes of cough, wheezing, hemoptysis, or chest pain.  She also complains of a several week history of urinary frequency and urinary urgency. She states goes urinates as frequently as every 1-2 hours. She denies dysuria or hematuria.  Outpatient Prescriptions Prior to Visit  Medication Sig Dispense Refill  . Cholecalciferol (VITAMIN D) 2000 UNITS CAPS Take by mouth daily.    . Cyanocobalamin (VITAMIN B-12 IJ) Inject as directed every 30 (thirty) days.    Marland Kitchen dexlansoprazole (DEXILANT) 60 MG capsule Take 1 capsule (60 mg total) by mouth daily. 30 capsule 5  . ferrous sulfate 325 (65 FE) MG tablet TAKE 1 TABLET BY MOUTH TWICE DAILY WITH A MEAL 180 tablet 3  . LEVORA 0.15/30, 28, 0.15-30 MG-MCG tablet Take 1 tablet by mouth daily.     . meloxicam (MOBIC) 15 MG tablet Take 1 tablet (15 mg total) by mouth daily. 90 tablet 3  . mometasone-formoterol (DULERA) 200-5 MCG/ACT AERO Inhale 2 puffs into the lungs 2 (two) times daily. 13 g 11  . montelukast (SINGULAIR) 10 MG tablet TAKE 1 TABLET BY MOUTH AT BEDTIME. 30 tablet 11  . Omega-3 Fatty Acids (FISH OIL CONCENTRATE PO) Take by mouth.    Marland Kitchen PROAIR RESPICLICK [id number] (90 BASE) MCG/ACT AEPB INHALE 2 PUFFS IN TO LUNGS EVERY 4 HOURS AS NEEDED 1 each 11  . traMADol (ULTRAM) 50 MG tablet TAKE 1 TABLET BY MOUTH EVERY 8 HOURS AS NEEDED 50 tablet 3   No facility-administered medications prior to visit.    ROS Review of Systems  Constitutional: Negative.  Negative for chills, appetite change and fatigue.  HENT: Negative.  Negative for congestion, sinus pressure, sore throat, trouble swallowing  and voice change.   Eyes: Negative.  Negative for visual disturbance.  Respiratory: Negative.  Negative for cough, choking, chest tightness, shortness of breath and stridor.   Cardiovascular: Negative.  Negative for chest pain, palpitations and leg swelling.  Gastrointestinal: Negative.  Negative for nausea, vomiting, abdominal pain, diarrhea and constipation.  Endocrine: Negative.   Genitourinary: Positive for dysuria and frequency. Negative for urgency, hematuria, flank pain, decreased urine volume, enuresis, difficulty urinating and pelvic pain.  Musculoskeletal: Negative.  Negative for back pain and neck pain.  Skin: Negative.  Negative for color change and rash.  Allergic/Immunologic: Negative.   Neurological: Negative.   Hematological: Negative.  Negative for adenopathy. Does not bruise/bleed easily.  Psychiatric/Behavioral: Negative.     Objective:  BP 128/60 mmHg  Pulse 70  Temp(Src) 98.4 F (36.9 C) (Oral)  Resp 16  Ht 5\' 5"  (1.651 m)  Wt 235 lb (106.595 kg)  BMI 39.11 kg/m2  SpO2 96%  BP Readings from Last 3 Encounters:  06/27/16 128/60  06/05/16 128/87  11/08/15 118/82    Wt Readings from Last 3 Encounters:  06/27/16 235 lb (106.595 kg)  06/05/16 234 lb (106.142 kg)  11/08/15 258 lb (117.028 kg)    Physical Exam  Constitutional: She is oriented to person, place, and time. No distress.  HENT:  Mouth/Throat: Oropharynx is clear and moist. No oropharyngeal exudate.  Eyes: Conjunctivae are normal. Right eye exhibits  no discharge. Left eye exhibits no discharge. No scleral icterus.  Neck: Normal range of motion. Neck supple. No JVD present. No tracheal deviation present. No thyromegaly present.  Cardiovascular: Normal rate, regular rhythm, normal heart sounds and intact distal pulses.  Exam reveals no gallop and no friction rub.   No murmur heard. Pulmonary/Chest: Effort normal and breath sounds normal. No stridor. No respiratory distress. She has no wheezes. She  has no rales. She exhibits no tenderness.  Abdominal: Soft. Bowel sounds are normal. She exhibits no distension and no mass. There is no tenderness. There is no rebound and no guarding.  Musculoskeletal: Normal range of motion. She exhibits no edema or tenderness.  Lymphadenopathy:    She has no cervical adenopathy.  Neurological: She is oriented to person, place, and time.  Skin: Skin is warm and dry. No rash noted. She is not diaphoretic. No erythema. No pallor.  Vitals reviewed.   Lab Results  Component Value Date   WBC 10.6* 11/08/2015   HGB 12.0 11/08/2015   HCT 36.9 11/08/2015   PLT 375.0 11/08/2015   GLUCOSE 104* 11/08/2015   CHOL 148 11/08/2015   TRIG 74.0 11/08/2015   HDL 33.50* 11/08/2015   LDLCALC 100* 11/08/2015   ALT 19 11/08/2015   AST 16 11/08/2015   NA 139 11/08/2015   K 3.8 11/08/2015   CL 104 11/08/2015   CREATININE 0.67 11/08/2015   BUN 15 11/08/2015   CO2 28 11/08/2015   TSH 1.12 11/08/2015   HGBA1C 5.6 11/08/2015    Dg Esophagus  06/26/2016  CLINICAL DATA:  Difficulty swallowing EXAM: ESOPHOGRAM / BARIUM SWALLOW / BARIUM TABLET STUDY TECHNIQUE: Combined double contrast and single contrast examination performed using effervescent crystals, thick barium liquid, and thin barium liquid. The patient was observed with fluoroscopy swallowing a 13 mm barium sulphate tablet. FLUOROSCOPY TIME:  Radiation Exposure Index (as provided by the fluoroscopic device): 66 deciGy per square cm If the device does not provide the exposure index: Fluoroscopy Time:  1 minutes 30 seconds Number of Acquired Images: FINDINGS: A double-contrast barium swallow was performed. The mucosa of the esophagus is unremarkable. A single contrast study shows the swallowing mechanism to be normal. Esophageal peristalsis is normal. No hiatal hernia is seen. Only mild gastroesophageal reflux is noted. A barium pill was given at the end of the study which passed into the stomach without delay.  IMPRESSION: 1. Mild gastroesophageal reflux. Barium pill passes into the stomach without delay. 2. No hiatal hernia. Electronically Signed   By: Ivar Drape M.D.   On: 06/26/2016 11:45    Assessment & Plan:   Toscha was seen today for gastroesophageal reflux.  Diagnoses and all orders for this visit:  Gastroesophageal reflux disease with esophagitis- marked improvement noted on the PPI and the recent barium swallow was negative for complications  Urine frequency- her urinalysis and urine culture are negative for blood or infection, will treat for OAB. -     Urinalysis, Routine w reflex microscopic (not at [facility]); Future -     CULTURE, URINE COMPREHENSIVE; Future  OAB (overactive bladder) -     mirabegron ER (MYRBETRIQ) 50 MG TB24 tablet; Take 1 tablet (50 mg total) by mouth daily.   I am having Ms. Lucci start on mirabegron ER. I am also having her maintain her Cyanocobalamin (VITAMIN B-12 IJ), LEVORA 0.15/30 (28), mometasone-formoterol, Omega-3 Fatty Acids (FISH OIL CONCENTRATE PO), Vitamin D, PROAIR RESPICLICK, ferrous sulfate, meloxicam, montelukast, traMADol, and dexlansoprazole.  Meds ordered this  encounter  Medications  . mirabegron ER (MYRBETRIQ) 50 MG TB24 tablet    Sig: Take 1 tablet (50 mg total) by mouth daily.    Dispense:  90 tablet    Refill:  3     Follow-up: Return if symptoms worsen or fail to improve.  Scarlette Calico, MD

## 2016-06-27 NOTE — Patient Instructions (Signed)

## 2016-06-27 NOTE — Progress Notes (Signed)
Pre visit review using our clinic review tool, if applicable. No additional management support is needed unless otherwise documented below in the visit note. 

## 2016-06-29 ENCOUNTER — Encounter: Payer: Self-pay | Admitting: Internal Medicine

## 2016-06-29 DIAGNOSIS — N3281 Overactive bladder: Secondary | ICD-10-CM | POA: Insufficient documentation

## 2016-06-29 MED ORDER — MIRABEGRON ER 50 MG PO TB24: 50.0000 mg | ORAL_TABLET | Freq: Every day | ORAL | Status: DC

## 2016-06-30 LAB — CULTURE, URINE COMPREHENSIVE

## 2016-07-01 ENCOUNTER — Other Ambulatory Visit: Payer: Self-pay | Admitting: Internal Medicine

## 2016-07-01 ENCOUNTER — Encounter: Payer: Self-pay | Admitting: Internal Medicine

## 2016-07-01 DIAGNOSIS — N390 Urinary tract infection, site not specified: Secondary | ICD-10-CM

## 2016-07-01 DIAGNOSIS — B952 Enterococcus as the cause of diseases classified elsewhere: Secondary | ICD-10-CM | POA: Insufficient documentation

## 2016-07-01 MED ORDER — AMPICILLIN 500 MG PO CAPS: 500.0000 mg | ORAL_CAPSULE | Freq: Three times a day (TID) | ORAL | 0 refills | Status: AC

## 2016-09-25 ENCOUNTER — Ambulatory Visit: Payer: BLUE CROSS/BLUE SHIELD

## 2016-10-04 ENCOUNTER — Telehealth: Payer: Self-pay | Admitting: Internal Medicine

## 2016-10-08 ENCOUNTER — Other Ambulatory Visit: Payer: Self-pay | Admitting: Internal Medicine

## 2016-10-08 ENCOUNTER — Telehealth: Payer: Self-pay | Admitting: *Deleted

## 2016-10-08 DIAGNOSIS — K219 Gastro-esophageal reflux disease without esophagitis: Secondary | ICD-10-CM

## 2016-10-08 MED ORDER — OMEPRAZOLE 40 MG PO CPDR: 40.0000 mg | DELAYED_RELEASE_CAPSULE | Freq: Every day | ORAL | 3 refills | Status: DC

## 2016-10-08 NOTE — Telephone Encounter (Signed)
Notified pt w/MD response.../lmb 

## 2016-10-08 NOTE — Telephone Encounter (Signed)
changed

## 2016-10-08 NOTE — Telephone Encounter (Signed)
Left msg on triage stating she would like MD to change her Dexilant to something else less expensive...Johny Chess

## 2016-10-21 ENCOUNTER — Other Ambulatory Visit: Payer: Self-pay | Admitting: Internal Medicine

## 2016-10-21 DIAGNOSIS — J4530 Mild persistent asthma, uncomplicated: Secondary | ICD-10-CM

## 2016-11-04 ENCOUNTER — Telehealth: Payer: Self-pay | Admitting: *Deleted

## 2016-11-04 NOTE — Telephone Encounter (Signed)
Her insurance co needs to tell me which inhaler like this is her best option

## 2016-11-04 NOTE — Telephone Encounter (Signed)
Pt left msg on triage stating the [facility] copay has went up to $70, and no longer can afford. Requesting MD to change to cheaper alternative...Gail Payne

## 2016-11-05 NOTE — Telephone Encounter (Signed)
Tried calling pt no answer can't leave msg due to vm not set-up.../lmb 

## 2016-11-06 NOTE — Telephone Encounter (Signed)
Called pt per mom was at work gave mother MD response...Gail Payne

## 2016-11-06 NOTE — Telephone Encounter (Signed)
Pt informed that we have a copay card ready for to pick up.   Pt stated she would come in today and pick up.

## 2016-11-08 NOTE — Telephone Encounter (Signed)
Patient called asking if we could mail copay card to her. Have been unable to locate the copay card referred to in this message. Please mail to the patient.

## 2016-11-12 ENCOUNTER — Other Ambulatory Visit: Payer: Self-pay | Admitting: Internal Medicine

## 2016-11-13 NOTE — Telephone Encounter (Signed)
This has been mailed.

## 2016-11-13 NOTE — Telephone Encounter (Signed)
Pt informed savings card has been mailed.

## 2016-11-21 ENCOUNTER — Ambulatory Visit (INDEPENDENT_AMBULATORY_CARE_PROVIDER_SITE_OTHER): Payer: BLUE CROSS/BLUE SHIELD | Admitting: General Practice

## 2016-11-21 DIAGNOSIS — E538 Deficiency of other specified B group vitamins: Secondary | ICD-10-CM

## 2016-11-21 MED ORDER — CYANOCOBALAMIN 1000 MCG/ML IJ SOLN
1000.0000 ug | Freq: Once | INTRAMUSCULAR | Status: AC
  Administered 2016-11-21: 1000 ug via INTRAMUSCULAR

## 2016-11-28 ENCOUNTER — Other Ambulatory Visit: Payer: Self-pay | Admitting: Internal Medicine

## 2016-11-28 DIAGNOSIS — M170 Bilateral primary osteoarthritis of knee: Secondary | ICD-10-CM

## 2017-02-05 ENCOUNTER — Other Ambulatory Visit: Payer: Self-pay | Admitting: Internal Medicine

## 2017-03-06 ENCOUNTER — Other Ambulatory Visit: Payer: Self-pay | Admitting: Internal Medicine

## 2017-03-17 ENCOUNTER — Other Ambulatory Visit (INDEPENDENT_AMBULATORY_CARE_PROVIDER_SITE_OTHER): Payer: BLUE CROSS/BLUE SHIELD

## 2017-03-17 ENCOUNTER — Ambulatory Visit (INDEPENDENT_AMBULATORY_CARE_PROVIDER_SITE_OTHER): Payer: BLUE CROSS/BLUE SHIELD | Admitting: Internal Medicine

## 2017-03-17 ENCOUNTER — Encounter: Payer: Self-pay | Admitting: Internal Medicine

## 2017-03-17 VITALS — BP 138/82 | HR 64 | Temp 98.1°F | Resp 12 | Ht 65.0 in | Wt 256.0 lb

## 2017-03-17 DIAGNOSIS — R7309 Other abnormal glucose: Secondary | ICD-10-CM

## 2017-03-17 DIAGNOSIS — L732 Hidradenitis suppurativa: Secondary | ICD-10-CM

## 2017-03-17 DIAGNOSIS — D508 Other iron deficiency anemias: Secondary | ICD-10-CM

## 2017-03-17 DIAGNOSIS — D518 Other vitamin B12 deficiency anemias: Secondary | ICD-10-CM

## 2017-03-17 DIAGNOSIS — R300 Dysuria: Secondary | ICD-10-CM | POA: Diagnosis not present

## 2017-03-17 LAB — CBC WITH DIFFERENTIAL/PLATELET
Basophils Absolute: 0.1 10*3/uL (ref 0.0–0.1)
Basophils Relative: 0.8 % (ref 0.0–3.0)
Eosinophils Absolute: 0 10*3/uL (ref 0.0–0.7)
Eosinophils Relative: 0 % (ref 0.0–5.0)
HCT: 38.8 % (ref 36.0–46.0)
Hemoglobin: 12.7 g/dL (ref 12.0–15.0)
Lymphocytes Relative: 31.5 % (ref 12.0–46.0)
Lymphs Abs: 3.4 10*3/uL (ref 0.7–4.0)
MCHC: 32.8 g/dL (ref 30.0–36.0)
MCV: 87.6 fl (ref 78.0–100.0)
Monocytes Absolute: 0.7 10*3/uL (ref 0.1–1.0)
Monocytes Relative: 6.5 % (ref 3.0–12.0)
Neutro Abs: 6.6 10*3/uL (ref 1.4–7.7)
Neutrophils Relative %: 61.2 % (ref 43.0–77.0)
Platelets: 341 10*3/uL (ref 150.0–400.0)
RBC: 4.43 Mil/uL (ref 3.87–5.11)
RDW: 14.7 % (ref 11.5–15.5)
WBC: 10.9 10*3/uL — ABNORMAL HIGH (ref 4.0–10.5)

## 2017-03-17 LAB — HEMOGLOBIN A1C: Hgb A1c MFr Bld: 5.5 % (ref 4.6–6.5)

## 2017-03-17 MED ORDER — DOXYCYCLINE MONOHYDRATE 100 MG PO CAPS: 100.0000 mg | ORAL_CAPSULE | Freq: Two times a day (BID) | ORAL | 0 refills | Status: AC

## 2017-03-17 NOTE — Progress Notes (Signed)
Pre visit review using our clinic review tool, if applicable. No additional management support is needed unless otherwise documented below in the visit note. 

## 2017-03-17 NOTE — Progress Notes (Signed)
Subjective:  Patient ID: Gail Payne, female    DOB: Jul 02, 1971  Age: 46 y.o. MRN: [id number]  CC: Anemia   HPI BRELYNN WHELLER presents for f/up on anemia. She complains of old one-week history of a painful lump in her right axilla just under the skin. She thinks it's actually shrinking. There has not been any drainage from the area.  She also complains of fatigue with numbness and tingling in her fingers and is concerned that she may be anemic again and may have a low B12 level. She has not had any form of the B12 supplement for nearly 5 months.  Outpatient Medications Prior to Visit  Medication Sig Dispense Refill  . Cholecalciferol (VITAMIN D) 2000 UNITS CAPS Take by mouth daily.    . Cyanocobalamin (VITAMIN B-12 IJ) Inject as directed every 30 (thirty) days.    . DULERA 200-5 MCG/ACT AERO INHALE 2 PUFFS INTO THE LUNGS TWICE DAILY. 13 g 11  . ferrous sulfate 325 (65 FE) MG tablet TAKE 1 TABLET BY MOUTH TWICE DAILY WITH A MEAL 180 tablet 0  . LEVORA 0.15/30, 28, 0.15-30 MG-MCG tablet Take 1 tablet by mouth daily.     . meloxicam (MOBIC) 15 MG tablet TAKE 1 TABLET(15 MG) BY MOUTH DAILY 90 tablet 1  . mirabegron ER (MYRBETRIQ) 50 MG TB24 tablet Take 1 tablet (50 mg total) by mouth daily. 90 tablet 3  . montelukast (SINGULAIR) 10 MG tablet TAKE 1 TABLET BY MOUTH AT BEDTIME. 30 tablet 3  . Omega-3 Fatty Acids (FISH OIL CONCENTRATE PO) Take by mouth.    Marland Kitchen omeprazole (PRILOSEC) 40 MG capsule Take 1 capsule (40 mg total) by mouth daily. 90 capsule 3  . PROAIR RESPICLICK 476 (90 BASE) MCG/ACT AEPB INHALE 2 PUFFS IN TO LUNGS EVERY 4 HOURS AS NEEDED 1 each 11  . traMADol (ULTRAM) 50 MG tablet TAKE 1 TABLET BY MOUTH EVERY 8 HOURS AS NEEDED 50 tablet 3   No facility-administered medications prior to visit.     ROS Review of Systems  Constitutional: Positive for fatigue. Negative for appetite change, chills, diaphoresis, fever and unexpected weight change.  HENT: Negative.  Negative for  facial swelling and trouble swallowing.   Eyes: Negative.  Negative for visual disturbance.  Respiratory: Negative for cough, chest tightness, shortness of breath and wheezing.   Cardiovascular: Negative for chest pain, palpitations and leg swelling.  Gastrointestinal: Negative for abdominal pain, blood in stool, constipation, diarrhea, nausea and vomiting.  Endocrine: Negative.  Negative for cold intolerance, heat intolerance, polydipsia, polyphagia and polyuria.  Genitourinary: Negative for difficulty urinating and vaginal bleeding.  Musculoskeletal: Negative.  Negative for back pain and myalgias.  Skin: Negative.  Negative for color change and rash.  Allergic/Immunologic: Negative.   Neurological: Positive for numbness. Negative for dizziness, syncope, weakness, light-headedness and headaches.  Hematological: Negative for adenopathy. Does not bruise/bleed easily.  Psychiatric/Behavioral: Negative.     Objective:  BP 138/82 (BP Location: Left Arm, Patient Position: Sitting, Cuff Size: Large)   Pulse 64   Temp 98.1 F (36.7 C) (Oral)   Resp 12   Ht 5\' 5"  (1.651 m)   Wt 256 lb (116.1 kg)   LMP  (LMP Unknown)   SpO2 99%   BMI 42.60 kg/m   BP Readings from Last 3 Encounters:  03/17/17 138/82  06/27/16 128/60  06/05/16 128/87    Wt Readings from Last 3 Encounters:  03/17/17 256 lb (116.1 kg)  06/27/16 235 lb (106.6 kg)  06/05/16 234 lb (106.1 kg)    Physical Exam  Constitutional: She is oriented to person, place, and time. No distress.  HENT:  Mouth/Throat: Oropharynx is clear and moist. No oropharyngeal exudate.  Eyes: Conjunctivae are normal. Right eye exhibits no discharge. Left eye exhibits no discharge. No scleral icterus.  Neck: Normal range of motion. Neck supple. No JVD present. No tracheal deviation present. No thyromegaly present.  Cardiovascular: Normal rate, regular rhythm, S1 normal and intact distal pulses.  Exam reveals no gallop and no friction rub.     Murmur heard.  Systolic murmur is present with a grade of 1/6   No diastolic murmur is present  Pulmonary/Chest: Effort normal and breath sounds normal. No stridor. No respiratory distress. She has no wheezes. She has no rales. She exhibits no tenderness.  Abdominal: Soft. Bowel sounds are normal. She exhibits no distension and no mass. There is no tenderness. There is no rebound and no guarding.  Musculoskeletal: Normal range of motion. She exhibits no edema, tenderness or deformity.  Lymphadenopathy:    She has no cervical adenopathy.  Neurological: She is oriented to person, place, and time.  Skin: Skin is warm and dry. No rash noted. She is not diaphoretic. No erythema. No pallor.     Vitals reviewed.   Lab Results  Component Value Date   WBC 10.6 (H) 11/08/2015   HGB 12.0 11/08/2015   HCT 36.9 11/08/2015   PLT 375.0 11/08/2015   GLUCOSE 104 (H) 11/08/2015   CHOL 148 11/08/2015   TRIG 74.0 11/08/2015   HDL 33.50 (L) 11/08/2015   LDLCALC 100 (H) 11/08/2015   ALT 19 11/08/2015   AST 16 11/08/2015   NA 139 11/08/2015   K 3.8 11/08/2015   CL 104 11/08/2015   CREATININE 0.67 11/08/2015   BUN 15 11/08/2015   CO2 28 11/08/2015   TSH 1.12 11/08/2015   HGBA1C 5.6 11/08/2015    Dg Esophagus  Result Date: 06/26/2016 CLINICAL DATA:  Difficulty swallowing EXAM: ESOPHOGRAM / BARIUM SWALLOW / BARIUM TABLET STUDY TECHNIQUE: Combined double contrast and single contrast examination performed using effervescent crystals, thick barium liquid, and thin barium liquid. The patient was observed with fluoroscopy swallowing a 13 mm barium sulphate tablet. FLUOROSCOPY TIME:  Radiation Exposure Index (as provided by the fluoroscopic device): 66 deciGy per square cm If the device does not provide the exposure index: Fluoroscopy Time:  1 minutes 30 seconds Number of Acquired Images: FINDINGS: A double-contrast barium swallow was performed. The mucosa of the esophagus is unremarkable. A single  contrast study shows the swallowing mechanism to be normal. Esophageal peristalsis is normal. No hiatal hernia is seen. Only mild gastroesophageal reflux is noted. A barium pill was given at the end of the study which passed into the stomach without delay. IMPRESSION: 1. Mild gastroesophageal reflux. Barium pill passes into the stomach without delay. 2. No hiatal hernia. Electronically Signed   By: Ivar Drape M.D.   On: 06/26/2016 11:45    Assessment & Plan:   Zyana was seen today for anemia.  Diagnoses and all orders for this visit:  Other iron deficiency anemia- I will recheck her CBC and her iron level, will treat if indicated -     CBC with Differential/Platelet; Future -     IBC panel; Future -     Ferritin; Future  Other vitamin B12 deficiency anemia- I will recheck her CBC and her B12 level, will treat if indicated. -     CBC with  Differential/Platelet; Future -     Vitamin B12; Future -     Folate; Future  Other abnormal glucose- I will recheck her A1c to see if she has developed type 2 diabetes mellitus. -     Comprehensive metabolic panel; Future -     Hemoglobin A1c; Future  Hidradenitis suppurativa of right axilla -     doxycycline (MONODOX) 100 MG capsule; Take 1 capsule (100 mg total) by mouth 2 (two) times daily.   I have discontinued Ms. Maharaj's traMADol. I am also having her start on doxycycline. Additionally, I am having her maintain her Cyanocobalamin (VITAMIN B-12 IJ), LEVORA 0.15/30 (28), Omega-3 Fatty Acids (FISH OIL CONCENTRATE PO), Vitamin D, PROAIR RESPICLICK, mirabegron ER, omeprazole, DULERA, ferrous sulfate, meloxicam, and montelukast.  Meds ordered this encounter  Medications  . doxycycline (MONODOX) 100 MG capsule    Sig: Take 1 capsule (100 mg total) by mouth 2 (two) times daily.    Dispense:  20 capsule    Refill:  0     Follow-up: Return in about 3 weeks (around 04/07/2017).  Scarlette Calico, MD

## 2017-03-17 NOTE — Patient Instructions (Signed)
Hidradenitis Suppurativa Hidradenitis suppurativa is a long-term (chronic) skin disease that starts with blocked sweat glands or hair follicles. Bacteria may grow in these blocked openings of your skin. Hidradenitis suppurativa is like a severe form of acne that develops in areas of your body where acne would be unusual. It is most likely to affect the areas of your body where skin rubs against skin and becomes moist. This includes your:  Underarms.  Groin.  Genital areas.  Buttocks.  Upper thighs.  Breasts. Hidradenitis suppurativa may start out with small pimples. The pimples can develop into deep sores that break open (rupture) and drain pus. Over time your skin may thicken and become scarred. Hidradenitis suppurativa cannot be passed from person to person. What are the causes? The exact cause of hidradenitis suppurativa is not known. This condition may be due to:  Female and female hormones. The condition is rare before and after puberty.  An overactive body defense system (immune system). Your immune system may overreact to the blocked hair follicles or sweat glands and cause swelling and pus-filled sores. What increases the risk? You may have a higher risk of hidradenitis suppurativa if you:  Are a woman.  Are between ages 11 and 55.  Have a family history of hidradenitis suppurativa.  Have a personal history of acne.  Are overweight.  Smoke.  Take the drug lithium. What are the signs or symptoms? The first signs of an outbreak are usually painful skin bumps that look like pimples. As the condition progresses:  Skin bumps may get bigger and grow deeper into the skin.  Bumps under the skin may rupture and drain smelly pus.  Skin may become itchy and infected.  Skin may thicken and scar.  Drainage may continue through tunnels under the skin (fistulas).  Walking and moving your arms can become painful. How is this diagnosed? Your health care provider may diagnose  hidradenitis suppurativa based on your medical history and your signs and symptoms. A physical exam will also be done. You may need to see a health care provider who specializes in skin diseases (dermatologist). You may also have tests done to confirm the diagnosis. These can include:  Swabbing a sample of pus or drainage from your skin so it can be sent to the lab and tested for infection.  Blood tests to check for infection. How is this treated? The same treatment will not work for everybody with hidradenitis suppurativa. Your treatment will depend on how severe your symptoms are. You may need to try several treatments to find what works best for you. Part of your treatment may include cleaning and bandaging (dressing) your wounds. You may also have to take medicines, such as the following:  Antibiotics.  Acne medicines.  Medicines to block or suppress the immune system.  A diabetes medicine (metformin) is sometimes used to treat this condition.  For women, birth control pills can sometimes help relieve symptoms. You may need surgery if you have a severe case of hidradenitis suppurativa that does not respond to medicine. Surgery may involve:  Using a laser to clear the skin and remove hair follicles.  Opening and draining deep sores.  Removing the areas of skin that are diseased and scarred. Follow these instructions at home:  Learn as much as you can about your disease, and work closely with your health care providers.  Take medicines only as directed by your health care provider.  If you were prescribed an antibiotic medicine, finish it all even   if you start to feel better.  If you are overweight, losing weight may be very helpful. Try to reach and maintain a healthy weight.  Do not use any tobacco products, including cigarettes, chewing tobacco, or electronic cigarettes. If you need help quitting, ask your health care provider.  Do not shave the areas where you get  hidradenitis suppurativa.  Do not wear deodorant.  Wear loose-fitting clothes.  Try not to overheat and get sweaty.  Take a daily bleach bath as directed by your health care provider.  Fill your bathtub halfway with water.  Pour in  cup of unscented household bleach.  Soak for 5-10 minutes.  Cover sore areas with a warm, clean washcloth (compress) for 5-10 minutes. Contact a health care provider if:  You have a flare-up of hidradenitis suppurativa.  You have chills or a fever.  You are having trouble controlling your symptoms at home. This information is not intended to replace advice given to you by your health care provider. Make sure you discuss any questions you have with your health care provider. Document Released: 07/09/2004 Document Revised: 05/02/2016 Document Reviewed: 02/25/2014 Elsevier Interactive Patient Education  2017 [employer]  

## 2017-03-18 ENCOUNTER — Other Ambulatory Visit: Payer: Self-pay | Admitting: Internal Medicine

## 2017-03-18 ENCOUNTER — Encounter: Payer: Self-pay | Admitting: Internal Medicine

## 2017-03-18 DIAGNOSIS — D518 Other vitamin B12 deficiency anemias: Secondary | ICD-10-CM

## 2017-03-18 DIAGNOSIS — D508 Other iron deficiency anemias: Secondary | ICD-10-CM

## 2017-03-18 LAB — VITAMIN B12: Vitamin B-12: 189 pg/mL — ABNORMAL LOW (ref 211–911)

## 2017-03-18 LAB — URINALYSIS, ROUTINE W REFLEX MICROSCOPIC
Bilirubin Urine: NEGATIVE
Hgb urine dipstick: NEGATIVE
Ketones, ur: NEGATIVE
Leukocytes, UA: NEGATIVE
Nitrite: NEGATIVE
RBC / HPF: NONE SEEN (ref 0–?)
Specific Gravity, Urine: 1.02 (ref 1.000–1.030)
Total Protein, Urine: NEGATIVE
Urine Glucose: NEGATIVE
Urobilinogen, UA: 1 (ref 0.0–1.0)
pH: 7 (ref 5.0–8.0)

## 2017-03-18 LAB — COMPREHENSIVE METABOLIC PANEL
ALT: 14 U/L (ref 0–35)
AST: 14 U/L (ref 0–37)
Albumin: 4 g/dL (ref 3.5–5.2)
Alkaline Phosphatase: 63 U/L (ref 39–117)
BUN: 14 mg/dL (ref 6–23)
CO2: 25 mEq/L (ref 19–32)
Calcium: 9.2 mg/dL (ref 8.4–10.5)
Chloride: 103 mEq/L (ref 96–112)
Creatinine, Ser: 0.69 mg/dL (ref 0.40–1.20)
GFR: 117.74 mL/min (ref >60.00)
Glucose, Bld: 89 mg/dL (ref 70–99)
Potassium: 4.2 mEq/L (ref 3.5–5.1)
Sodium: 136 mEq/L (ref 135–145)
Total Bilirubin: 0.6 mg/dL (ref 0.2–1.2)
Total Protein: 7.7 g/dL (ref 6.0–8.3)

## 2017-03-18 LAB — IBC PANEL
Iron: 37 ug/dL — ABNORMAL LOW (ref 42–145)
Saturation Ratios: 12.3 % — ABNORMAL LOW (ref 20.0–50.0)
Transferrin: 215 mg/dL (ref 212.0–360.0)

## 2017-03-18 LAB — FOLATE: Folate: 10.2 ng/mL (ref >5.9)

## 2017-03-18 LAB — FERRITIN: Ferritin: 225.8 ng/mL (ref 10.0–291.0)

## 2017-03-18 MED ORDER — FERIVA 21/7 75-1 MG PO TABS: 1.0000 | ORAL_TABLET | Freq: Every day | ORAL | 11 refills | Status: DC

## 2017-03-20 ENCOUNTER — Encounter: Payer: Self-pay | Admitting: Internal Medicine

## 2017-03-20 LAB — CULTURE, URINE COMPREHENSIVE

## 2017-03-21 ENCOUNTER — Ambulatory Visit (INDEPENDENT_AMBULATORY_CARE_PROVIDER_SITE_OTHER): Payer: BLUE CROSS/BLUE SHIELD

## 2017-03-21 DIAGNOSIS — E538 Deficiency of other specified B group vitamins: Secondary | ICD-10-CM

## 2017-03-21 MED ORDER — CYANOCOBALAMIN 1000 MCG/ML IJ SOLN
1000.0000 ug | Freq: Once | INTRAMUSCULAR | Status: AC
  Administered 2017-03-21: 1000 ug via INTRAMUSCULAR

## 2017-03-25 NOTE — Telephone Encounter (Signed)
PA started via covermymeds KEY: [id number]

## 2017-03-27 ENCOUNTER — Other Ambulatory Visit: Payer: Self-pay | Admitting: Internal Medicine

## 2017-03-27 ENCOUNTER — Encounter: Payer: Self-pay | Admitting: Internal Medicine

## 2017-03-27 DIAGNOSIS — D500 Iron deficiency anemia secondary to blood loss (chronic): Secondary | ICD-10-CM

## 2017-03-27 MED ORDER — FERROUS SULFATE 325 (65 FE) MG PO TABS: ORAL_TABLET | ORAL | 1 refills | Status: DC

## 2017-03-28 ENCOUNTER — Ambulatory Visit: Payer: BLUE CROSS/BLUE SHIELD | Admitting: Internal Medicine

## 2017-04-07 ENCOUNTER — Other Ambulatory Visit: Payer: Self-pay | Admitting: Internal Medicine

## 2017-04-07 DIAGNOSIS — D500 Iron deficiency anemia secondary to blood loss (chronic): Secondary | ICD-10-CM

## 2017-07-08 LAB — HM PAP SMEAR

## 2017-07-19 ENCOUNTER — Other Ambulatory Visit: Payer: Self-pay | Admitting: Internal Medicine

## 2017-07-24 ENCOUNTER — Other Ambulatory Visit: Payer: Self-pay | Admitting: Internal Medicine

## 2017-07-24 DIAGNOSIS — N3281 Overactive bladder: Secondary | ICD-10-CM

## 2017-08-27 LAB — HM MAMMOGRAPHY: HM Mammogram: NORMAL (ref 0–4)

## 2017-09-01 ENCOUNTER — Other Ambulatory Visit: Payer: Self-pay | Admitting: Internal Medicine

## 2017-09-01 DIAGNOSIS — M170 Bilateral primary osteoarthritis of knee: Secondary | ICD-10-CM

## 2017-09-01 LAB — HEPATIC FUNCTION PANEL
ALT: 16 (ref 7–35)
AST: 15 (ref 13–35)
Alkaline Phosphatase: 73 (ref 25–125)
Bilirubin, Direct: 0.11
Bilirubin, Total: 0.4

## 2017-09-01 LAB — LIPID PANEL
Cholesterol: 151 (ref 0–200)
HDL: 39 (ref 35–70)
LDL Cholesterol: 86
LDl/HDL Ratio: 3.9
Triglycerides: 128 (ref 40–160)

## 2017-09-01 LAB — BASIC METABOLIC PANEL
BUN: 20 (ref 4–21)
Creatinine: 0.7 (ref 0.5–1.1)
Glucose: 107
Potassium: 4.6 (ref 3.4–5.3)
Sodium: 140 (ref 137–147)

## 2017-09-01 LAB — CBC AND DIFFERENTIAL
HCT: 41 (ref 36–46)
Hemoglobin: 13 (ref 12.0–16.0)
Platelets: 375 (ref 150–399)

## 2017-09-01 LAB — TSH: TSH: 0.56 (ref <=5.90)

## 2017-09-01 NOTE — Telephone Encounter (Signed)
rx faxed to [facility] on [address]

## 2017-09-17 ENCOUNTER — Encounter: Payer: Self-pay | Admitting: Internal Medicine

## 2017-12-17 ENCOUNTER — Encounter: Payer: Self-pay | Admitting: Internal Medicine

## 2017-12-17 ENCOUNTER — Ambulatory Visit: Payer: BLUE CROSS/BLUE SHIELD | Admitting: Internal Medicine

## 2017-12-17 VITALS — BP 124/80 | HR 65 | Temp 98.4°F | Resp 16 | Ht 65.0 in | Wt 257.0 lb

## 2017-12-17 DIAGNOSIS — E538 Deficiency of other specified B group vitamins: Secondary | ICD-10-CM

## 2017-12-17 DIAGNOSIS — M5416 Radiculopathy, lumbar region: Secondary | ICD-10-CM | POA: Diagnosis not present

## 2017-12-17 MED ORDER — CYANOCOBALAMIN 1000 MCG/ML IJ SOLN
1000.0000 ug | Freq: Once | INTRAMUSCULAR | Status: AC
  Administered 2017-12-17: 1000 ug via INTRAMUSCULAR

## 2017-12-17 NOTE — Patient Instructions (Signed)

## 2017-12-17 NOTE — Progress Notes (Signed)
Subjective:  Patient ID: Gail Payne, female    DOB: 02/20/1971  Age: 47 y.o. MRN: [id number]  CC: Back Pain   HPI Gail Payne presents for a 63-month history of right-sided lower back pain that has been worsening over the last 2 months.  She describes a sharp pain that radiates into her right lower extremity.  The pain keeps her awake at night.  She is taking ibuprofen and tramadol for symptom relief.  She denies any recent trauma or injury.  She said her right leg feels weak but she has not noticed any numbness or tingling.  Outpatient Medications Prior to Visit  Medication Sig Dispense Refill  . Cholecalciferol (VITAMIN D) 2000 UNITS CAPS Take by mouth daily.    . Cyanocobalamin (VITAMIN B-12 IJ) Inject as directed every 30 (thirty) days.    . DULERA 200-5 MCG/ACT AERO INHALE 2 PUFFS INTO THE LUNGS TWICE DAILY. 13 g 11  . ferrous sulfate 325 (65 FE) MG tablet TAKE 1 TABLET BY MOUTH TWICE DAILY WITH A MEAL 180 tablet 1  . meloxicam (MOBIC) 15 MG tablet TAKE 1 TABLET(15 MG) BY MOUTH DAILY 90 tablet 0  . omeprazole (PRILOSEC) 40 MG capsule Take 1 capsule (40 mg total) by mouth daily. 90 capsule 3  . PROAIR RESPICLICK 409 (90 BASE) MCG/ACT AEPB INHALE 2 PUFFS IN TO LUNGS EVERY 4 HOURS AS NEEDED 1 each 11  . traMADol (ULTRAM) 50 MG tablet TAKE 1 TABLET BY MOUTH EVERY 8 HOURS AS NEEDED 50 tablet 0  . ferrous sulfate 325 (65 FE) MG tablet TAKE 1 TABLET BY MOUTH TWICE DAILY WITH A MEAL 180 tablet 0  . LEVORA 0.15/30, 28, 0.15-30 MG-MCG tablet Take 1 tablet by mouth daily.     . montelukast (SINGULAIR) 10 MG tablet TAKE 1 TABLET BY MOUTH AT BEDTIME. 30 tablet 3  . MYRBETRIQ 50 MG TB24 tablet TAKE 1 TABLET(50 MG) BY MOUTH DAILY 90 tablet 1  . Omega-3 Fatty Acids (FISH OIL CONCENTRATE PO) Take by mouth.     No facility-administered medications prior to visit.     ROS Review of Systems  Constitutional: Negative for chills, diaphoresis, fatigue and fever.  HENT: Negative.   Eyes:  Negative.   Respiratory: Negative for cough, chest tightness, shortness of breath and wheezing.   Cardiovascular: Negative for chest pain, palpitations and leg swelling.  Gastrointestinal: Negative for abdominal pain, constipation, diarrhea, nausea and vomiting.  Endocrine: Negative.   Genitourinary: Negative.   Musculoskeletal: Positive for back pain. Negative for joint swelling and myalgias.  Skin: Negative for rash.  Neurological: Positive for weakness. Negative for dizziness and numbness.  Hematological: Negative for adenopathy. Does not bruise/bleed easily.  Psychiatric/Behavioral: Negative.     Objective:  BP 124/80 (BP Location: Left Arm, Patient Position: Sitting, Cuff Size: Large)   Pulse 65   Temp 98.4 F (36.9 C) (Oral)   Resp 16   Ht 5\' 5"  (1.651 m)   Wt 257 lb (116.6 kg)   LMP 12/11/2017 Comment: s/p BTL  SpO2 98%   BMI 42.77 kg/m   BP Readings from Last 3 Encounters:  12/17/17 124/80  03/17/17 138/82  06/27/16 128/60    Wt Readings from Last 3 Encounters:  12/17/17 257 lb (116.6 kg)  03/17/17 256 lb (116.1 kg)  06/27/16 235 lb (106.6 kg)    Physical Exam  Constitutional: She is oriented to person, place, and time. No distress.  HENT:  Mouth/Throat: Oropharynx is clear and moist.  Eyes:  Conjunctivae are normal. Left eye exhibits no discharge. No scleral icterus.  Neck: Normal range of motion. Neck supple. No JVD present. No thyromegaly present.  Cardiovascular: Normal rate, regular rhythm and normal heart sounds.  No murmur heard. Pulmonary/Chest: Effort normal and breath sounds normal. No respiratory distress. She has no wheezes. She has no rales.  Abdominal: Soft. Bowel sounds are normal. She exhibits no mass. There is no tenderness. There is no guarding.  Musculoskeletal: She exhibits no edema or tenderness.       Lumbar back: She exhibits normal range of motion, no tenderness, no bony tenderness, no swelling, no edema, no deformity, no pain and no  spasm.  Lymphadenopathy:    She has no cervical adenopathy.  Neurological: She is oriented to person, place, and time. She has normal strength. She displays no atrophy and normal reflexes. No cranial nerve deficit or sensory deficit. She exhibits normal muscle tone. Coordination and gait normal.  Reflex Scores:      Tricep reflexes are 1+ on the right side and 1+ on the left side.      Bicep reflexes are 1+ on the right side and 1+ on the left side.      Brachioradialis reflexes are 1+ on the right side and 1+ on the left side.      Patellar reflexes are 1+ on the right side and 1+ on the left side.      Achilles reflexes are 1+ on the right side and 1+ on the left side. + SLR in RLE - SLR in LLE  Skin: No rash noted. She is not diaphoretic.  Vitals reviewed.   Lab Results  Component Value Date   WBC 10.9 (H) 03/17/2017   HGB 13.0 09/01/2017   HCT 41 09/01/2017   PLT 375 09/01/2017   GLUCOSE 89 03/17/2017   CHOL 151 09/01/2017   TRIG 128 09/01/2017   HDL 39 09/01/2017   LDLCALC 86 09/01/2017   ALT 16 09/01/2017   AST 15 09/01/2017   NA 140 09/01/2017   K 4.6 09/01/2017   CL 103 03/17/2017   CREATININE 0.7 09/01/2017   BUN 20 09/01/2017   CO2 25 03/17/2017   TSH 0.56 09/01/2017   HGBA1C 5.5 03/17/2017    Dg Esophagus  Result Date: 06/26/2016 CLINICAL DATA:  Difficulty swallowing EXAM: ESOPHOGRAM / BARIUM SWALLOW / BARIUM TABLET STUDY TECHNIQUE: Combined double contrast and single contrast examination performed using effervescent crystals, thick barium liquid, and thin barium liquid. The patient was observed with fluoroscopy swallowing a 13 mm barium sulphate tablet. FLUOROSCOPY TIME:  Radiation Exposure Index (as provided by the fluoroscopic device): 66 deciGy per square cm If the device does not provide the exposure index: Fluoroscopy Time:  1 minutes 30 seconds Number of Acquired Images: FINDINGS: A double-contrast barium swallow was performed. The mucosa of the esophagus is  unremarkable. A single contrast study shows the swallowing mechanism to be normal. Esophageal peristalsis is normal. No hiatal hernia is seen. Only mild gastroesophageal reflux is noted. A barium pill was given at the end of the study which passed into the stomach without delay. IMPRESSION: 1. Mild gastroesophageal reflux. Barium pill passes into the stomach without delay. 2. No hiatal hernia. Electronically Signed   By: Ivar Drape M.D.   On: 06/26/2016 11:45    Assessment & Plan:   Raneen was seen today for back pain.  Diagnoses and all orders for this visit:  Right lumbar radiculitis- She has a 97-month history of radicular  right low back pain with a subjective sensation of weakness but a normal neurologic exam today.  I have asked her to undergo an MRI of the lumbar spine to screen for disc herniation, spinal stenosis, nerve impingement, or tumor.  She will continue ibuprofen and tramadol as needed for pain.  I have also asked her to start physical therapy for symptom relief. -     Ambulatory referral to Physical Therapy -     MR Lumbar Spine Wo Contrast; Future  B12 deficiency -     cyanocobalamin ((VITAMIN B-12)) injection 1,000 mcg   I have discontinued Raymon Mutton. Mcculley's LEVORA 0.15/30 (28), Omega-3 Fatty Acids (FISH OIL CONCENTRATE PO), montelukast, and MYRBETRIQ. I am also having her maintain her Cyanocobalamin (VITAMIN B-12 IJ), Vitamin D, PROAIR RESPICLICK, omeprazole, DULERA, ferrous sulfate, meloxicam, and traMADol. We administered cyanocobalamin.  Meds ordered this encounter  Medications  . cyanocobalamin ((VITAMIN B-12)) injection 1,000 mcg     Follow-up: Return in about 6 weeks (around 01/28/2018).  Scarlette Calico, MD

## 2017-12-18 ENCOUNTER — Ambulatory Visit: Payer: BLUE CROSS/BLUE SHIELD | Admitting: Internal Medicine

## 2017-12-28 ENCOUNTER — Ambulatory Visit
Admission: RE | Admit: 2017-12-28 | Discharge: 2017-12-28 | Disposition: A | Discharge status: Home / Self Care | Payer: BLUE CROSS/BLUE SHIELD | Source: Ambulatory Visit | Attending: Internal Medicine | Admitting: Internal Medicine

## 2017-12-28 ENCOUNTER — Inpatient Hospital Stay: Admission: RE | Admit: 2017-12-28 | Payer: BLUE CROSS/BLUE SHIELD | Source: Ambulatory Visit

## 2017-12-28 DIAGNOSIS — M5416 Radiculopathy, lumbar region: Secondary | ICD-10-CM

## 2017-12-28 DIAGNOSIS — M48061 Spinal stenosis, lumbar region without neurogenic claudication: Secondary | ICD-10-CM | POA: Diagnosis not present

## 2017-12-29 ENCOUNTER — Encounter: Payer: Self-pay | Admitting: Internal Medicine

## 2018-01-17 ENCOUNTER — Other Ambulatory Visit: Payer: Self-pay | Admitting: Internal Medicine

## 2018-01-17 DIAGNOSIS — K219 Gastro-esophageal reflux disease without esophagitis: Secondary | ICD-10-CM

## 2018-02-15 ENCOUNTER — Other Ambulatory Visit: Payer: Self-pay | Admitting: Internal Medicine

## 2018-02-15 DIAGNOSIS — M170 Bilateral primary osteoarthritis of knee: Secondary | ICD-10-CM

## 2018-02-17 ENCOUNTER — Ambulatory Visit: Payer: BLUE CROSS/BLUE SHIELD | Admitting: Internal Medicine

## 2018-02-17 ENCOUNTER — Encounter: Payer: Self-pay | Admitting: Internal Medicine

## 2018-02-17 VITALS — BP 124/86 | HR 70 | Temp 98.3°F | Resp 16 | Wt 266.0 lb

## 2018-02-17 DIAGNOSIS — D518 Other vitamin B12 deficiency anemias: Secondary | ICD-10-CM

## 2018-02-17 DIAGNOSIS — R42 Dizziness and giddiness: Secondary | ICD-10-CM

## 2018-02-17 DIAGNOSIS — H6982 Other specified disorders of Eustachian tube, left ear: Secondary | ICD-10-CM | POA: Insufficient documentation

## 2018-02-17 DIAGNOSIS — R7309 Other abnormal glucose: Secondary | ICD-10-CM | POA: Diagnosis not present

## 2018-02-17 DIAGNOSIS — E538 Deficiency of other specified B group vitamins: Secondary | ICD-10-CM | POA: Diagnosis not present

## 2018-02-17 DIAGNOSIS — J4540 Moderate persistent asthma, uncomplicated: Secondary | ICD-10-CM | POA: Diagnosis not present

## 2018-02-17 DIAGNOSIS — J069 Acute upper respiratory infection, unspecified: Secondary | ICD-10-CM | POA: Diagnosis not present

## 2018-02-17 DIAGNOSIS — H6992 Unspecified Eustachian tube disorder, left ear: Secondary | ICD-10-CM

## 2018-02-17 MED ORDER — AZITHROMYCIN 250 MG PO TABS: ORAL_TABLET | ORAL | 1 refills | Status: DC

## 2018-02-17 MED ORDER — CYANOCOBALAMIN 1000 MCG/ML IJ SOLN
1000.0000 ug | Freq: Once | INTRAMUSCULAR | Status: AC
  Administered 2018-02-17: 1000 ug via INTRAMUSCULAR

## 2018-02-17 NOTE — Assessment & Plan Note (Signed)
For B12 replacement today IM 1000

## 2018-02-17 NOTE — Assessment & Plan Note (Signed)
stable overall by history and exam, recent data reviewed with pt, and pt to continue medical treatment as before,  to f/u any worsening symptoms or concerns  

## 2018-02-17 NOTE — Assessment & Plan Note (Signed)
stable overall by history and exam, recent data reviewed with pt, and pt to continue medical treatment as before,  to f/u any worsening symptoms or concerns Lab Results  Component Value Date   HGBA1C 5.5 03/17/2017    

## 2018-02-17 NOTE — Progress Notes (Signed)
Subjective:    Patient ID: Gail Payne, female    DOB: 1971-08-22, 47 y.o.   MRN: [id number]  HPI    Here with 2-3 days acute onset fever, facial pain, pressure, headache, general weakness and malaise, and greenish d/c, with mild ST and cough, but pt denies chest pain, wheezing, increased sob or doe, orthopnea, PND, increased LE swelling, palpitations, dizziness or syncope.  Also with left ear pressure , reduced hearing, vertigo x 1 yesterday for a few seconds only.  Pt denies new neurological symptoms such as new headache, or facial or extremity weakness or numbness   Pt denies polydipsia, polyuria.  Also has Right lumbar pain improved with tx and going to the gym for self PT.  Due for B12 IM today  No other interval hx or new complaints Past Medical History:  Diagnosis Date  . Anemia    iron deficiency  . Asthma   . Fibroids    uterine  . GERD (gastroesophageal reflux disease)   . Heart murmur    discovered during labor and delivery 5 yrs ago   Past Surgical History:  Procedure Laterality Date  . APPENDECTOMY    . CESAREAN SECTION     x1  . TUBAL LIGATION      reports that  has never smoked. she has never used smokeless tobacco. She reports that she does not drink alcohol or use drugs. family history includes Arthritis in her other; Asthma in her father; Diabetes in her other; Heart disease in her father; Hyperlipidemia in her other; Hypertension in her mother and other; Learning disabilities in her maternal aunt. No Known Allergies Current Outpatient Medications on File Prior to Visit  Medication Sig Dispense Refill  . Cholecalciferol (VITAMIN D) 2000 UNITS CAPS Take by mouth daily.    . Cyanocobalamin (VITAMIN B-12 IJ) Inject as directed every 30 (thirty) days.    . DULERA 200-5 MCG/ACT AERO INHALE 2 PUFFS INTO THE LUNGS TWICE DAILY. 13 g 11  . ferrous sulfate 325 (65 FE) MG tablet TAKE 1 TABLET BY MOUTH TWICE DAILY WITH A MEAL 180 tablet 1  . meloxicam (MOBIC) 15 MG tablet  TAKE 1 TABLET(15 MG) BY MOUTH DAILY 90 tablet 0  . omeprazole (PRILOSEC) 40 MG capsule TAKE 1 CAPSULE(40 MG) BY MOUTH DAILY 90 capsule 0  . PROAIR RESPICLICK 063 (90 BASE) MCG/ACT AEPB INHALE 2 PUFFS IN TO LUNGS EVERY 4 HOURS AS NEEDED 1 each 11  . traMADol (ULTRAM) 50 MG tablet TAKE 1 TABLET BY MOUTH EVERY 8 HOURS AS NEEDED 50 tablet 0   No current facility-administered medications on file prior to visit.    Review of Systems  Constitutional: Negative for other unusual diaphoresis or sweats HENT: Negative for ear discharge or swelling Eyes: Negative for other worsening visual disturbances Respiratory: Negative for stridor or other swelling  Gastrointestinal: Negative for worsening distension or other blood Genitourinary: Negative for retention or other urinary change Musculoskeletal: Negative for other MSK pain or swelling Skin: Negative for color change or other new lesions Neurological: Negative for worsening tremors and other numbness  Psychiatric/Behavioral: Negative for worsening agitation or other fatigue All other system neg per pt    Objective:   Physical Exam BP 124/86   Pulse 70   Temp 98.3 F (36.8 C) (Oral)   Resp 16   Wt 266 lb (120.7 kg)   SpO2 98%   BMI 44.26 kg/m  VS noted, mild ill Constitutional: Pt appears in NAD HENT: Head: NCAT.  Right Ear: External ear normal.  Left Ear: External ear normal.  Left TM with mod erythema and post effusion Eyes: . Pupils are equal, round, and reactive to light. Conjunctivae and EOM are normal Nose: without d/c or deformity Bilat tm's with mild erythema.  Max sinus areas mild tender.  Pharynx with mild erythema, no exudate  Neck: Neck supple. Gross normal ROM Cardiovascular: Normal rate and regular rhythm.   Pulmonary/Chest: Effort normal and breath sounds without rales or wheezing.  Abd:  Soft, NT, ND, + BS, no organomegaly Neurological: Pt is alert. At baseline orientation, motor grossly intact Skin: Skin is warm. No  rashes, other new lesions, no LE edema Psychiatric: Pt behavior is normal without agitation  No other exam findings    Assessment & Plan:

## 2018-02-17 NOTE — Assessment & Plan Note (Signed)
Mild to mod, for antibx course,  to f/u any worsening symptoms or concerns 

## 2018-02-17 NOTE — Assessment & Plan Note (Signed)
Also for mucinex D as above

## 2018-02-17 NOTE — Patient Instructions (Addendum)
You had the B12 shot today, and given the work note  Please take all new medication as prescribed - the antibiotic  You can also take Delsym OTC for cough, and/or Mucinex D (or it's generic off brand) for congestion, and tylenol as needed for pain.  Please continue all other medications as before, and refills have been done if requested.  Please have the pharmacy call with any other refills you may need.  Please keep your appointments with your specialists as you may have planned

## 2018-02-17 NOTE — Assessment & Plan Note (Signed)
Mild short lived, declines meclizine, ok for mucinex D otc prn

## 2018-04-01 ENCOUNTER — Encounter: Payer: Self-pay | Admitting: Internal Medicine

## 2018-04-01 LAB — LIPID PANEL
HDL: 46 (ref 35–70)
LDL Cholesterol: 107
Triglycerides: 60 (ref 40–160)

## 2018-04-01 LAB — CBC AND DIFFERENTIAL
HCT: 37 (ref 36–46)
Hemoglobin: 11.9 — AB (ref 12.0–16.0)
Platelets: 370 (ref 150–399)
WBC: 7.9

## 2018-04-01 LAB — BASIC METABOLIC PANEL
BUN: 14 (ref 4–21)
Creatinine: 0.6 (ref 0.5–1.1)
Glucose: 88
Potassium: 4.5 (ref 3.4–5.3)
Sodium: 138 (ref 137–147)

## 2018-04-01 LAB — HEPATIC FUNCTION PANEL
ALT: 16 (ref 7–35)
AST: 19 (ref 13–35)

## 2018-04-01 LAB — TSH: TSH: 0.73 (ref 0.41–5.90)

## 2018-04-01 LAB — IRON,TIBC AND FERRITIN PANEL: Iron: 57

## 2018-04-01 LAB — HEMOGLOBIN A1C: Hemoglobin A1C: 5.6

## 2018-05-19 DIAGNOSIS — M722 Plantar fascial fibromatosis: Secondary | ICD-10-CM | POA: Diagnosis not present

## 2018-05-19 DIAGNOSIS — M79671 Pain in right foot: Secondary | ICD-10-CM | POA: Diagnosis not present

## 2018-05-19 DIAGNOSIS — M79672 Pain in left foot: Secondary | ICD-10-CM | POA: Diagnosis not present

## 2018-05-20 ENCOUNTER — Ambulatory Visit (INDEPENDENT_AMBULATORY_CARE_PROVIDER_SITE_OTHER): Payer: BLUE CROSS/BLUE SHIELD

## 2018-05-20 DIAGNOSIS — E538 Deficiency of other specified B group vitamins: Secondary | ICD-10-CM | POA: Diagnosis not present

## 2018-05-20 MED ORDER — CYANOCOBALAMIN 1000 MCG/ML IJ SOLN
1000.0000 ug | Freq: Once | INTRAMUSCULAR | Status: AC
  Administered 2018-05-20: 1000 ug via INTRAMUSCULAR

## 2018-05-20 NOTE — Progress Notes (Signed)
I have reviewed and agree.

## 2018-06-18 ENCOUNTER — Other Ambulatory Visit: Payer: Self-pay | Admitting: Internal Medicine

## 2018-06-18 DIAGNOSIS — K219 Gastro-esophageal reflux disease without esophagitis: Secondary | ICD-10-CM

## 2018-06-25 ENCOUNTER — Other Ambulatory Visit: Payer: Self-pay | Admitting: Internal Medicine

## 2018-06-25 DIAGNOSIS — J4540 Moderate persistent asthma, uncomplicated: Secondary | ICD-10-CM

## 2018-06-25 MED ORDER — MONTELUKAST SODIUM 10 MG PO TABS: 10.0000 mg | ORAL_TABLET | Freq: Every day | ORAL | 1 refills | Status: DC

## 2018-07-06 ENCOUNTER — Other Ambulatory Visit (INDEPENDENT_AMBULATORY_CARE_PROVIDER_SITE_OTHER): Payer: BLUE CROSS/BLUE SHIELD

## 2018-07-06 ENCOUNTER — Ambulatory Visit (INDEPENDENT_AMBULATORY_CARE_PROVIDER_SITE_OTHER): Payer: BLUE CROSS/BLUE SHIELD | Admitting: Internal Medicine

## 2018-07-06 ENCOUNTER — Encounter: Payer: Self-pay | Admitting: Internal Medicine

## 2018-07-06 VITALS — BP 122/60 | HR 65 | Temp 97.8°F | Resp 16 | Ht 65.0 in | Wt 272.0 lb

## 2018-07-06 DIAGNOSIS — D510 Vitamin B12 deficiency anemia due to intrinsic factor deficiency: Secondary | ICD-10-CM

## 2018-07-06 DIAGNOSIS — R011 Cardiac murmur, unspecified: Secondary | ICD-10-CM | POA: Diagnosis not present

## 2018-07-06 DIAGNOSIS — D500 Iron deficiency anemia secondary to blood loss (chronic): Secondary | ICD-10-CM

## 2018-07-06 LAB — CBC WITH DIFFERENTIAL/PLATELET
Basophils Absolute: 0 10*3/uL (ref 0.0–0.1)
Basophils Relative: 0.4 % (ref 0.0–3.0)
Eosinophils Absolute: 0 10*3/uL (ref 0.0–0.7)
Eosinophils Relative: 0.3 % (ref 0.0–5.0)
HCT: 36 % (ref 36.0–46.0)
Hemoglobin: 11.9 g/dL — ABNORMAL LOW (ref 12.0–15.0)
Lymphocytes Relative: 25.1 % (ref 12.0–46.0)
Lymphs Abs: 2.6 10*3/uL (ref 0.7–4.0)
MCHC: 33 g/dL (ref 30.0–36.0)
MCV: 86.2 fl (ref 78.0–100.0)
Monocytes Absolute: 0.4 10*3/uL (ref 0.1–1.0)
Monocytes Relative: 3.5 % (ref 3.0–12.0)
Neutro Abs: 7.4 10*3/uL (ref 1.4–7.7)
Neutrophils Relative %: 70.7 % (ref 43.0–77.0)
Platelets: 329 10*3/uL (ref 150.0–400.0)
RBC: 4.17 Mil/uL (ref 3.87–5.11)
RDW: 15.7 % — ABNORMAL HIGH (ref 11.5–15.5)
WBC: 10.5 10*3/uL (ref 4.0–10.5)

## 2018-07-06 MED ORDER — CYANOCOBALAMIN 1000 MCG/ML IJ SOLN: 1000.0000 ug | INTRAMUSCULAR | 3 refills | Status: DC

## 2018-07-06 MED ORDER — "SYRINGE/NEEDLE (DISP) 25G X 5/8"" 3 ML MISC": 3 refills | Status: DC

## 2018-07-06 NOTE — Patient Instructions (Signed)
Anemia Anemia is a condition in which you do not have enough red blood cells or hemoglobin. Hemoglobin is a substance in red blood cells that carries oxygen. When you do not have enough red blood cells or hemoglobin (are anemic), your body cannot get enough oxygen and your organs may not work properly. As a result, you may feel very tired or have other problems. What are the causes? Common causes of anemia include:  Excessive bleeding. Anemia can be caused by excessive bleeding inside or outside the body, including bleeding from the intestine or from periods in women.  Poor nutrition.  Long-lasting (chronic) kidney, thyroid, and liver disease.  Bone marrow disorders.  Cancer and treatments for cancer.  HIV (human immunodeficiency virus) and AIDS (acquired immunodeficiency syndrome).  Treatments for HIV and AIDS.  Spleen problems.  Blood disorders.  Infections, medicines, and autoimmune disorders that destroy red blood cells.  What are the signs or symptoms? Symptoms of this condition include:  Minor weakness.  Dizziness.  Headache.  Feeling heartbeats that are irregular or faster than normal (palpitations).  Shortness of breath, especially with exercise.  Paleness.  Cold sensitivity.  Indigestion.  Nausea.  Difficulty sleeping.  Difficulty concentrating.  Symptoms may occur suddenly or develop slowly. If your anemia is mild, you may not have symptoms. How is this diagnosed? This condition is diagnosed based on:  Blood tests.  Your medical history.  A physical exam.  Bone marrow biopsy.  Your health care provider may also check your stool (feces) for blood and may do additional testing to look for the cause of your bleeding. You may also have other tests, including:  Imaging tests, such as a CT scan or MRI.  Endoscopy.  Colonoscopy.  How is this treated? Treatment for this condition depends on the cause. If you continue to lose a lot of blood,  you may need to be treated at a hospital. Treatment may include:  Taking supplements of iron, vitamin B12, or folic acid.  Taking a hormone medicine (erythropoietin) that can help to stimulate red blood cell growth.  Having a blood transfusion. This may be needed if you lose a lot of blood.  Making changes to your diet.  Having surgery to remove your spleen.  Follow these instructions at home:  Take over-the-counter and prescription medicines only as told by your health care provider.  Take supplements only as told by your health care provider.  Follow any diet instructions that you were given.  Keep all follow-up visits as told by your health care provider. This is important. Contact a health care provider if:  You develop new bleeding anywhere in the body. Get help right away if:  You are very weak.  You are short of breath.  You have pain in your abdomen or chest.  You are dizzy or feel faint.  You have trouble concentrating.  You have bloody or black, tarry stools.  You vomit repeatedly or you vomit up blood. Summary  Anemia is a condition in which you do not have enough red blood cells or enough of a substance in your red blood cells that carries oxygen (hemoglobin).  Symptoms may occur suddenly or develop slowly.  If your anemia is mild, you may not have symptoms.  This condition is diagnosed with blood tests as well as a medical history and physical exam. Other tests may be needed.  Treatment for this condition depends on the cause of the anemia. This information is not intended to replace advice   given to you by your health care provider. Make sure you discuss any questions you have with your health care provider. Document Released: 01/02/2005 Document Revised: 12/27/2016 Document Reviewed: 12/27/2016 Elsevier Interactive Patient Education  [employer].

## 2018-07-06 NOTE — Progress Notes (Signed)
Subjective:  Patient ID: Gail Payne, female    DOB: 13-Dec-1970  Age: 47 y.o. MRN: [id number]  CC: Anemia   HPI KRISTA GODSIL presents for f/up on anemia with B12 defic.  She wants to start giving herself the B12 supplement monthly.  Her last B12 shot was about 6 weeks ago.  She feels well today and offers no complaints.  Outpatient Medications Prior to Visit  Medication Sig Dispense Refill  . Cholecalciferol (VITAMIN D) 2000 UNITS CAPS Take by mouth daily.    . DULERA 200-5 MCG/ACT AERO INHALE 2 PUFFS INTO THE LUNGS TWICE DAILY. 13 g 11  . ferrous sulfate 325 (65 FE) MG tablet TAKE 1 TABLET BY MOUTH TWICE DAILY WITH A MEAL 180 tablet 1  . meloxicam (MOBIC) 15 MG tablet TAKE 1 TABLET(15 MG) BY MOUTH DAILY 90 tablet 0  . montelukast (SINGULAIR) 10 MG tablet Take 1 tablet (10 mg total) by mouth at bedtime. 90 tablet 1  . omeprazole (PRILOSEC) 40 MG capsule TAKE 1 CAPSULE(40 MG) BY MOUTH DAILY 90 capsule 0  . PROAIR RESPICLICK 086 (90 BASE) MCG/ACT AEPB INHALE 2 PUFFS IN TO LUNGS EVERY 4 HOURS AS NEEDED 1 each 11  . traMADol (ULTRAM) 50 MG tablet TAKE 1 TABLET BY MOUTH EVERY 8 HOURS AS NEEDED 50 tablet 0  . Cyanocobalamin (VITAMIN B-12 IJ) Inject as directed every 30 (thirty) days.     No facility-administered medications prior to visit.     ROS Review of Systems  Constitutional: Negative for diaphoresis and fatigue.  HENT: Negative.   Eyes: Negative.   Respiratory: Negative for cough, chest tightness, shortness of breath and wheezing.   Cardiovascular: Negative for chest pain, palpitations and leg swelling.  Gastrointestinal: Negative for abdominal pain, constipation and diarrhea.  Endocrine: Negative.   Genitourinary: Negative.  Negative for difficulty urinating, vaginal bleeding and vaginal discharge.  Musculoskeletal: Negative.   Skin: Negative.  Negative for color change and pallor.  Neurological: Negative.  Negative for dizziness, weakness and light-headedness.    Hematological: Negative for adenopathy. Does not bruise/bleed easily.  Psychiatric/Behavioral: Negative.     Objective:  BP 122/60 (BP Location: Left Arm, Patient Position: Sitting, Cuff Size: Large)   Pulse 65   Temp 97.8 F (36.6 C) (Oral)   Resp 16   Ht 5\' 5"  (1.651 m)   Wt 272 lb (123.4 kg)   SpO2 95%   BMI 45.26 kg/m   BP Readings from Last 3 Encounters:  07/06/18 122/60  02/17/18 124/86  12/17/17 124/80    Wt Readings from Last 3 Encounters:  07/06/18 272 lb (123.4 kg)  02/17/18 266 lb (120.7 kg)  12/17/17 257 lb (116.6 kg)    Physical Exam  Constitutional: She is oriented to person, place, and time. No distress.  HENT:  Mouth/Throat: Oropharynx is clear and moist. No oropharyngeal exudate.  Eyes: Conjunctivae are normal. No scleral icterus.  Neck: Normal range of motion. Neck supple. No JVD present. No thyromegaly present.  Cardiovascular: Normal rate and regular rhythm. Exam reveals no gallop.  Murmur heard.  Systolic murmur is present with a grade of 2/6.  No diastolic murmur is present. 2/6 SEM  Pulmonary/Chest: Effort normal and breath sounds normal. She has no wheezes. She has no rales.  Abdominal: Soft. Normal appearance and bowel sounds are normal. She exhibits no mass. There is no hepatosplenomegaly. There is no tenderness.  Musculoskeletal: Normal range of motion. She exhibits no edema, tenderness or deformity.  Lymphadenopathy:  She has no cervical adenopathy.  Neurological: She is alert and oriented to person, place, and time.  Skin: Skin is warm and dry. She is not diaphoretic. No pallor.  Vitals reviewed.   Lab Results  Component Value Date   WBC 10.5 07/06/2018   HGB 11.9 (L) 07/06/2018   HCT 36.0 07/06/2018   PLT 329.0 07/06/2018   GLUCOSE 89 03/17/2017   CHOL 151 09/01/2017   TRIG 60 04/01/2018   HDL 46 04/01/2018   LDLCALC 107 04/01/2018   ALT 16 04/01/2018   AST 19 04/01/2018   NA 138 04/01/2018   K 4.5 04/01/2018   CL 103  03/17/2017   CREATININE 0.6 04/01/2018   BUN 14 04/01/2018   CO2 25 03/17/2017   TSH 0.73 04/01/2018   HGBA1C 5.6 04/01/2018    Mr Lumbar Spine Wo Contrast  Result Date: 12/28/2017 CLINICAL DATA:  Right lumbar radiculitis EXAM: MRI LUMBAR SPINE WITHOUT CONTRAST TECHNIQUE: Multiplanar, multisequence MR imaging of the lumbar spine was performed. No intravenous contrast was administered. COMPARISON:  None. FINDINGS: Segmentation:  Normal Alignment:  Normal Vertebrae:  Normal Conus medullaris and cauda equina: Conus extends to the L2-3 level. Conus and cauda equina appear normal. Paraspinal and other soft tissues: Negative for mass or adenopathy Disc levels: L1-2: Negative L2-3: Negative L3-4: Negative L4-5: Mild disc bulging and mild facet degeneration without stenosis L5-S1: Negative IMPRESSION: Mild degenerative changes at L4-5 otherwise negative. Negative for disc protrusion or neural impingement. Electronically Signed   By: Franchot Gallo M.D.   On: 12/28/2017 18:59    Assessment & Plan:   Olamide was seen today for anemia.  Diagnoses and all orders for this visit:  Vitamin B12 deficiency anemia due to intrinsic factor deficiency- Her hemoglobin remains at 11.9.  I have asked her to be more compliant with the monthly B12 supplement. -     CBC with Differential/Platelet; Future  Iron deficiency anemia due to chronic blood loss- Will continue iron replacement therapy. -     CBC with Differential/Platelet; Future  Systolic murmur- I have asked her to undergo an echocardiogram to identify the cause for the murmur. -     ECHOCARDIOGRAM COMPLETE; Future  Other orders -     cyanocobalamin (,VITAMIN B-12,) 1000 MCG/ML injection; Inject 1 mL (1,000 mcg total) into the muscle every 30 (thirty) days. -     SYRINGE-NEEDLE, DISP, 3 ML 25G X 5/8" 3 ML MISC; Use to inject B12 monthly   I have discontinued Raymon Mutton. Mcree's Cyanocobalamin (VITAMIN B-12 IJ). I am also having her start on  cyanocobalamin and SYRINGE-NEEDLE (DISP) 3 ML. Additionally, I am having her maintain her Vitamin D, PROAIR RESPICLICK, DULERA, ferrous sulfate, traMADol, meloxicam, omeprazole, and montelukast.  Meds ordered this encounter  Medications  . cyanocobalamin (,VITAMIN B-12,) 1000 MCG/ML injection    Sig: Inject 1 mL (1,000 mcg total) into the muscle every 30 (thirty) days.    Dispense:  3 mL    Refill:  3  . SYRINGE-NEEDLE, DISP, 3 ML 25G X 5/8" 3 ML MISC    Sig: Use to inject B12 monthly    Dispense:  3 each    Refill:  3    Dispense syringe needle available close to requested.     Follow-up: Return in about 6 months (around 01/06/2019).  Scarlette Calico, MD

## 2018-07-07 ENCOUNTER — Encounter: Payer: Self-pay | Admitting: Internal Medicine

## 2018-07-07 DIAGNOSIS — R011 Cardiac murmur, unspecified: Secondary | ICD-10-CM | POA: Insufficient documentation

## 2018-07-17 ENCOUNTER — Encounter: Payer: Self-pay | Admitting: Internal Medicine

## 2018-07-17 ENCOUNTER — Ambulatory Visit (HOSPITAL_COMMUNITY): Payer: BLUE CROSS/BLUE SHIELD | Attending: Internal Medicine

## 2018-07-17 ENCOUNTER — Other Ambulatory Visit: Payer: Self-pay

## 2018-07-17 DIAGNOSIS — R011 Cardiac murmur, unspecified: Secondary | ICD-10-CM | POA: Insufficient documentation

## 2018-07-21 ENCOUNTER — Encounter: Payer: Self-pay | Admitting: Internal Medicine

## 2018-07-27 ENCOUNTER — Telehealth: Payer: Self-pay | Admitting: Internal Medicine

## 2018-07-27 NOTE — Telephone Encounter (Signed)
Copied from [place]. Topic: Quick Communication - See Telephone Encounter >> Jul 27, 2018  3:51 PM Gardiner Ramus wrote: CRM for notification. See Telephone encounter for: 07/27/18. Pt called and stated that she would like echo results. Please advise echo done 07/17/18

## 2018-07-27 NOTE — Telephone Encounter (Signed)
Do you have the results from the ECHO?

## 2018-08-04 ENCOUNTER — Encounter: Payer: Self-pay | Admitting: Internal Medicine

## 2018-08-05 ENCOUNTER — Encounter: Payer: Self-pay | Admitting: Internal Medicine

## 2018-08-05 ENCOUNTER — Telehealth: Payer: Self-pay | Admitting: Radiology

## 2018-08-05 NOTE — Telephone Encounter (Signed)
I called cardiology and they are going to follow up on this and get it sent over to [place].

## 2018-08-05 NOTE — Telephone Encounter (Signed)
° °  Dr Ronnald Ramp office calling for results on echo done 07/17/18. Please advise

## 2018-09-18 DIAGNOSIS — Z6841 Body Mass Index (BMI) 40.0 and over, adult: Secondary | ICD-10-CM | POA: Diagnosis not present

## 2018-09-18 DIAGNOSIS — Z1231 Encounter for screening mammogram for malignant neoplasm of breast: Secondary | ICD-10-CM | POA: Diagnosis not present

## 2018-09-18 DIAGNOSIS — Z01419 Encounter for gynecological examination (general) (routine) without abnormal findings: Secondary | ICD-10-CM | POA: Diagnosis not present

## 2018-09-21 LAB — HM PAP SMEAR

## 2018-09-25 ENCOUNTER — Other Ambulatory Visit: Payer: Self-pay | Admitting: Internal Medicine

## 2018-09-25 DIAGNOSIS — K219 Gastro-esophageal reflux disease without esophagitis: Secondary | ICD-10-CM

## 2018-11-13 ENCOUNTER — Other Ambulatory Visit: Payer: Self-pay | Admitting: Internal Medicine

## 2018-11-13 DIAGNOSIS — D500 Iron deficiency anemia secondary to blood loss (chronic): Secondary | ICD-10-CM

## 2019-01-05 LAB — HM COLONOSCOPY

## 2019-01-06 ENCOUNTER — Telehealth: Payer: Self-pay

## 2019-01-06 NOTE — Telephone Encounter (Signed)
Fax received from [facility] to fill medications (naproxen, omeprazole and b12).   Refill has been denied. Pt is overdue for follow up. I have sent a refill request to pt informing of same.   Fax sent back to [facility] denying refills.   Pt should schedule in the next few weeks for follow up.

## 2019-01-28 ENCOUNTER — Other Ambulatory Visit: Payer: Self-pay | Admitting: Internal Medicine

## 2019-01-28 DIAGNOSIS — N3281 Overactive bladder: Secondary | ICD-10-CM

## 2019-01-31 DIAGNOSIS — N390 Urinary tract infection, site not specified: Secondary | ICD-10-CM | POA: Diagnosis not present

## 2019-01-31 DIAGNOSIS — R300 Dysuria: Secondary | ICD-10-CM | POA: Diagnosis not present

## 2019-02-01 ENCOUNTER — Other Ambulatory Visit (INDEPENDENT_AMBULATORY_CARE_PROVIDER_SITE_OTHER): Payer: BLUE CROSS/BLUE SHIELD

## 2019-02-01 ENCOUNTER — Ambulatory Visit: Payer: BLUE CROSS/BLUE SHIELD | Admitting: Internal Medicine

## 2019-02-01 ENCOUNTER — Encounter: Payer: Self-pay | Admitting: Internal Medicine

## 2019-02-01 VITALS — BP 126/68 | HR 84 | Temp 98.3°F | Wt 269.0 lb

## 2019-02-01 DIAGNOSIS — M5416 Radiculopathy, lumbar region: Secondary | ICD-10-CM

## 2019-02-01 DIAGNOSIS — J4530 Mild persistent asthma, uncomplicated: Secondary | ICD-10-CM

## 2019-02-01 DIAGNOSIS — J4540 Moderate persistent asthma, uncomplicated: Secondary | ICD-10-CM | POA: Diagnosis not present

## 2019-02-01 DIAGNOSIS — D500 Iron deficiency anemia secondary to blood loss (chronic): Secondary | ICD-10-CM

## 2019-02-01 DIAGNOSIS — K219 Gastro-esophageal reflux disease without esophagitis: Secondary | ICD-10-CM

## 2019-02-01 DIAGNOSIS — D510 Vitamin B12 deficiency anemia due to intrinsic factor deficiency: Secondary | ICD-10-CM

## 2019-02-01 LAB — CBC WITH DIFFERENTIAL/PLATELET
Basophils Absolute: 0.2 10*3/uL — ABNORMAL HIGH (ref 0.0–0.1)
Basophils Relative: 1.4 % (ref 0.0–3.0)
Eosinophils Absolute: 0 10*3/uL (ref 0.0–0.7)
Eosinophils Relative: 0.1 % (ref 0.0–5.0)
HCT: 36.5 % (ref 36.0–46.0)
Hemoglobin: 11.8 g/dL — ABNORMAL LOW (ref 12.0–15.0)
Lymphocytes Relative: 27 % (ref 12.0–46.0)
Lymphs Abs: 3 10*3/uL (ref 0.7–4.0)
MCHC: 32.3 g/dL (ref 30.0–36.0)
MCV: 84.9 fl (ref 78.0–100.0)
Monocytes Absolute: 0.8 10*3/uL (ref 0.1–1.0)
Monocytes Relative: 7.1 % (ref 3.0–12.0)
Neutro Abs: 7.1 10*3/uL (ref 1.4–7.7)
Neutrophils Relative %: 64.4 % (ref 43.0–77.0)
Platelets: 347 10*3/uL (ref 150.0–400.0)
RBC: 4.3 Mil/uL (ref 3.87–5.11)
RDW: 15.7 % — ABNORMAL HIGH (ref 11.5–15.5)
WBC: 11 10*3/uL — ABNORMAL HIGH (ref 4.0–10.5)

## 2019-02-01 LAB — IBC PANEL
Iron: 94 ug/dL (ref 42–145)
Saturation Ratios: 33.1 % (ref 20.0–50.0)
Transferrin: 203 mg/dL — ABNORMAL LOW (ref 212.0–360.0)

## 2019-02-01 LAB — FERRITIN: Ferritin: 280.9 ng/mL (ref 10.0–291.0)

## 2019-02-01 MED ORDER — VITAMIN D 50 MCG (2000 UT) PO CAPS: 1.0000 | ORAL_CAPSULE | Freq: Every day | ORAL | 5 refills | Status: AC

## 2019-02-01 MED ORDER — MOMETASONE FURO-FORMOTEROL FUM 200-5 MCG/ACT IN AERO: 2.0000 | INHALATION_SPRAY | Freq: Two times a day (BID) | RESPIRATORY_TRACT | 11 refills | Status: DC

## 2019-02-01 MED ORDER — ALBUTEROL SULFATE 108 (90 BASE) MCG/ACT IN AEPB: 1.0000 | INHALATION_SPRAY | Freq: Four times a day (QID) | RESPIRATORY_TRACT | 11 refills | Status: DC | PRN

## 2019-02-01 MED ORDER — MONTELUKAST SODIUM 10 MG PO TABS: 10.0000 mg | ORAL_TABLET | Freq: Every day | ORAL | 1 refills | Status: DC

## 2019-02-01 NOTE — Progress Notes (Signed)
Subjective:  Patient ID: Gail Payne, female    DOB: [birth year]  Age: 48 y.o. MRN: [id number]  CC: Anemia and Back Pain   HPI Gail Payne presents for f/up - She complains of chronic, intermittent, nonradiating low back pain.  She wants a refill of tramadol.  She ran out of her asthma medicines a couple of weeks ago.  She has not recently had any asthma symptoms but wants to have her meds on hand in case the springtime allergies get to her in the next few weeks.  She tells me she is compliant with her vitamin supplements and denies any recent episodes of blood loss or paresthesias.  Outpatient Medications Prior to Visit  Medication Sig Dispense Refill  . cyanocobalamin (,VITAMIN B-12,) 1000 MCG/ML injection Inject 1 mL (1,000 mcg total) into the muscle every 30 (thirty) days. 3 mL 3  . SYRINGE-NEEDLE, DISP, 3 ML 25G X 5/8" 3 ML MISC Use to inject B12 monthly 3 each 3  . Cholecalciferol (VITAMIN D) 2000 UNITS CAPS Take by mouth daily.    . DULERA 200-5 MCG/ACT AERO INHALE 2 PUFFS INTO THE LUNGS TWICE DAILY. 13 g 11  . FEROSUL 325 (65 Fe) MG tablet TAKE 1 TABLET BY MOUTH TWICE DAILY WITH A MEAL 180 tablet 0  . omeprazole (PRILOSEC) 40 MG capsule TAKE 1 CAPSULE(40 MG) BY MOUTH DAILY 90 capsule 0  . PROAIR RESPICLICK 956 (90 BASE) MCG/ACT AEPB INHALE 2 PUFFS IN TO LUNGS EVERY 4 HOURS AS NEEDED 1 each 11  . traMADol (ULTRAM) 50 MG tablet TAKE 1 TABLET BY MOUTH EVERY 8 HOURS AS NEEDED 50 tablet 0  . meloxicam (MOBIC) 15 MG tablet TAKE 1 TABLET(15 MG) BY MOUTH DAILY (Patient not taking: Reported on 02/01/2019) 90 tablet 0  . montelukast (SINGULAIR) 10 MG tablet Take 1 tablet (10 mg total) by mouth at bedtime. (Patient not taking: Reported on 02/01/2019) 90 tablet 1  . MYRBETRIQ 50 MG TB24 tablet TAKE 1 TABLET(50 MG) BY MOUTH DAILY (Patient not taking: Reported on 02/01/2019) 90 tablet 1   No facility-administered medications prior to visit.     ROS Review of Systems  Constitutional:  Positive for fatigue. Negative for appetite change, chills and unexpected weight change.  HENT: Negative.   Eyes: Negative for visual disturbance.  Respiratory: Negative for cough, chest tightness, shortness of breath and wheezing.   Cardiovascular: Negative for chest pain, palpitations and leg swelling.  Gastrointestinal: Negative for abdominal pain, blood in stool, constipation, diarrhea, nausea and vomiting.  Genitourinary: Negative.  Negative for difficulty urinating.  Musculoskeletal: Positive for back pain. Negative for arthralgias, myalgias and neck pain.  Skin: Negative.  Negative for color change and pallor.  Neurological: Negative.  Negative for dizziness, weakness, light-headedness, numbness and headaches.  Hematological: Negative for adenopathy. Does not bruise/bleed easily.  Psychiatric/Behavioral: Negative.     Objective:  BP 126/68   Pulse 84   Temp 98.3 F (36.8 C)   Wt 269 lb (122 kg)   SpO2 97%   BMI 44.76 kg/m   BP Readings from Last 3 Encounters:  02/01/19 126/68  07/06/18 122/60  02/17/18 124/86    Wt Readings from Last 3 Encounters:  02/01/19 269 lb (122 kg)  07/06/18 272 lb (123.4 kg)  02/17/18 266 lb (120.7 kg)    Physical Exam Vitals signs reviewed.  Constitutional:      Appearance: She is obese. She is not ill-appearing or diaphoretic.  HENT:     Nose: Nose  normal. No congestion.     Mouth/Throat:     Pharynx: Oropharynx is clear. No oropharyngeal exudate or posterior oropharyngeal erythema.  Eyes:     General: No scleral icterus.    Conjunctiva/sclera: Conjunctivae normal.  Neck:     Musculoskeletal: Normal range of motion and neck supple.  Cardiovascular:     Rate and Rhythm: Normal rate and regular rhythm.     Heart sounds: Murmur present. Systolic murmur present with a grade of 1/6. No diastolic murmur. No gallop.      Comments: 1/6 SEM RUSB Pulmonary:     Effort: Pulmonary effort is normal. No respiratory distress.     Breath  sounds: Normal breath sounds. No stridor. No rhonchi or rales.  Abdominal:     General: Bowel sounds are normal.     Palpations: There is no hepatomegaly or splenomegaly.     Tenderness: There is no abdominal tenderness.  Musculoskeletal: Normal range of motion.        General: No swelling.     Right lower leg: No edema.     Left lower leg: No edema.  Skin:    General: Skin is warm and dry.     Coloration: Skin is not pale.     Findings: No erythema or rash.  Neurological:     General: No focal deficit present.     Mental Status: She is oriented to person, place, and time. Mental status is at baseline.     Motor: No weakness.     Gait: Gait normal.     Deep Tendon Reflexes: Reflexes normal.  Psychiatric:        Mood and Affect: Mood normal.        Behavior: Behavior normal.     Lab Results  Component Value Date   WBC 11.0 (H) 02/01/2019   HGB 11.8 (L) 02/01/2019   HCT 36.5 02/01/2019   PLT 347.0 02/01/2019   GLUCOSE 89 03/17/2017   CHOL 151 09/01/2017   TRIG 60 04/01/2018   HDL 46 04/01/2018   LDLCALC 107 04/01/2018   ALT 16 04/01/2018   AST 19 04/01/2018   NA 138 04/01/2018   K 4.5 04/01/2018   CL 103 03/17/2017   CREATININE 0.6 04/01/2018   BUN 14 04/01/2018   CO2 25 03/17/2017   TSH 0.73 04/01/2018   HGBA1C 5.6 04/01/2018    Mr Lumbar Spine Wo Contrast  Result Date: 12/28/2017 CLINICAL DATA:  Right lumbar radiculitis EXAM: MRI LUMBAR SPINE WITHOUT CONTRAST TECHNIQUE: Multiplanar, multisequence MR imaging of the lumbar spine was performed. No intravenous contrast was administered. COMPARISON:  None. FINDINGS: Segmentation:  Normal Alignment:  Normal Vertebrae:  Normal Conus medullaris and cauda equina: Conus extends to the L2-3 level. Conus and cauda equina appear normal. Paraspinal and other soft tissues: Negative for mass or adenopathy Disc levels: L1-2: Negative L2-3: Negative L3-4: Negative L4-5: Mild disc bulging and mild facet degeneration without stenosis  L5-S1: Negative IMPRESSION: Mild degenerative changes at L4-5 otherwise negative. Negative for disc protrusion or neural impingement. Electronically Signed   By: Franchot Gallo M.D.   On: 12/28/2017 18:59    Assessment & Plan:   Gail Payne was seen today for anemia and back pain.  Diagnoses and all orders for this visit:  Vitamin B12 deficiency anemia due to intrinsic factor deficiency- She will continue the monthly B12 injections.  Iron deficiency anemia due to chronic blood loss- She is still mildly anemic.  I have asked her to continue the current  iron supplement. -     CBC with Differential/Platelet; Future -     IBC panel; Future -     Ferritin; Future -     ferrous sulfate (FEROSUL) 325 (65 FE) MG tablet; TAKE 1 TABLET BY MOUTH TWICE DAILY WITH A MEAL  Moderate persistent asthma without complication -     mometasone-formoterol (DULERA) 200-5 MCG/ACT AERO; Inhale 2 puffs into the lungs 2 (two) times daily. -     Albuterol Sulfate (PROAIR RESPICLICK) 416 (90 Base) MCG/ACT AEPB; Inhale 1 puff into the lungs 4 (four) times daily as needed. -     montelukast (SINGULAIR) 10 MG tablet; Take 1 tablet (10 mg total) by mouth at bedtime.  Mild persistent asthma, uncomplicated -     mometasone-formoterol (DULERA) 200-5 MCG/ACT AERO; Inhale 2 puffs into the lungs 2 (two) times daily. -     Albuterol Sulfate (PROAIR RESPICLICK) 606 (90 Base) MCG/ACT AEPB; Inhale 1 puff into the lungs 4 (four) times daily as needed. -     montelukast (SINGULAIR) 10 MG tablet; Take 1 tablet (10 mg total) by mouth at bedtime.  Right lumbar radiculitis -     traMADol (ULTRAM) 50 MG tablet; Take 1 tablet (50 mg total) by mouth every 8 (eight) hours as needed.  Gastroesophageal reflux disease without esophagitis -     omeprazole (PRILOSEC) 40 MG capsule; TAKE 1 CAPSULE(40 MG) BY MOUTH DAILY  Other orders -     Cholecalciferol (VITAMIN D) 50 MCG (2000 UT) CAPS; Take 1 capsule (2,000 Units total) by mouth daily.   I  have discontinued Anamaria Dusenbury. [facility]hell's meloxicam and MYRBETRIQ. I have changed her DULERA to mometasone-formoterol, PROAIR RESPICLICK to Albuterol Sulfate, and FEROSUL to ferrous sulfate. I have also changed her Vitamin D and traMADol. Additionally, I am having her maintain her cyanocobalamin, SYRINGE-NEEDLE (DISP) 3 ML, montelukast, and omeprazole.  Meds ordered this encounter  Medications  . mometasone-formoterol (DULERA) 200-5 MCG/ACT AERO    Sig: Inhale 2 puffs into the lungs 2 (two) times daily.    Dispense:  13 g    Refill:  11  . Albuterol Sulfate (PROAIR RESPICLICK) 301 (90 Base) MCG/ACT AEPB    Sig: Inhale 1 puff into the lungs 4 (four) times daily as needed.    Dispense:  1 each    Refill:  11  . montelukast (SINGULAIR) 10 MG tablet    Sig: Take 1 tablet (10 mg total) by mouth at bedtime.    Dispense:  90 tablet    Refill:  1  . Cholecalciferol (VITAMIN D) 50 MCG (2000 UT) CAPS    Sig: Take 1 capsule (2,000 Units total) by mouth daily.    Dispense:  30 capsule    Refill:  5  . traMADol (ULTRAM) 50 MG tablet    Sig: Take 1 tablet (50 mg total) by mouth every 8 (eight) hours as needed.    Dispense:  60 tablet    Refill:  2  . ferrous sulfate (FEROSUL) 325 (65 FE) MG tablet    Sig: TAKE 1 TABLET BY MOUTH TWICE DAILY WITH A MEAL    Dispense:  180 tablet    Refill:  1  . omeprazole (PRILOSEC) 40 MG capsule    Sig: TAKE 1 CAPSULE(40 MG) BY MOUTH DAILY    Dispense:  90 capsule    Refill:  1     Follow-up: Return in about 4 months (around 06/02/2019).  Scarlette Calico, MD

## 2019-02-01 NOTE — Patient Instructions (Signed)
Iron Deficiency Anemia, Adult  Iron deficiency anemia is a condition in which the concentration of red blood cells or hemoglobin in the blood is below normal because of too little iron. Hemoglobin is a substance in red blood cells that carries oxygen to the body's tissues. When the concentration of red blood cells or hemoglobin is too low, not enough oxygen reaches these tissues.  Iron deficiency anemia is usually long-lasting (chronic) and it develops over time. It may or may not cause symptoms. It is a common type of anemia.  What are the causes?  This condition may be caused by:   Not enough iron in the diet.   Blood loss caused by bleeding in the intestine.   Blood loss from a gastrointestinal condition like Crohn disease.   Frequent blood draws, such as from blood donation.   Abnormal absorption in the gut.   Heavy menstrual periods in women.   Cancers of the gastrointestinal system, such as colon cancer.  What are the signs or symptoms?  Symptoms of this condition may include:   Fatigue.   Headache.   Pale skin, lips, and nail beds.   Poor appetite.   Weakness.   Shortness of breath.   Dizziness.   Cold hands and feet.   Fast or irregular heartbeat.   Irritability. This is more common in severe anemia.   Rapid breathing. This is more common in severe anemia.  Mild anemia may not cause any symptoms.  How is this diagnosed?  This condition is diagnosed based on:   Your medical history.   A physical exam.   Blood tests.  You may have additional tests to find the underlying cause of your anemia, such as:   Testing for blood in the stool (fecal occult blood test).   A procedure to see inside your colon and rectum (colonoscopy).   A procedure to see inside your esophagus and stomach (endoscopy).   A test in which cells are removed from bone marrow (bone marrow aspiration) or fluid is removed from the bone marrow to be examined (biopsy). This is rarely needed.  How is this treated?  This  condition is treated by correcting the cause of your iron deficiency. Treatment may involve:   Adding iron-rich foods to your diet.   Taking iron supplements. If you are pregnant or breastfeeding, you may need to take extra iron because your normal diet usually does not provide the amount of iron that you need.   Increasing vitamin C intake. Vitamin C helps your body absorb iron. Your health care provider may recommend that you take iron supplements along with a glass of orange juice or a vitamin C supplement.   Medicines to make heavy menstrual flow lighter.   Surgery.  You may need repeat blood tests to determine whether treatment is working. Depending on the underlying cause, the anemia should be corrected within 2 months of starting treatment. If the treatment does not seem to be working, you may need more testing.  Follow these instructions at home:  Medicines   Take over-the-counter and prescription medicines only as told by your health care provider. This includes iron supplements and vitamins.   If you cannot tolerate taking iron supplements by mouth, talk with your health care provider about taking them through a vein (intravenously) or an injection into a muscle.   For the best iron absorption, you should take iron supplements when your stomach is empty. If you cannot tolerate them on an empty stomach,   you may need to take them with food.   Do not drink milk or take antacids at the same time as your iron supplements. Milk and antacids may interfere with iron absorption.   Iron supplements can cause constipation. To prevent constipation, include fiber in your diet as told by your health care provider. A stool softener may also be recommended.  Eating and drinking     Talk with your health care provider before changing your diet. He or she may recommend that you eat foods that contain a lot of iron, such as:  ? Liver.  ? Low-fat (lean) beef.  ? Breads and cereals that have iron added to them (are  fortified).  ? Eggs.  ? Dried fruit.  ? Dark green, leafy vegetables.   To help your body use the iron from iron-rich foods, eat those foods at the same time as fresh fruits and vegetables that are high in vitamin C. Foods that are high in vitamin C include:  ? Oranges.  ? Peppers.  ? Tomatoes.  ? Mangoes.   Drinkenoughfluid to keep your urine clear or pale yellow.  General instructions   Return to your normal activities as told by your health care provider. Ask your health care provider what activities are safe for you.   Practice good hygiene. Anemia can make you more prone to illness and infection.   Keep all follow-up visits as told by your health care provider. This is important.  Contact a health care provider if:   You feel nauseous or you vomit.   You feel weak.   You have unexplained sweating.   You develop symptoms of constipation, such as:  ? Having fewer than three bowel movements a week.  ? Straining to have a bowel movement.  ? Having stools that are hard, dry, or larger than normal.  ? Feeling full or bloated.  ? Pain in the lower abdomen.  ? Not feeling relief after having a bowel movement.  Get help right away if:   You faint. If this happens, do not drive yourself to the hospital. Call your local emergency services (911 in the [place]   You have chest pain.   You have shortness of breath that:  ? Is severe.  ? Gets worse with physical activity.   You have a rapid heartbeat.   You become light-headed when getting up from a sitting or lying down position.  This information is not intended to replace advice given to you by your health care provider. Make sure you discuss any questions you have with your health care provider.  Document Released: 11/22/2000 Document Revised: 08/14/2016 Document Reviewed: 08/14/2016  [employer] Interactive Patient Education  2019 [employer]

## 2019-02-02 ENCOUNTER — Encounter: Payer: Self-pay | Admitting: Internal Medicine

## 2019-02-02 ENCOUNTER — Other Ambulatory Visit: Payer: Self-pay | Admitting: Internal Medicine

## 2019-02-02 DIAGNOSIS — K219 Gastro-esophageal reflux disease without esophagitis: Secondary | ICD-10-CM

## 2019-02-02 DIAGNOSIS — D500 Iron deficiency anemia secondary to blood loss (chronic): Secondary | ICD-10-CM

## 2019-02-02 MED ORDER — TRAMADOL HCL 50 MG PO TABS: 50.0000 mg | ORAL_TABLET | Freq: Three times a day (TID) | ORAL | 2 refills | Status: DC | PRN

## 2019-02-02 MED ORDER — FERROUS SULFATE 325 (65 FE) MG PO TABS: ORAL_TABLET | ORAL | 1 refills | Status: DC

## 2019-02-02 MED ORDER — OMEPRAZOLE 40 MG PO CPDR: DELAYED_RELEASE_CAPSULE | ORAL | 1 refills | Status: DC

## 2019-03-05 DIAGNOSIS — D649 Anemia, unspecified: Secondary | ICD-10-CM | POA: Diagnosis not present

## 2019-03-05 DIAGNOSIS — N920 Excessive and frequent menstruation with regular cycle: Secondary | ICD-10-CM | POA: Diagnosis not present

## 2019-04-07 DIAGNOSIS — A599 Trichomoniasis, unspecified: Secondary | ICD-10-CM | POA: Diagnosis not present

## 2019-04-07 DIAGNOSIS — N760 Acute vaginitis: Secondary | ICD-10-CM | POA: Diagnosis not present

## 2019-04-07 DIAGNOSIS — B373 Candidiasis of vulva and vagina: Secondary | ICD-10-CM | POA: Diagnosis not present

## 2019-05-11 ENCOUNTER — Other Ambulatory Visit: Payer: Self-pay

## 2019-05-11 ENCOUNTER — Encounter: Payer: Self-pay | Admitting: Internal Medicine

## 2019-05-11 ENCOUNTER — Ambulatory Visit (INDEPENDENT_AMBULATORY_CARE_PROVIDER_SITE_OTHER): Payer: BC Managed Care – PPO | Admitting: Internal Medicine

## 2019-05-11 VITALS — BP 122/70 | HR 82 | Temp 98.3°F | Ht 65.0 in | Wt 269.0 lb

## 2019-05-11 DIAGNOSIS — G8929 Other chronic pain: Secondary | ICD-10-CM | POA: Diagnosis not present

## 2019-05-11 DIAGNOSIS — M542 Cervicalgia: Secondary | ICD-10-CM

## 2019-05-11 DIAGNOSIS — M25511 Pain in right shoulder: Secondary | ICD-10-CM | POA: Diagnosis not present

## 2019-05-11 MED ORDER — ETODOLAC ER 400 MG PO TB24: 400.0000 mg | ORAL_TABLET | Freq: Every day | ORAL | 0 refills | Status: DC

## 2019-05-11 NOTE — Patient Instructions (Signed)

## 2019-05-11 NOTE — Progress Notes (Signed)
Subjective:  Patient ID: Gail Payne, female    DOB: 1971-09-24  Age: 48 y.o. MRN: [id number]  CC: Shoulder Pain (Right x 1 month. No known injury)   HPI SAI MOURA presents for f/up - She complains of a one-month history of vague discomfort in her right neck, right trapezius, and right shoulder.  She denies any trauma or injury.  She has also noticed a slight decrease in the range of motion in her right shoulder.  She says the discomfort radiates into her right upper extremity but she denies paresthesias in her arms.  She is not taking anything for the discomfort.  Outpatient Medications Prior to Visit  Medication Sig Dispense Refill  . Albuterol Sulfate (PROAIR RESPICLICK) 761 (90 Base) MCG/ACT AEPB Inhale 1 puff into the lungs 4 (four) times daily as needed. 1 each 11  . Cholecalciferol (VITAMIN D) 50 MCG (2000 UT) CAPS Take 1 capsule (2,000 Units total) by mouth daily. 30 capsule 5  . cyanocobalamin (,VITAMIN B-12,) 1000 MCG/ML injection Inject 1 mL (1,000 mcg total) into the muscle every 30 (thirty) days. 3 mL 3  . ferrous sulfate (FEROSUL) 325 (65 FE) MG tablet TAKE 1 TABLET BY MOUTH TWICE DAILY WITH A MEAL 180 tablet 1  . mometasone-formoterol (DULERA) 200-5 MCG/ACT AERO Inhale 2 puffs into the lungs 2 (two) times daily. 13 g 11  . montelukast (SINGULAIR) 10 MG tablet Take 1 tablet (10 mg total) by mouth at bedtime. 90 tablet 1  . omeprazole (PRILOSEC) 40 MG capsule TAKE 1 CAPSULE(40 MG) BY MOUTH DAILY 90 capsule 1  . SYRINGE-NEEDLE, DISP, 3 ML 25G X 5/8" 3 ML MISC Use to inject B12 monthly 3 each 3  . traMADol (ULTRAM) 50 MG tablet Take 1 tablet (50 mg total) by mouth every 8 (eight) hours as needed. 60 tablet 2   No facility-administered medications prior to visit.     ROS Review of Systems  Constitutional: Negative.  Negative for diaphoresis, fatigue and fever.  HENT: Negative.   Eyes: Negative for visual disturbance.  Respiratory: Negative for cough, chest  tightness, shortness of breath and wheezing.   Cardiovascular: Negative for chest pain, palpitations and leg swelling.  Gastrointestinal: Negative for abdominal pain, constipation, diarrhea, nausea and vomiting.  Genitourinary: Negative.  Negative for difficulty urinating.  Musculoskeletal: Positive for arthralgias and neck pain.  Skin: Negative.   Neurological: Negative for dizziness, tremors, weakness, light-headedness, numbness and headaches.  Hematological: Negative for adenopathy. Does not bruise/bleed easily.  Psychiatric/Behavioral: Negative.     Objective:  BP 122/70 (BP Location: Left Arm, Patient Position: Sitting, Cuff Size: Large)   Pulse 82   Temp 98.3 F (36.8 C) (Oral)   Ht 5\' 5"  (1.651 m)   Wt 269 lb (122 kg)   LMP 04/13/2019   SpO2 96%   BMI 44.76 kg/m   BP Readings from Last 3 Encounters:  05/11/19 122/70  02/01/19 126/68  07/06/18 122/60    Wt Readings from Last 3 Encounters:  05/11/19 269 lb (122 kg)  02/01/19 269 lb (122 kg)  07/06/18 272 lb (123.4 kg)    Physical Exam Vitals signs reviewed.  Constitutional:      Appearance: She is obese.  HENT:     Nose: Nose normal.     Mouth/Throat:     Mouth: Mucous membranes are moist.  Eyes:     General: No scleral icterus.    Conjunctiva/sclera: Conjunctivae normal.  Neck:     Musculoskeletal: Normal range of  motion and neck supple. No neck rigidity or muscular tenderness.  Cardiovascular:     Rate and Rhythm: Normal rate and regular rhythm.     Heart sounds: No murmur.  Pulmonary:     Breath sounds: No stridor. No wheezing, rhonchi or rales.  Abdominal:     General: Abdomen is protuberant. Bowel sounds are normal.     Palpations: Abdomen is soft.     Tenderness: There is no abdominal tenderness.  Musculoskeletal: Normal range of motion.     Right shoulder: She exhibits tenderness. She exhibits no bony tenderness, no swelling, no effusion, no crepitus, no deformity and no spasm.     Cervical back:  She exhibits normal range of motion, no tenderness, no bony tenderness, no edema, no deformity, no pain and no spasm.  Skin:    General: Skin is warm and dry.  Neurological:     General: No focal deficit present.     Mental Status: Mental status is at baseline.     Cranial Nerves: Cranial nerves are intact.     Sensory: Sensation is intact.     Motor: Motor function is intact. No weakness or tremor.     Coordination: Coordination is intact.     Deep Tendon Reflexes: Reflexes normal.     Reflex Scores:      Tricep reflexes are 0 on the right side and 0 on the left side.      Bicep reflexes are 0 on the right side and 0 on the left side.      Brachioradialis reflexes are 0 on the right side and 0 on the left side.      Patellar reflexes are 0 on the right side and 0 on the left side.      Achilles reflexes are 0 on the right side and 0 on the left side. Psychiatric:        Mood and Affect: Mood normal.        Behavior: Behavior normal.     Lab Results  Component Value Date   WBC 11.0 (H) 02/01/2019   HGB 11.8 (L) 02/01/2019   HCT 36.5 02/01/2019   PLT 347.0 02/01/2019   GLUCOSE 89 03/17/2017   CHOL 151 09/01/2017   TRIG 60 04/01/2018   HDL 46 04/01/2018   LDLCALC 107 04/01/2018   ALT 16 04/01/2018   AST 19 04/01/2018   NA 138 04/01/2018   K 4.5 04/01/2018   CL 103 03/17/2017   CREATININE 0.6 04/01/2018   BUN 14 04/01/2018   CO2 25 03/17/2017   TSH 0.73 04/01/2018   HGBA1C 5.6 04/01/2018    Mr Lumbar Spine Wo Contrast  Result Date: 12/28/2017 CLINICAL DATA:  Right lumbar radiculitis EXAM: MRI LUMBAR SPINE WITHOUT CONTRAST TECHNIQUE: Multiplanar, multisequence MR imaging of the lumbar spine was performed. No intravenous contrast was administered. COMPARISON:  None. FINDINGS: Segmentation:  Normal Alignment:  Normal Vertebrae:  Normal Conus medullaris and cauda equina: Conus extends to the L2-3 level. Conus and cauda equina appear normal. Paraspinal and other soft tissues:  Negative for mass or adenopathy Disc levels: L1-2: Negative L2-3: Negative L3-4: Negative L4-5: Mild disc bulging and mild facet degeneration without stenosis L5-S1: Negative IMPRESSION: Mild degenerative changes at L4-5 otherwise negative. Negative for disc protrusion or neural impingement. Electronically Signed   By: Franchot Gallo M.D.   On: 12/28/2017 18:59    Dg Cervical Spine Complete  Result Date: 05/13/2019 CLINICAL DATA:  RIGHT shoulder and neck pain for 3-4  weeks EXAM: CERVICAL SPINE - COMPLETE 4+ VIEW COMPARISON:  None FINDINGS: Straightening of cervical lordosis question muscle spasm. Prevertebral soft tissues normal thickness. Osseous mineralization normal. Vertebral body and disc space heights maintained. No fracture, subluxation, or bone destruction. Lung apices clear. IMPRESSION: Question muscle spasm; otherwise negative exam Electronically Signed   By: Lavonia Dana M.D.   On: 05/13/2019 08:17   Dg Shoulder Right  Result Date: 05/13/2019 CLINICAL DATA:  RIGHT shoulder and neck pain for 3-4 weeks, pain and decreased range of motion EXAM: RIGHT SHOULDER - 2+ VIEW COMPARISON:  None FINDINGS: Osseous mineralization normal. Mild degenerative changes of the [facility] joint with inferior spur formation. No fracture, dislocation, or bone destruction. Visualized RIGHT ribs unremarkable. IMPRESSION: Mild degenerative changes of the RIGHT AC joint. Electronically Signed   By: Lavonia Dana M.D.   On: 05/13/2019 08:18    Assessment & Plan:   Azizi was seen today for shoulder pain.  Diagnoses and all orders for this visit:  Chronic pain in right shoulder- Her plain film shows osteoarthritis in the [facility] joint.  I have asked her to take an anti-inflammatory to treat this and to try to improve the range of motion in her right shoulder.  If her symptoms do not improve then I will consider referring her to an orthopedist. -     DG Shoulder Right; Future -     etodolac (LODINE XL) 400 MG 24 hr tablet; Take 1  tablet (400 mg total) by mouth daily.  Neck pain on right side- Plain films of the cervical spine are only remarkable for possible muscle spasm.  There is no degenerative disc disease or spurring.  I have asked her to start taking an anti-inflammatory and to use a muscle relaxer as needed for the discomfort.  If her symptoms do not improve soon then I may consider ordering an MRI of her cervical spine to see if she has a disc herniation, nerve impingement, or tumor. -     DG Cervical Spine Complete; Future -     etodolac (LODINE XL) 400 MG 24 hr tablet; Take 1 tablet (400 mg total) by mouth daily. -     cyclobenzaprine (FLEXERIL) 5 MG tablet; Take 1 tablet (5 mg total) by mouth 3 (three) times daily as needed for muscle spasms.   I am having Gail Payne start on etodolac and cyclobenzaprine. I am also having her maintain her cyanocobalamin, SYRINGE-NEEDLE (DISP) 3 ML, mometasone-formoterol, Albuterol Sulfate, montelukast, Vitamin D, traMADol, ferrous sulfate, and omeprazole.  Meds ordered this encounter  Medications  . etodolac (LODINE XL) 400 MG 24 hr tablet    Sig: Take 1 tablet (400 mg total) by mouth daily.    Dispense:  90 tablet    Refill:  0  . cyclobenzaprine (FLEXERIL) 5 MG tablet    Sig: Take 1 tablet (5 mg total) by mouth 3 (three) times daily as needed for muscle spasms.    Dispense:  60 tablet    Refill:  1     Follow-up: Return in about 2 months (around 07/11/2019).  Scarlette Calico, MD

## 2019-05-12 ENCOUNTER — Other Ambulatory Visit: Payer: Self-pay

## 2019-05-12 ENCOUNTER — Ambulatory Visit (INDEPENDENT_AMBULATORY_CARE_PROVIDER_SITE_OTHER)
Admission: RE | Admit: 2019-05-12 | Discharge: 2019-05-12 | Disposition: A | Discharge status: Home / Self Care | Payer: BC Managed Care – PPO | Source: Ambulatory Visit | Attending: Internal Medicine | Admitting: Internal Medicine

## 2019-05-12 DIAGNOSIS — M542 Cervicalgia: Secondary | ICD-10-CM | POA: Diagnosis not present

## 2019-05-12 DIAGNOSIS — M25511 Pain in right shoulder: Secondary | ICD-10-CM | POA: Diagnosis not present

## 2019-05-12 DIAGNOSIS — M19011 Primary osteoarthritis, right shoulder: Secondary | ICD-10-CM | POA: Diagnosis not present

## 2019-05-12 DIAGNOSIS — G8929 Other chronic pain: Secondary | ICD-10-CM

## 2019-05-13 ENCOUNTER — Encounter: Payer: Self-pay | Admitting: Internal Medicine

## 2019-05-13 MED ORDER — CYCLOBENZAPRINE HCL 5 MG PO TABS: 5.0000 mg | ORAL_TABLET | Freq: Three times a day (TID) | ORAL | 1 refills | Status: DC | PRN

## 2019-06-21 ENCOUNTER — Other Ambulatory Visit: Payer: Self-pay | Admitting: Internal Medicine

## 2019-06-22 ENCOUNTER — Other Ambulatory Visit: Payer: Self-pay | Admitting: Internal Medicine

## 2019-07-27 ENCOUNTER — Encounter: Payer: Self-pay | Admitting: Internal Medicine

## 2019-07-28 ENCOUNTER — Other Ambulatory Visit: Payer: Self-pay | Admitting: Internal Medicine

## 2019-07-28 DIAGNOSIS — G8929 Other chronic pain: Secondary | ICD-10-CM

## 2019-08-03 ENCOUNTER — Encounter: Payer: Self-pay | Admitting: Internal Medicine

## 2019-08-08 ENCOUNTER — Other Ambulatory Visit: Payer: Self-pay | Admitting: Internal Medicine

## 2019-08-12 ENCOUNTER — Other Ambulatory Visit: Payer: Self-pay | Admitting: Internal Medicine

## 2019-08-12 DIAGNOSIS — G8929 Other chronic pain: Secondary | ICD-10-CM

## 2019-08-12 DIAGNOSIS — M542 Cervicalgia: Secondary | ICD-10-CM

## 2019-08-12 DIAGNOSIS — M25511 Pain in right shoulder: Secondary | ICD-10-CM

## 2019-08-12 MED ORDER — ETODOLAC ER 400 MG PO TB24: 400.0000 mg | ORAL_TABLET | Freq: Every day | ORAL | 0 refills | Status: DC

## 2019-08-18 ENCOUNTER — Other Ambulatory Visit: Payer: Self-pay | Admitting: Internal Medicine

## 2019-08-23 ENCOUNTER — Encounter: Payer: Self-pay | Admitting: Family Medicine

## 2019-08-23 ENCOUNTER — Other Ambulatory Visit: Payer: Self-pay

## 2019-08-23 ENCOUNTER — Other Ambulatory Visit: Payer: Self-pay | Admitting: Internal Medicine

## 2019-08-23 ENCOUNTER — Ambulatory Visit (INDEPENDENT_AMBULATORY_CARE_PROVIDER_SITE_OTHER): Payer: BC Managed Care – PPO | Admitting: Family Medicine

## 2019-08-23 ENCOUNTER — Ambulatory Visit: Payer: Self-pay

## 2019-08-23 VITALS — BP 132/90 | HR 62 | Ht 65.0 in | Wt 263.0 lb

## 2019-08-23 DIAGNOSIS — M7551 Bursitis of right shoulder: Secondary | ICD-10-CM | POA: Diagnosis not present

## 2019-08-23 DIAGNOSIS — M542 Cervicalgia: Secondary | ICD-10-CM

## 2019-08-23 DIAGNOSIS — G8929 Other chronic pain: Secondary | ICD-10-CM | POA: Diagnosis not present

## 2019-08-23 DIAGNOSIS — M25511 Pain in right shoulder: Secondary | ICD-10-CM

## 2019-08-23 MED ORDER — ETODOLAC ER 400 MG PO TB24: 400.0000 mg | ORAL_TABLET | Freq: Every day | ORAL | 0 refills | Status: DC

## 2019-08-23 MED ORDER — DICLOFENAC SODIUM 2 % TD SOLN: 2.0000 g | Freq: Two times a day (BID) | TRANSDERMAL | 3 refills | Status: DC

## 2019-08-23 NOTE — Progress Notes (Signed)
[facility] [address] [place], [place] [address] Phone: [phone] Subjective:   I Gail Payne am serving as a Education administrator for Dr. Hulan Saas.  I'm seeing this patient by the request  of:  Gail Lima, MD   CC: Right shoulder and right neck pain  [id number]  Gail Payne is a 48 y.o. female coming in with complaint of right shoulder pain. Pain radiates into the right side of the neck. Shoulder pops and she has some weakness. Lifting is difficult. Numbness and tingling in the finger tips. History of carpal tunnel.   Onset- Chronic  Location - [place]  Duration-  Character- throbbing  Aggravating factors- reaching Reliving factors-  Therapies tried- medication for muscle spasm  Severity- 7/10 at its worse 7-8/10     Past Medical History:  Diagnosis Date  . Anemia    iron deficiency  . Asthma   . Fibroids    uterine  . GERD (gastroesophageal reflux disease)   . Heart murmur    discovered during labor and delivery 5 yrs ago   Past Surgical History:  Procedure Laterality Date  . APPENDECTOMY    . CESAREAN SECTION     x1  . TUBAL LIGATION     Social History   Socioeconomic History  . Marital status: Divorced    Spouse name: Not on file  . Number of children: 1  . Years of education: Not on file  . Highest education level: Not on file  Occupational History  . Not on file  Social Needs  . Financial resource strain: Not on file  . Food insecurity    Worry: Not on file    Inability: Not on file  . Transportation needs    Medical: Not on file    Non-medical: Not on file  Tobacco Use  . Smoking status: Never Smoker  . Smokeless tobacco: Never Used  Substance and Sexual Activity  . Alcohol use: No    Alcohol/week: 0.0 standard drinks  . Drug use: No  . Sexual activity: Yes    Birth control/protection: Surgical, Pill  Lifestyle  . Physical activity    Days per week: Not on file    Minutes per session: Not on  file  . Stress: Not on file  Relationships  . Social Herbalist on phone: Not on file    Gets together: Not on file    Attends religious service: Not on file    Active member of club or organization: Not on file    Attends meetings of clubs or organizations: Not on file    Relationship status: Not on file  Other Topics Concern  . Not on file  Social History Narrative   Regular Exercise- no   Allergies  Allergen Reactions  . Latex    Family History  Problem Relation Age of Onset  . Arthritis Other   . Diabetes Other   . Hyperlipidemia Other   . Hypertension Other   . Learning disabilities Maternal Aunt   . Heart disease Father   . Hypertension Mother   . Asthma Father       Current Outpatient Medications (Respiratory):  Marland Kitchen  Albuterol Sulfate (PROAIR RESPICLICK) 123XX123 (90 Base) MCG/ACT AEPB, Inhale 1 puff into the lungs 4 (four) times daily as needed. .  mometasone-formoterol (DULERA) 200-5 MCG/ACT AERO, Inhale 2 puffs into the lungs 2 (two) times daily. .  montelukast (SINGULAIR) 10 MG tablet, Take 1 tablet (  10 mg total) by mouth at bedtime.  Current Outpatient Medications (Analgesics):  .  etodolac (LODINE XL) 400 MG 24 hr tablet, Take 1 tablet (400 mg total) by mouth daily. .  traMADol (ULTRAM) 50 MG tablet, Take 1 tablet (50 mg total) by mouth every 8 (eight) hours as needed.  Current Outpatient Medications (Hematological):  .  cyanocobalamin (,VITAMIN B-12,) 1000 MCG/ML injection, ADMINISTER 1 ML(1000 MCG) IN THE MUSCLE EVERY 30 DAYS .  ferrous sulfate (FEROSUL) 325 (65 FE) MG tablet, TAKE 1 TABLET BY MOUTH TWICE DAILY WITH A MEAL  Current Outpatient Medications (Other):  Marland Kitchen  Cholecalciferol (VITAMIN D) 50 MCG (2000 UT) CAPS, Take 1 capsule (2,000 Units total) by mouth daily. .  cyclobenzaprine (FLEXERIL) 5 MG tablet, Take 1 tablet (5 mg total) by mouth 3 (three) times daily as needed for muscle spasms. Marland Kitchen  omeprazole (PRILOSEC) 40 MG capsule, TAKE 1  CAPSULE(40 MG) BY MOUTH DAILY .  SYRINGE-NEEDLE, DISP, 3 ML (B-D 3CC LUER-LOK SYR 25GX5/8") 25G X 5/8" 3 ML MISC, USE TO INJECT B12 MONTHLY    Past medical history, social, surgical and family history all reviewed in electronic medical record.  No pertanent information unless stated regarding to the chief complaint.   Review of Systems:  No headache, visual changes, nausea, vomiting, diarrhea, constipation, dizziness, abdominal pain, skin rash, fevers, chills, night sweats, weight loss, swollen lymph nodes, body aches, joint swelling, muscle aches, chest pain, shortness of breath, mood changes.   Objective  There were no vitals taken for this visit. Systems examined below as of    General: No apparent distress alert and oriented x3 mood and affect normal, dressed appropriately.  HEENT: Pupils equal, extraocular movements intact  Respiratory: Patient's speak in full sentences and does not appear short of breath  Cardiovascular: No lower extremity edema, non tender, no erythema  Skin: Warm dry intact with no signs of infection or rash on extremities or on axial skeleton.  Abdomen: Soft nontender  Neuro: Cranial nerves II through XII are intact, neurovascularly intact in all extremities with 2+ DTRs and 2+ pulses.  Lymph: No lymphadenopathy of posterior or anterior cervical chain or axillae bilaterally.  Gait normal with good balance and coordination.  MSK:  Non tender with full range of motion and good stability and symmetric strength and tone of  elbows, wrist, hip, knee and ankles bilaterally.  Shoulder: Right Inspection reveals no abnormalities, atrophy or asymmetry. Palpation is normal with no tenderness over AC joint or bicipital groove. ROM is full in all planes passively. Rotator cuff strength normal throughout. signs of impingement with positive Neer and Hawkin's tests, but negative empty can sign. Speeds and Yergason's tests normal. No labral pathology noted with negative  Obrien's, negative clunk and good stability. Normal scapular function observed. No painful arc and no drop arm sign. No apprehension sign  MSK US performed of: Right This study was ordered, performed, and interpreted by Charlann Boxer D.O.  Shoulder:   Supraspinatus:  Appears normal on long and transverse views, Bursal bulge seen with shoulder abduction on impingement view. Infraspinatus:  Appears normal on long and transverse views. Significant increase in Doppler flow Subscapularis:  Appears normal on long and transverse views. Positive bursa Teres Minor:  Appears normal on long and transverse views. AC joint:  Capsule undistended, no geyser sign. Glenohumeral Joint:  Appears normal without effusion. Glenoid Labrum:  Intact without visualized tears. Biceps Tendon:  Appears normal on long and transverse views, no fraying of tendon, tendon located  in intertubercular groove, no subluxation with shoulder internal or external rotation.  Impression: Subacromial bursitis calcific  Procedure: Real-time Ultrasound Guided Injection of right glenohumeral joint Device: GE Logiq E  Ultrasound guided injection is preferred based studies that show increased duration, increased effect, greater accuracy, decreased procedural pain, increased response rate with ultrasound guided versus blind injection.  Verbal informed consent obtained.  Time-out conducted.  Noted no overlying erythema, induration, or other signs of local infection.  Skin prepped in a sterile fashion.  Local anesthesia: Topical Ethyl chloride.  With sterile technique and under real time ultrasound guidance:  Joint visualized.  23g 1  inch needle inserted posterior approach. Pictures taken for needle placement. Patient did have injection of 2 cc of 1% lidocaine, 2 cc of 0.5% Marcaine, and 1.0 cc of Kenalog 40 mg/dL. Completed without difficulty  Pain immediately resolved suggesting accurate placement of the medication.  Advised to call if  fevers/chills, erythema, induration, drainage, or persistent bleeding.  Images permanently stored and available for review in the ultrasound unit.  Impression: Technically successful ultrasound guided injection.  [id number]; 15 additional minutes spent for Therapeutic exercises as stated in above notes.  This included exercises focusing on stretching, strengthening, with significant focus on eccentric aspects.   Long term goals include an improvement in range of motion, strength, endurance as well as avoiding reinjury. Patient's frequency would include in 1-2 times a day, 3-5 times a week for a duration of 6-12 weeks. Shoulder Exercises that included:  Basic scapular stabilization to include adduction and depression of scapula Scaption, focusing on proper movement and good control Internal and External rotation utilizing a theraband, with elbow tucked at side entire time Rows with theraband   Proper technique shown and discussed handout in great detail with ATC.  All questions were discussed and answered.     Impression and Recommendations:     This case required medical decision making of moderate complexity. The above documentation has been reviewed and is accurate and complete Lyndal Pulley, DO       Note: This dictation was prepared with Dragon dictation along with smaller phrase technology. Any transcriptional errors that result from this process are unintentional.

## 2019-08-23 NOTE — Patient Instructions (Addendum)
Good to see you.  Ice 20 minutes 2 times daily. Usually after activity and before bed. Exercises 3 times a week.  pennsaid pinkie amount topically 2 times daily as needed.  Vitamin D 2000 IU daily  See me again in 4-6 weeks  

## 2019-08-23 NOTE — Assessment & Plan Note (Signed)
Today.  Discussed icing regimen and home exercise.  Discussed which activities of doing which also avoid.  Discussed topical anti-inflammatories which was prescribed.  Potential side effects.  Continue to have pain consider x-rays and physical therapy but I believe patient will do well with conservative therapy.

## 2019-09-16 ENCOUNTER — Other Ambulatory Visit: Payer: Self-pay | Admitting: Internal Medicine

## 2019-09-16 DIAGNOSIS — K219 Gastro-esophageal reflux disease without esophagitis: Secondary | ICD-10-CM

## 2019-09-16 MED ORDER — OMEPRAZOLE 40 MG PO CPDR: DELAYED_RELEASE_CAPSULE | ORAL | 1 refills | Status: DC

## 2019-09-22 ENCOUNTER — Other Ambulatory Visit: Payer: Self-pay | Admitting: Internal Medicine

## 2019-09-28 ENCOUNTER — Ambulatory Visit: Payer: BC Managed Care – PPO | Admitting: Family Medicine

## 2019-09-28 ENCOUNTER — Other Ambulatory Visit: Payer: Self-pay

## 2019-09-28 ENCOUNTER — Encounter: Payer: Self-pay | Admitting: Family Medicine

## 2019-09-28 DIAGNOSIS — M7551 Bursitis of right shoulder: Secondary | ICD-10-CM

## 2019-09-28 NOTE — Assessment & Plan Note (Signed)
100% better after the injection at this time.  Patient is having no significant pain at the moment.  Encourage patient to increase activity as tolerated.  Follow-up again in 4 to 8 weeks if worsening pain otherwise as needed

## 2019-09-28 NOTE — Progress Notes (Signed)
[facility] [address], [place] [address] Phone: [phone] Subjective:   Fontaine No, am serving as a scribe for Dr. Hulan Saas.   CC: Shoulder pain follow-up  [id number]   08/23/2019  Today.  Discussed icing regimen and home exercise.  Discussed which activities of doing which also avoid.  Discussed topical anti-inflammatories which was prescribed.  Potential side effects.  Continue to have pain consider x-rays and physical therapy but I believe patient will do well with conservative therapy.  Update 09/28/2019 Gail Payne is a 48 y.o. female coming in with complaint of right shoulder pain. Patient states that she has not had any pain since last visit. Continues to do home exercise program. Is using Vitamin D.    Patient was seen previously and had more of a calcific bursitis seen August 23, 2019 and given an injection at that time  Past Medical History:  Diagnosis Date  . Anemia    iron deficiency  . Asthma   . Fibroids    uterine  . GERD (gastroesophageal reflux disease)   . Heart murmur    discovered during labor and delivery 5 yrs ago   Past Surgical History:  Procedure Laterality Date  . APPENDECTOMY    . CESAREAN SECTION     x1  . TUBAL LIGATION     Social History   Socioeconomic History  . Marital status: Divorced    Spouse name: Not on file  . Number of children: 1  . Years of education: Not on file  . Highest education level: Not on file  Occupational History  . Not on file  Social Needs  . Financial resource strain: Not on file  . Food insecurity    Worry: Not on file    Inability: Not on file  . Transportation needs    Medical: Not on file    Non-medical: Not on file  Tobacco Use  . Smoking status: Never Smoker  . Smokeless tobacco: Never Used  Substance and Sexual Activity  . Alcohol use: No    Alcohol/week: 0.0 standard drinks  . Drug use: No  . Sexual activity: Yes    Birth  control/protection: Surgical, Pill  Lifestyle  . Physical activity    Days per week: Not on file    Minutes per session: Not on file  . Stress: Not on file  Relationships  . Social Herbalist on phone: Not on file    Gets together: Not on file    Attends religious service: Not on file    Active member of club or organization: Not on file    Attends meetings of clubs or organizations: Not on file    Relationship status: Not on file  Other Topics Concern  . Not on file  Social History Narrative   Regular Exercise- no   Allergies  Allergen Reactions  . Latex    Family History  Problem Relation Age of Onset  . Arthritis Other   . Diabetes Other   . Hyperlipidemia Other   . Hypertension Other   . Learning disabilities Maternal Aunt   . Heart disease Father   . Hypertension Mother   . Asthma Father       Current Outpatient Medications (Respiratory):  Marland Kitchen  Albuterol Sulfate (PROAIR RESPICLICK) 123XX123 (90 Base) MCG/ACT AEPB, Inhale 1 puff into the lungs 4 (four) times daily as needed. .  mometasone-formoterol (DULERA) 200-5 MCG/ACT AERO, Inhale 2 puffs  into the lungs 2 (two) times daily. .  montelukast (SINGULAIR) 10 MG tablet, Take 1 tablet (10 mg total) by mouth at bedtime.  Current Outpatient Medications (Analgesics):  .  etodolac (LODINE XL) 400 MG 24 hr tablet, Take 1 tablet (400 mg total) by mouth daily.  Current Outpatient Medications (Hematological):  .  cyanocobalamin (,VITAMIN B-12,) 1000 MCG/ML injection, ADMINISTER 1 ML(1000 MCG) IN THE MUSCLE EVERY 30 DAYS .  ferrous sulfate (FEROSUL) 325 (65 FE) MG tablet, TAKE 1 TABLET BY MOUTH TWICE DAILY WITH A MEAL  Current Outpatient Medications (Other):  Marland Kitchen  Cholecalciferol (VITAMIN D) 50 MCG (2000 UT) CAPS, Take 1 capsule (2,000 Units total) by mouth daily. .  Diclofenac Sodium 2 % SOLN, Place 2 g onto the skin 2 (two) times daily. Marland Kitchen  omeprazole (PRILOSEC) 40 MG capsule, TAKE 1 CAPSULE(40 MG) BY MOUTH DAILY .   SYRINGE-NEEDLE, DISP, 3 ML (B-D 3CC LUER-LOK SYR 25GX5/8") 25G X 5/8" 3 ML MISC, USE TO INJECT B12 MONTHLY .  cyclobenzaprine (FLEXERIL) 5 MG tablet, Take 1 tablet (5 mg total) by mouth 3 (three) times daily as needed for muscle spasms.    Past medical history, social, surgical and family history all reviewed in electronic medical record.  No pertanent information unless stated regarding to the chief complaint.   Review of Systems:  No headache, visual changes, nausea, vomiting, diarrhea, constipation, dizziness, abdominal pain, skin rash, fevers, chills, night sweats, weight loss, swollen lymph nodes, body aches, joint swelling, muscle aches, chest pain, shortness of breath, mood changes.   Objective  Blood pressure 102/70, pulse 74, height 5\' 5"  (1.651 m), weight 250 lb (113.4 kg), SpO2 97 %.    General: No apparent distress alert and oriented x3 mood and affect normal, dressed appropriately.  HEENT: Pupils equal, extraocular movements intact  Respiratory: Patient's speak in full sentences and does not appear short of breath  Cardiovascular: No lower extremity edema, non tender, no erythema  Skin: Warm dry intact with no signs of infection or rash on extremities or on axial skeleton.  Abdomen: Soft nontender  Neuro: Cranial nerves II through XII are intact, neurovascularly intact in all extremities with 2+ DTRs and 2+ pulses.  Lymph: No lymphadenopathy of posterior or anterior cervical chain or axillae bilaterally.  Gait normal with good balance and coordination.  MSK:  Non tender with full range of motion and good stability and symmetric strength and tone of  elbows, wrist, hip, knee and ankles bilaterally.  Shoulder: Right shoulder Inspection reveals no abnormalities, atrophy or asymmetry. Palpation is normal with no tenderness over AC joint or bicipital groove. ROM is full in all planes. Rotator cuff strength normal throughout. No signs of impingement with negative Neer and Hawkin's  tests, empty can sign. Speeds and Yergason's tests normal. No labral pathology noted with negative Obrien's, negative clunk and good stability. Normal scapular function observed. No painful arc and no drop arm sign. No apprehension sign Contralateral shoulder unremarkable       Impression and Recommendations:      The above documentation has been reviewed and is accurate and complete Lyndal Pulley, DO       Note: This dictation was prepared with Dragon dictation along with smaller phrase technology. Any transcriptional errors that result from this process are unintentional.

## 2019-10-02 ENCOUNTER — Encounter: Payer: Self-pay | Admitting: Internal Medicine

## 2019-10-13 ENCOUNTER — Telehealth: Payer: Self-pay

## 2019-10-13 NOTE — Telephone Encounter (Signed)
Rf rq for high potency Iron tablets from [facility].   Per PCP, pt is due for an appt.   Called pt, line rang and then went busy. Unable to leave a message.

## 2019-10-15 NOTE — Telephone Encounter (Signed)
Called number on file for pt. Mother picked up. Left message with mother to have pt make an appointment.

## 2019-10-19 ENCOUNTER — Other Ambulatory Visit (INDEPENDENT_AMBULATORY_CARE_PROVIDER_SITE_OTHER): Payer: BC Managed Care – PPO

## 2019-10-19 ENCOUNTER — Encounter: Payer: Self-pay | Admitting: Internal Medicine

## 2019-10-19 ENCOUNTER — Other Ambulatory Visit: Payer: Self-pay

## 2019-10-19 ENCOUNTER — Ambulatory Visit (INDEPENDENT_AMBULATORY_CARE_PROVIDER_SITE_OTHER): Payer: BC Managed Care – PPO | Admitting: Internal Medicine

## 2019-10-19 VITALS — BP 130/78 | HR 80 | Temp 98.1°F | Resp 16 | Ht 65.0 in | Wt 261.0 lb

## 2019-10-19 DIAGNOSIS — D500 Iron deficiency anemia secondary to blood loss (chronic): Secondary | ICD-10-CM

## 2019-10-19 DIAGNOSIS — Z789 Other specified health status: Secondary | ICD-10-CM

## 2019-10-19 DIAGNOSIS — D510 Vitamin B12 deficiency anemia due to intrinsic factor deficiency: Secondary | ICD-10-CM

## 2019-10-19 DIAGNOSIS — Z1211 Encounter for screening for malignant neoplasm of colon: Secondary | ICD-10-CM

## 2019-10-19 DIAGNOSIS — Z0000 Encounter for general adult medical examination without abnormal findings: Secondary | ICD-10-CM

## 2019-10-19 LAB — BASIC METABOLIC PANEL
BUN: 15 mg/dL (ref 6–23)
CO2: 29 mEq/L (ref 19–32)
Calcium: 9.5 mg/dL (ref 8.4–10.5)
Chloride: 102 mEq/L (ref 96–112)
Creatinine, Ser: 0.73 mg/dL (ref 0.40–1.20)
GFR: 102.66 mL/min (ref >60.00)
Glucose, Bld: 112 mg/dL — ABNORMAL HIGH (ref 70–99)
Potassium: 3.8 mEq/L (ref 3.5–5.1)
Sodium: 135 mEq/L (ref 135–145)

## 2019-10-19 LAB — LIPID PANEL
Cholesterol: 154 mg/dL (ref 0–200)
HDL: 37.5 mg/dL — ABNORMAL LOW (ref >39.00)
LDL Cholesterol: 99 mg/dL (ref 0–99)
NonHDL: 116.79
Total CHOL/HDL Ratio: 4
Triglycerides: 88 mg/dL (ref 0.0–149.0)
VLDL: 17.6 mg/dL (ref 0.0–40.0)

## 2019-10-19 LAB — IBC PANEL
Iron: 39 ug/dL — ABNORMAL LOW (ref 42–145)
Saturation Ratios: 13.3 % — ABNORMAL LOW (ref 20.0–50.0)
Transferrin: 209 mg/dL — ABNORMAL LOW (ref 212.0–360.0)

## 2019-10-19 LAB — HEPATIC FUNCTION PANEL
ALT: 13 U/L (ref 0–35)
AST: 13 U/L (ref 0–37)
Albumin: 4 g/dL (ref 3.5–5.2)
Alkaline Phosphatase: 74 U/L (ref 39–117)
Bilirubin, Direct: 0 mg/dL (ref 0.0–0.3)
Total Bilirubin: 0.4 mg/dL (ref 0.2–1.2)
Total Protein: 7.8 g/dL (ref 6.0–8.3)

## 2019-10-19 LAB — CBC WITH DIFFERENTIAL/PLATELET
Basophils Absolute: 0.1 10*3/uL (ref 0.0–0.1)
Basophils Relative: 1.1 % (ref 0.0–3.0)
Eosinophils Absolute: 0 10*3/uL (ref 0.0–0.7)
Eosinophils Relative: 0.1 % (ref 0.0–5.0)
HCT: 39 % (ref 36.0–46.0)
Hemoglobin: 12.4 g/dL (ref 12.0–15.0)
Lymphocytes Relative: 31.1 % (ref 12.0–46.0)
Lymphs Abs: 3.5 10*3/uL (ref 0.7–4.0)
MCHC: 31.9 g/dL (ref 30.0–36.0)
MCV: 85.9 fl (ref 78.0–100.0)
Monocytes Absolute: 0.6 10*3/uL (ref 0.1–1.0)
Monocytes Relative: 5.7 % (ref 3.0–12.0)
Neutro Abs: 6.9 10*3/uL (ref 1.4–7.7)
Neutrophils Relative %: 62 % (ref 43.0–77.0)
Platelets: 343 10*3/uL (ref 150.0–400.0)
RBC: 4.54 Mil/uL (ref 3.87–5.11)
RDW: 15.7 % — ABNORMAL HIGH (ref 11.5–15.5)
WBC: 11.1 10*3/uL — ABNORMAL HIGH (ref 4.0–10.5)

## 2019-10-19 LAB — FERRITIN: Ferritin: 203 ng/mL (ref 10.0–291.0)

## 2019-10-19 LAB — FOLATE: Folate: 11.3 ng/mL (ref >5.9)

## 2019-10-19 NOTE — Patient Instructions (Signed)
Health Maintenance, Female Adopting a healthy lifestyle and getting preventive care are important in promoting health and wellness. Ask your health care provider about:  The right schedule for you to have regular tests and exams.  Things you can do on your own to prevent diseases and keep yourself healthy. What should I know about diet, weight, and exercise? Eat a healthy diet   Eat a diet that includes plenty of vegetables, fruits, low-fat dairy products, and lean protein.  Do not eat a lot of foods that are high in solid fats, added sugars, or sodium. Maintain a healthy weight Body mass index (BMI) is used to identify weight problems. It estimates body fat based on height and weight. Your health care provider can help determine your BMI and help you achieve or maintain a healthy weight. Get regular exercise Get regular exercise. This is one of the most important things you can do for your health. Most adults should:  Exercise for at least 150 minutes each week. The exercise should increase your heart rate and make you sweat (moderate-intensity exercise).  Do strengthening exercises at least twice a week. This is in addition to the moderate-intensity exercise.  Spend less time sitting. Even light physical activity can be beneficial. Watch cholesterol and blood lipids Have your blood tested for lipids and cholesterol at 48 years of age, then have this test every 5 years. Have your cholesterol levels checked more often if:  Your lipid or cholesterol levels are high.  You are older than 48 years of age.  You are at high risk for heart disease. What should I know about cancer screening? Depending on your health history and family history, you may need to have cancer screening at various ages. This may include screening for:  Breast cancer.  Cervical cancer.  Colorectal cancer.  Skin cancer.  Lung cancer. What should I know about heart disease, diabetes, and high blood  pressure? Blood pressure and heart disease  High blood pressure causes heart disease and increases the risk of stroke. This is more likely to develop in people who have high blood pressure readings, are of African descent, or are overweight.  Have your blood pressure checked: ? Every 3-5 years if you are 18-39 years of age. ? Every year if you are 40 years old or older. Diabetes Have regular diabetes screenings. This checks your fasting blood sugar level. Have the screening done:  Once every three years after age 40 if you are at a normal weight and have a low risk for diabetes.  More often and at a younger age if you are overweight or have a high risk for diabetes. What should I know about preventing infection? Hepatitis B If you have a higher risk for hepatitis B, you should be screened for this virus. Talk with your health care provider to find out if you are at risk for hepatitis B infection. Hepatitis C Testing is recommended for:  Everyone born from 1945 through 1965.  Anyone with known risk factors for hepatitis C. Sexually transmitted infections (STIs)  Get screened for STIs, including gonorrhea and chlamydia, if: ? You are sexually active and are younger than 48 years of age. ? You are older than 48 years of age and your health care provider tells you that you are at risk for this type of infection. ? Your sexual activity has changed since you were last screened, and you are at increased risk for chlamydia or gonorrhea. Ask your health care provider if   you are at risk.  Ask your health care provider about whether you are at high risk for HIV. Your health care provider may recommend a prescription medicine to help prevent HIV infection. If you choose to take medicine to prevent HIV, you should first get tested for HIV. You should then be tested every 3 months for as long as you are taking the medicine. Pregnancy  If you are about to stop having your period (premenopausal) and  you may become pregnant, seek counseling before you get pregnant.  Take 400 to 800 micrograms (mcg) of folic acid every day if you become pregnant.  Ask for birth control (contraception) if you want to prevent pregnancy. Osteoporosis and menopause Osteoporosis is a disease in which the bones lose minerals and strength with aging. This can result in bone fractures. If you are 65 years old or older, or if you are at risk for osteoporosis and fractures, ask your health care provider if you should:  Be screened for bone loss.  Take a calcium or vitamin D supplement to lower your risk of fractures.  Be given hormone replacement therapy (HRT) to treat symptoms of menopause. Follow these instructions at home: Lifestyle  Do not use any products that contain nicotine or tobacco, such as cigarettes, e-cigarettes, and chewing tobacco. If you need help quitting, ask your health care provider.  Do not use street drugs.  Do not share needles.  Ask your health care provider for help if you need support or information about quitting drugs. Alcohol use  Do not drink alcohol if: ? Your health care provider tells you not to drink. ? You are pregnant, may be pregnant, or are planning to become pregnant.  If you drink alcohol: ? Limit how much you use to 0-1 drink a day. ? Limit intake if you are breastfeeding.  Be aware of how much alcohol is in your drink. In the [place] one drink equals one 12 oz bottle of beer (355 mL), one 5 oz glass of wine (148 mL), or one 1 oz glass of hard liquor (44 mL). General instructions  Schedule regular health, dental, and eye exams.  Stay current with your vaccines.  Tell your health care provider if: ? You often feel depressed. ? You have ever been abused or do not feel safe at home. Summary  Adopting a healthy lifestyle and getting preventive care are important in promoting health and wellness.  Follow your health care provider's instructions about healthy  diet, exercising, and getting tested or screened for diseases.  Follow your health care provider's instructions on monitoring your cholesterol and blood pressure. This information is not intended to replace advice given to you by your health care provider. Make sure you discuss any questions you have with your health care provider. Document Released: 06/10/2011 Document Revised: 11/18/2018 Document Reviewed: 11/18/2018 Elsevier Patient Education  2020 Elsevier Inc.  

## 2019-10-19 NOTE — Progress Notes (Signed)
Subjective:  Patient ID: Gail Payne, female    DOB: October 12, 1971  Age: 48 y.o. MRN: [id number]  CC: Annual Exam and Anemia   HPI Gail Payne presents for a CPX.  She is compliant with B12 injections monthly but has been out of her iron tablet for the last few weeks.  She is now on an oral contraceptive and says her menstrual cycles are no longer longer heavy.  She denies fatigue, shortness of breath, or paresthesias.  Outpatient Medications Prior to Visit  Medication Sig Dispense Refill  . Albuterol Sulfate (PROAIR RESPICLICK) 123XX123 (90 Base) MCG/ACT AEPB Inhale 1 puff into the lungs 4 (four) times daily as needed. 1 each 11  . Cholecalciferol (VITAMIN D) 50 MCG (2000 UT) CAPS Take 1 capsule (2,000 Units total) by mouth daily. 30 capsule 5  . cyanocobalamin (,VITAMIN B-12,) 1000 MCG/ML injection ADMINISTER 1 ML(1000 MCG) IN THE MUSCLE EVERY 30 DAYS 3 mL 1  . Diclofenac Sodium 2 % SOLN Place 2 g onto the skin 2 (two) times daily. 112 g 3  . mometasone-formoterol (DULERA) 200-5 MCG/ACT AERO Inhale 2 puffs into the lungs 2 (two) times daily. 13 g 11  . omeprazole (PRILOSEC) 40 MG capsule TAKE 1 CAPSULE(40 MG) BY MOUTH DAILY 90 capsule 1  . SYRINGE-NEEDLE, DISP, 3 ML (B-D 3CC LUER-LOK SYR 25GX5/8") 25G X 5/8" 3 ML MISC USE TO INJECT B12 MONTHLY 3 each 1  . ferrous sulfate (FEROSUL) 325 (65 FE) MG tablet TAKE 1 TABLET BY MOUTH TWICE DAILY WITH A MEAL 180 tablet 1  . norethindrone (NORLYDA) 0.35 MG tablet Take 1 tablet (0.35 mg total) by mouth daily. 1 Package 11  . cyclobenzaprine (FLEXERIL) 5 MG tablet Take 1 tablet (5 mg total) by mouth 3 (three) times daily as needed for muscle spasms. 60 tablet 1  . etodolac (LODINE XL) 400 MG 24 hr tablet Take 1 tablet (400 mg total) by mouth daily. 90 tablet 0  . montelukast (SINGULAIR) 10 MG tablet Take 1 tablet (10 mg total) by mouth at bedtime. 90 tablet 1   No facility-administered medications prior to visit.     ROS Review of Systems   Constitutional: Positive for unexpected weight change (wt gain). Negative for appetite change, chills, diaphoresis and fatigue.  HENT: Negative.   Eyes: Negative.   Respiratory: Negative for cough, chest tightness, shortness of breath and wheezing.   Cardiovascular: Negative for chest pain, palpitations and leg swelling.  Gastrointestinal: Negative for abdominal pain, blood in stool, constipation, diarrhea, nausea and vomiting.  Endocrine: Negative.   Genitourinary: Negative.  Negative for difficulty urinating, dysuria and hematuria.  Musculoskeletal: Negative.  Negative for arthralgias and myalgias.  Skin: Negative.  Negative for color change and pallor.  Neurological: Negative.  Negative for dizziness, weakness, light-headedness and headaches.  Hematological: Negative for adenopathy. Does not bruise/bleed easily.  Psychiatric/Behavioral: Negative.     Objective:  BP 130/78 (BP Location: Left Arm, Patient Position: Sitting, Cuff Size: Large)   Pulse 80   Temp 98.1 F (36.7 C) (Oral)   Resp 16   Ht 5\' 5"  (1.651 m)   Wt 261 lb (118.4 kg)   LMP 09/22/2019 (Within Days)   SpO2 96%   BMI 43.43 kg/m   BP Readings from Last 3 Encounters:  10/19/19 130/78  09/28/19 102/70  08/23/19 132/90    Wt Readings from Last 3 Encounters:  10/19/19 261 lb (118.4 kg)  09/28/19 250 lb (113.4 kg)  08/23/19 263 lb (119.3 kg)  Physical Exam Vitals signs reviewed.  Constitutional:      Appearance: Normal appearance. She is obese. She is not ill-appearing or diaphoretic.  HENT:     Nose: Nose normal.     Mouth/Throat:     Mouth: Mucous membranes are moist.  Eyes:     General: No scleral icterus.    Conjunctiva/sclera: Conjunctivae normal.  Neck:     Musculoskeletal: Neck supple.  Cardiovascular:     Rate and Rhythm: Normal rate and regular rhythm.     Heart sounds: No murmur.  Pulmonary:     Effort: Pulmonary effort is normal.     Breath sounds: No stridor. No wheezing, rhonchi or  rales.  Abdominal:     General: Abdomen is protuberant. Bowel sounds are normal. There is no distension.     Palpations: Abdomen is soft. There is no hepatomegaly or splenomegaly.     Tenderness: There is no abdominal tenderness.  Musculoskeletal: Normal range of motion.     Right lower leg: No edema.     Left lower leg: No edema.  Lymphadenopathy:     Cervical: No cervical adenopathy.  Skin:    General: Skin is warm and dry.  Neurological:     General: No focal deficit present.     Mental Status: She is alert and oriented to person, place, and time.  Psychiatric:        Mood and Affect: Mood normal.        Behavior: Behavior normal.     Lab Results  Component Value Date   WBC 11.1 (H) 10/19/2019   HGB 12.4 10/19/2019   HCT 39.0 10/19/2019   PLT 343.0 10/19/2019   GLUCOSE 112 (H) 10/19/2019   CHOL 154 10/19/2019   TRIG 88.0 10/19/2019   HDL 37.50 (L) 10/19/2019   LDLCALC 99 10/19/2019   ALT 13 10/19/2019   AST 13 10/19/2019   NA 135 10/19/2019   K 3.8 10/19/2019   CL 102 10/19/2019   CREATININE 0.73 10/19/2019   BUN 15 10/19/2019   CO2 29 10/19/2019   TSH 0.73 04/01/2018   HGBA1C 5.6 04/01/2018    Dg Cervical Spine Complete  Result Date: 05/13/2019 CLINICAL DATA:  RIGHT shoulder and neck pain for 3-4 weeks EXAM: CERVICAL SPINE - COMPLETE 4+ VIEW COMPARISON:  None FINDINGS: Straightening of cervical lordosis question muscle spasm. Prevertebral soft tissues normal thickness. Osseous mineralization normal. Vertebral body and disc space heights maintained. No fracture, subluxation, or bone destruction. Lung apices clear. IMPRESSION: Question muscle spasm; otherwise negative exam Electronically Signed   By: Lavonia Dana M.D.   On: 05/13/2019 08:17   Dg Shoulder Right  Result Date: 05/13/2019 CLINICAL DATA:  RIGHT shoulder and neck pain for 3-4 weeks, pain and decreased range of motion EXAM: RIGHT SHOULDER - 2+ VIEW COMPARISON:  None FINDINGS: Osseous mineralization normal.  Mild degenerative changes of the [facility] joint with inferior spur formation. No fracture, dislocation, or bone destruction. Visualized RIGHT ribs unremarkable. IMPRESSION: Mild degenerative changes of the RIGHT AC joint. Electronically Signed   By: Lavonia Dana M.D.   On: 05/13/2019 08:18    Assessment & Plan:   Tanaija was seen today for annual exam and anemia.  Diagnoses and all orders for this visit:  Vitamin B12 deficiency anemia due to intrinsic factor deficiency- She agrees to continue monthly B12 injections. -     CBC with Differential; Future -     Folate; Future  Routine general medical examination at a health  care facility- Exam completed, labs reviewed, vaccines reviewed and updated, screening for breast and cervical cancer is up-to-date, I recommended that she undergo colon cancer screening, patient education was given. -     Lipid panel; Future  Iron deficiency anemia due to chronic blood loss- Her H&H are normal but her iron level is low.  I recommended that she restart the iron supplement and to undergo a colonoscopy to screen for GI sources of blood loss. -     CBC with Differential; Future -     IBC panel; Future -     Ferritin; Future -     Basic metabolic panel; Future -     Hepatic function panel; Future -     ferrous sulfate (FEROSUL) 325 (65 FE) MG tablet; TAKE 1 TABLET BY MOUTH TWICE DAILY WITH A MEAL  Always uses contraception  Colon cancer screening -     Ambulatory referral to Gastroenterology   I have discontinued Raymon Mutton. Mangano's montelukast, cyclobenzaprine, and etodolac. I am also having her maintain her mometasone-formoterol, Albuterol Sulfate, Vitamin D, B-D 3CC LUER-LOK SYR 25GX5/8", cyanocobalamin, Diclofenac Sodium, omeprazole, norethindrone, and ferrous sulfate.  Meds ordered this encounter  Medications  . ferrous sulfate (FEROSUL) 325 (65 FE) MG tablet    Sig: TAKE 1 TABLET BY MOUTH TWICE DAILY WITH A MEAL    Dispense:  180 tablet    Refill:  1      Follow-up: Return in about 6 months (around 04/17/2020).  Scarlette Calico, MD

## 2019-10-20 ENCOUNTER — Encounter: Payer: Self-pay | Admitting: Internal Medicine

## 2019-10-20 MED ORDER — FERROUS SULFATE 325 (65 FE) MG PO TABS: ORAL_TABLET | ORAL | 1 refills | Status: DC

## 2019-10-22 ENCOUNTER — Encounter: Payer: Self-pay | Admitting: Gastroenterology

## 2019-11-22 ENCOUNTER — Other Ambulatory Visit: Payer: Self-pay

## 2019-11-22 ENCOUNTER — Ambulatory Visit (AMBULATORY_SURGERY_CENTER): Payer: BC Managed Care – PPO | Admitting: *Deleted

## 2019-11-22 VITALS — Temp 98.2°F | Ht 65.0 in | Wt 268.2 lb

## 2019-11-22 DIAGNOSIS — Z1159 Encounter for screening for other viral diseases: Secondary | ICD-10-CM

## 2019-11-22 DIAGNOSIS — Z1211 Encounter for screening for malignant neoplasm of colon: Secondary | ICD-10-CM

## 2019-11-22 MED ORDER — SUPREP BOWEL PREP KIT 17.5-3.13-1.6 GM/177ML PO SOLN: 1.0000 | Freq: Once | ORAL | 0 refills | Status: AC

## 2019-11-22 NOTE — Progress Notes (Signed)
No egg or soy allergy known to patient  No issues with past sedation with any surgeries  or procedures, no intubation problems  No diet pills per patient No home 02 use per patient  No blood thinners per patient  Pt denies issues with constipation  No A fib or A flutter  EMMI video sent to pt's e mail   Due to the COVID-19 pandemic we are asking patients to follow these guidelines. Please only bring one care partner. Please be aware that your care partner may wait in the car in the parking lot or if they feel like they will be too hot to wait in the car, they may wait in the lobby on the 4th floor. All care partners are required to wear a mask the entire time (we do not have any that we can provide them), they need to practice social distancing, and we will do a Covid check for all patient's and care partners when you arrive. Also we will check their temperature and your temperature. If the care partner waits in their car they need to stay in the parking lot the entire time and we will call them on their cell phone when the patient is ready for discharge so they can bring the car to the front of the building. Also all patient's will need to wear a mask into building. suprep $15

## 2019-11-23 ENCOUNTER — Encounter: Payer: Self-pay | Admitting: Gastroenterology

## 2019-11-26 DIAGNOSIS — Z1231 Encounter for screening mammogram for malignant neoplasm of breast: Secondary | ICD-10-CM | POA: Diagnosis not present

## 2019-11-26 DIAGNOSIS — Z6841 Body Mass Index (BMI) 40.0 and over, adult: Secondary | ICD-10-CM | POA: Diagnosis not present

## 2019-11-26 DIAGNOSIS — N920 Excessive and frequent menstruation with regular cycle: Secondary | ICD-10-CM | POA: Diagnosis not present

## 2019-11-26 DIAGNOSIS — Z01419 Encounter for gynecological examination (general) (routine) without abnormal findings: Secondary | ICD-10-CM | POA: Diagnosis not present

## 2019-11-26 DIAGNOSIS — N760 Acute vaginitis: Secondary | ICD-10-CM | POA: Diagnosis not present

## 2019-11-30 LAB — HM MAMMOGRAPHY

## 2019-12-01 ENCOUNTER — Other Ambulatory Visit: Payer: Self-pay | Admitting: Gastroenterology

## 2019-12-01 ENCOUNTER — Ambulatory Visit (INDEPENDENT_AMBULATORY_CARE_PROVIDER_SITE_OTHER): Payer: BC Managed Care – PPO

## 2019-12-01 DIAGNOSIS — Z1159 Encounter for screening for other viral diseases: Secondary | ICD-10-CM

## 2019-12-02 LAB — SARS CORONAVIRUS 2 (TAT 6-24 HRS): SARS Coronavirus 2: NEGATIVE

## 2019-12-06 ENCOUNTER — Ambulatory Visit (AMBULATORY_SURGERY_CENTER): Payer: BC Managed Care – PPO | Admitting: Gastroenterology

## 2019-12-06 ENCOUNTER — Other Ambulatory Visit: Payer: Self-pay

## 2019-12-06 ENCOUNTER — Encounter: Payer: Self-pay | Admitting: Gastroenterology

## 2019-12-06 VITALS — BP 125/68 | HR 64 | Temp 98.4°F | Resp 14 | Ht 65.0 in | Wt 268.2 lb

## 2019-12-06 DIAGNOSIS — D123 Benign neoplasm of transverse colon: Secondary | ICD-10-CM | POA: Diagnosis not present

## 2019-12-06 DIAGNOSIS — D122 Benign neoplasm of ascending colon: Secondary | ICD-10-CM

## 2019-12-06 DIAGNOSIS — D124 Benign neoplasm of descending colon: Secondary | ICD-10-CM | POA: Diagnosis not present

## 2019-12-06 DIAGNOSIS — Z1211 Encounter for screening for malignant neoplasm of colon: Secondary | ICD-10-CM

## 2019-12-06 LAB — HM COLONOSCOPY

## 2019-12-06 MED ORDER — SODIUM CHLORIDE 0.9 % IV SOLN: 500.0000 mL | Freq: Once | INTRAVENOUS | Status: DC

## 2019-12-06 NOTE — Progress Notes (Signed)
Pt's states no medical or surgical changes since previsit or office visit.  JB - temp DT - vitals 

## 2019-12-06 NOTE — Progress Notes (Signed)
To PACU, VSS. Report to [email or web address] 

## 2019-12-06 NOTE — Patient Instructions (Signed)
YOU HAD AN ENDOSCOPIC PROCEDURE TODAY AT THE [facility]:   Refer to the procedure report that was given to you for any specific questions about what was found during the examination.  If the procedure report does not answer your questions, please call your gastroenterologist to clarify.  If you requested that your care partner not be given the details of your procedure findings, then the procedure report has been included in a sealed envelope for you to review at your convenience later.  YOU SHOULD EXPECT: Some feelings of bloating in the abdomen. Passage of more gas than usual.  Walking can help get rid of the air that was put into your GI tract during the procedure and reduce the bloating. If you had a lower endoscopy (such as a colonoscopy or flexible sigmoidoscopy) you may notice spotting of blood in your stool or on the toilet paper. If you underwent a bowel prep for your procedure, you may not have a normal bowel movement for a few days.  Please Note:  You might notice some irritation and congestion in your nose or some drainage.  This is from the oxygen used during your procedure.  There is no need for concern and it should clear up in a day or so.  SYMPTOMS TO REPORT IMMEDIATELY:   Following lower endoscopy (colonoscopy or flexible sigmoidoscopy):  Excessive amounts of blood in the stool  Significant tenderness or worsening of abdominal pains  Swelling of the abdomen that is new, acute  Fever of 100F or higher   For urgent or emergent issues, a gastroenterologist can be reached at any hour by calling [phone].   DIET:  We do recommend a small meal at first, but then you may proceed to your regular diet.  Drink plenty of fluids but you should avoid alcoholic beverages for 24 hours.  MEDICATIONS: Continue present medications.  Please see handouts given to you by your recovery nurse.  ACTIVITY:  You should plan to take it easy for the rest of today and you should  NOT DRIVE or use heavy machinery until tomorrow (because of the sedation medicines used during the test).    FOLLOW UP: Our staff will call the number listed on your records 48-72 hours following your procedure to check on you and address any questions or concerns that you may have regarding the information given to you following your procedure. If we do not reach you, we will leave a message.  We will attempt to reach you two times.  During this call, we will ask if you have developed any symptoms of COVID 19. If you develop any symptoms (ie: fever, flu-like symptoms, shortness of breath, cough etc.) before then, please call [phone].  If you test positive for Covid 19 in the 2 weeks post procedure, please call and report this information to us.    If any biopsies were taken you will be contacted by phone or by letter within the next 1-3 weeks.  Please call us at [phone] if you have not heard about the biopsies in 3 weeks.   Thank you for allowing us to provide for your healthcare needs today.   SIGNATURES/CONFIDENTIALITY: You and/or your care partner have signed paperwork which will be entered into your electronic medical record.  These signatures attest to the fact that that the information above on your After Visit Summary has been reviewed and is understood.  Full responsibility of the confidentiality of this discharge information lies with you and/or   your care-partner. 

## 2019-12-06 NOTE — Op Note (Signed)
[facility] Patient Name: Gail Payne Procedure Date: 12/06/2019 10:40 AM MRN: [id number] Endoscopist: Remo Lipps P. Havery Moros , MD Age: 48 Referring MD:  Date of Birth: 10-08-1971 Gender: Female Account #: [id number] Procedure:                Colonoscopy Indications:              Screening for colorectal malignant neoplasm, This                            is the patient's first colonoscopy Medicines:                Monitored Anesthesia Care Procedure:                Pre-Anesthesia Assessment:                           - Prior to the procedure, a History and Physical                            was performed, and patient medications and                            allergies were reviewed. The patient's tolerance of                            previous anesthesia was also reviewed. The risks                            and benefits of the procedure and the sedation                            options and risks were discussed with the patient.                            All questions were answered, and informed consent                            was obtained. Prior Anticoagulants: The patient has                            taken no previous anticoagulant or antiplatelet                            agents. ASA Grade Assessment: III - A patient with                            severe systemic disease. After reviewing the risks                            and benefits, the patient was deemed in                            satisfactory condition to undergo the procedure.  After obtaining informed consent, the colonoscope                            was passed under direct vision. Throughout the                            procedure, the patient's blood pressure, pulse, and                            oxygen saturations were monitored continuously. The                            Colonoscope was introduced through the anus and                            advanced to the  the cecum, identified by                            appendiceal orifice and ileocecal valve. The                            colonoscopy was performed without difficulty. The                            patient tolerated the procedure well. The quality                            of the bowel preparation was adequate. The terminal                            ileum, ileocecal valve, appendiceal orifice, and                            rectum were photographed. Scope In: 10:47:07 AM Scope Out: 11:01:42 AM Scope Withdrawal Time: 0 hours 12 minutes 3 seconds  Total Procedure Duration: 0 hours 14 minutes 35 seconds  Findings:                 The perianal and digital rectal examinations were                            normal.                           The terminal ileum appeared normal.                           A 3 mm polyp was found in the ascending colon. The                            polyp was sessile. The polyp was removed with a                            cold snare. Resection and retrieval were complete.  A 3 mm polyp was found in the splenic flexure. The                            polyp was sessile. The polyp was removed with a                            cold snare. Resection and retrieval were complete.                           Two sessile polyps were found in the descending                            colon. The polyps were 3 to 4 mm in size. These                            polyps were removed with a cold snare. Resection                            and retrieval were complete.                           Scattered small-mouthed diverticula were found in                            the entire colon.                           Internal hemorrhoids were found during                            retroflexion. The hemorrhoids were small.                           The exam was otherwise without abnormality. Complications:            No immediate complications. Estimated  blood loss:                            Minimal. Estimated Blood Loss:     Estimated blood loss was minimal. Impression:               - The examined portion of the ileum was normal.                           - One 3 mm polyp in the ascending colon, removed                            with a cold snare. Resected and retrieved.                           - One 3 mm polyp at the splenic flexure, removed                            with a cold snare. Resected and retrieved.                           -  Two 3 to 4 mm polyps in the descending colon,                            removed with a cold snare. Resected and retrieved.                           - Diverticulosis in the entire examined colon.                           - Internal hemorrhoids.                           - The examination was otherwise normal. Recommendation:           - Patient has a contact number available for                            emergencies. The signs and symptoms of potential                            delayed complications were discussed with the                            patient. Return to normal activities tomorrow.                            Written discharge instructions were provided to the                            patient.                           - Resume previous diet.                           - Continue present medications.                           - Await pathology results. Remo Lipps P. Porche Steinberger, MD 12/06/2019 11:05:55 AM This report has been signed electronically.

## 2019-12-06 NOTE — Progress Notes (Signed)
Called to room to assist during endoscopic procedure.  Patient ID and intended procedure confirmed with present staff. Received instructions for my participation in the procedure from the performing physician.  

## 2019-12-08 ENCOUNTER — Telehealth: Payer: Self-pay

## 2019-12-08 NOTE — Telephone Encounter (Signed)
  Follow up Call-  Call back number 12/06/2019  Post procedure Call Back phone  # [phone]  Permission to leave phone message Yes  Some recent data might be hidden     Patient questions:  Do you have a fever, pain , or abdominal swelling? No. Pain Score  0 *  Have you tolerated food without any problems? Yes.    Have you been able to return to your normal activities? Yes.    Do you have any questions about your discharge instructions: Diet   No. Medications  No. Follow up visit  No.  Do you have questions or concerns about your Care? No.  Actions: * If pain score is 4 or above: No action needed, pain <4.  1. Have you developed a fever since your procedure? no  2.   Have you had an respiratory symptoms (SOB or cough) since your procedure? no  3.   Have you tested positive for COVID 19 since your procedure no  4.   Have you had any family members/close contacts diagnosed with the COVID 19 since your procedure?  no   If yes to any of these questions please route to Joylene John, RN and Alphonsa Gin, Therapist, sports.

## 2019-12-27 DIAGNOSIS — Q72812 Congenital shortening of left lower limb: Secondary | ICD-10-CM | POA: Diagnosis not present

## 2019-12-27 DIAGNOSIS — M9901 Segmental and somatic dysfunction of cervical region: Secondary | ICD-10-CM | POA: Diagnosis not present

## 2019-12-27 DIAGNOSIS — M50322 Other cervical disc degeneration at C5-C6 level: Secondary | ICD-10-CM | POA: Diagnosis not present

## 2019-12-27 DIAGNOSIS — M9914 Subluxation complex (vertebral) of sacral region: Secondary | ICD-10-CM | POA: Diagnosis not present

## 2019-12-28 ENCOUNTER — Other Ambulatory Visit: Payer: Self-pay

## 2019-12-28 ENCOUNTER — Ambulatory Visit (INDEPENDENT_AMBULATORY_CARE_PROVIDER_SITE_OTHER): Payer: BC Managed Care – PPO | Admitting: Otolaryngology

## 2019-12-28 ENCOUNTER — Encounter (INDEPENDENT_AMBULATORY_CARE_PROVIDER_SITE_OTHER): Payer: Self-pay | Admitting: Otolaryngology

## 2019-12-28 ENCOUNTER — Ambulatory Visit
Admission: RE | Admit: 2019-12-28 | Discharge: 2019-12-28 | Disposition: A | Discharge status: Home / Self Care | Payer: BC Managed Care – PPO | Source: Ambulatory Visit | Attending: Chiropractor | Admitting: Chiropractor

## 2019-12-28 ENCOUNTER — Other Ambulatory Visit: Payer: Self-pay | Admitting: Chiropractor

## 2019-12-28 VITALS — Temp 97.7°F

## 2019-12-28 DIAGNOSIS — M545 Low back pain, unspecified: Secondary | ICD-10-CM

## 2019-12-28 DIAGNOSIS — M542 Cervicalgia: Secondary | ICD-10-CM

## 2019-12-28 DIAGNOSIS — H6982 Other specified disorders of Eustachian tube, left ear: Secondary | ICD-10-CM | POA: Diagnosis not present

## 2019-12-28 DIAGNOSIS — H9041 Sensorineural hearing loss, unilateral, right ear, with unrestricted hearing on the contralateral side: Secondary | ICD-10-CM | POA: Diagnosis not present

## 2019-12-28 LAB — HM MAMMOGRAPHY: HM Mammogram: NORMAL (ref 0–4)

## 2019-12-28 NOTE — Progress Notes (Signed)
HPI: Gail Payne is a 49 y.o. female who presents for evaluation of muffled hearing in her left ear for the past few weeks.  She is hearing better today.  Denies any ear pain.  Past Medical History:  Diagnosis Date  . Anemia    iron deficiency  . Arthritis    knee with surgery , shoulder right   . Asthma   . Fibroids    uterine  . GERD (gastroesophageal reflux disease)   . Heart murmur    discovered during labor and delivery 5 yrs ago   Past Surgical History:  Procedure Laterality Date  . APPENDECTOMY    . BRONCHOSCOPY    . CESAREAN SECTION     x1  . KNEE SURGERY    . scar tissue removed from c section    . TUBAL LIGATION    . WISDOM TOOTH EXTRACTION     Social History   Socioeconomic History  . Marital status: Divorced    Spouse name: Not on file  . Number of children: 1  . Years of education: Not on file  . Highest education level: Not on file  Occupational History  . Not on file  Tobacco Use  . Smoking status: Never Smoker  . Smokeless tobacco: Never Used  Substance and Sexual Activity  . Alcohol use: No    Alcohol/week: 0.0 standard drinks  . Drug use: No  . Sexual activity: Yes    Birth control/protection: Surgical, Pill  Other Topics Concern  . Not on file  Social History Narrative   Regular Exercise- no   Social Determinants of Health   Financial Resource Strain:   . Difficulty of Paying Living Expenses: Not on file  Food Insecurity:   . Worried About Charity fundraiser in the Last Year: Not on file  . Ran Out of Food in the Last Year: Not on file  Transportation Needs:   . Lack of Transportation (Medical): Not on file  . Lack of Transportation (Non-Medical): Not on file  Physical Activity:   . Days of Exercise per Week: Not on file  . Minutes of Exercise per Session: Not on file  Stress:   . Feeling of Stress : Not on file  Social Connections:   . Frequency of Communication with Friends and Family: Not on file  . Frequency of Social  Gatherings with Friends and Family: Not on file  . Attends Religious Services: Not on file  . Active Member of Clubs or Organizations: Not on file  . Attends Archivist Meetings: Not on file  . Marital Status: Not on file   Family History  Problem Relation Age of Onset  . Hypertension Mother   . Heart disease Father   . Asthma Father   . Arthritis Other   . Diabetes Other   . Hyperlipidemia Other   . Hypertension Other   . Learning disabilities Maternal Aunt   . Colon cancer Neg Hx   . Colon polyps Neg Hx   . Esophageal cancer Neg Hx   . Rectal cancer Neg Hx   . Stomach cancer Neg Hx    Allergies  Allergen Reactions  . Latex    Prior to Admission medications   Medication Sig Start Date End Date Taking? Authorizing Provider  Albuterol Sulfate (PROAIR RESPICLICK) 123XX123 (90 Base) MCG/ACT AEPB Inhale 1 puff into the lungs 4 (four) times daily as needed. 02/01/19  Yes [name], MD  aspirin 81 MG chewable tablet  Chew by mouth daily.   Yes [provider]  Cholecalciferol (VITAMIN D) 50 MCG (2000 UT) CAPS Take 1 capsule (2,000 Units total) by mouth daily. 02/01/19  Yes [name], MD  cyanocobalamin (,VITAMIN B-12,) 1000 MCG/ML injection ADMINISTER 1 ML(1000 MCG) IN THE MUSCLE EVERY 30 DAYS 08/09/19  Yes [name], MD  Diclofenac Sodium 2 % SOLN Place 2 g onto the skin 2 (two) times daily. 08/23/19  Yes Lyndal Pulley, DO  Docusate Calcium (STOOL SOFTENER PO) Take by mouth daily. With Iron daily   Yes [provider]  ferrous sulfate (FEROSUL) 325 (65 FE) MG tablet TAKE 1 TABLET BY MOUTH TWICE DAILY WITH A MEAL 10/20/19  Yes [name], MD  mometasone-formoterol (DULERA) 200-5 MCG/ACT AERO Inhale 2 puffs into the lungs 2 (two) times daily. 02/01/19  Yes [name], MD  norethindrone (NORLYDA) 0.35 MG tablet Take 1 tablet (0.35 mg total) by mouth daily. 10/19/19  Yes [name], MD  omeprazole (PRILOSEC) 40 MG capsule TAKE 1  CAPSULE(40 MG) BY MOUTH DAILY 09/16/19  Yes [name], MD  SYRINGE-NEEDLE, DISP, 3 ML (B-D 3CC LUER-LOK SYR 25GX5/8") 25G X 5/8" 3 ML MISC USE TO INJECT B12 MONTHLY 08/09/19  Yes [name], MD     Positive ROS: Otherwise negative.  No dizziness or vertigo.  All other systems have been reviewed and were otherwise negative with the exception of those mentioned in the HPI and as above.  Physical Exam: Constitutional: Alert, well-appearing, no acute distress Ears: External ears without lesions or tenderness. Ear canals are clear bilaterally with intact, clear TMs bilaterally.  No wax buildup. Nasal: External nose without lesions. Septum midline. Clear nasal passages.  Mild rhinitis Oral: Lips and gums without lesions. Tongue and palate mucosa without lesions. Posterior oropharynx clear. Neck: No palpable adenopathy or masses Respiratory: Breathing comfortably  Skin: No facial/neck lesions or rash noted.  Audiogram demonstrated normal hearing in both ears which was symmetric.  SRT's were 10 DB bilaterally she had type A tympanograms bilaterally.  Procedures  Assessment: Left ear muffled hearing.  Questionable etiology  Plan: Reviewed with her concerning the hearing test which showed normal hearing in both ears. No specific treatment recommended.  Radene Journey, MD

## 2019-12-29 LAB — HM PAP SMEAR

## 2019-12-30 ENCOUNTER — Encounter (INDEPENDENT_AMBULATORY_CARE_PROVIDER_SITE_OTHER): Payer: Self-pay

## 2020-01-19 ENCOUNTER — Other Ambulatory Visit: Payer: Self-pay | Admitting: Internal Medicine

## 2020-01-19 DIAGNOSIS — D510 Vitamin B12 deficiency anemia due to intrinsic factor deficiency: Secondary | ICD-10-CM

## 2020-01-19 MED ORDER — CYANOCOBALAMIN 1000 MCG/ML IJ SOLN: INTRAMUSCULAR | 1 refills | Status: DC

## 2020-02-17 ENCOUNTER — Ambulatory Visit: Payer: BC Managed Care – PPO | Attending: Internal Medicine

## 2020-02-17 DIAGNOSIS — Z23 Encounter for immunization: Secondary | ICD-10-CM

## 2020-02-17 NOTE — Progress Notes (Signed)
   Covid-19 Vaccination Clinic  Name:  Gail Payne    MRN: [id number] DOB: 03/23/1971  02/17/2020  Ms. Gail Payne was observed post Covid-19 immunization for 15 minutes without incident. She was provided with Vaccine Information Sheet and instruction to access the V-Safe system.   Ms. Gail Payne was instructed to call 911 with any severe reactions post vaccine: Marland Kitchen Difficulty breathing  . Swelling of face and throat  . A fast heartbeat  . A bad rash all over body  . Dizziness and weakness   Immunizations Administered    Name Date Dose VIS Date Route   Pfizer COVID-19 Vaccine 02/17/2020  8:57 AM 0.3 mL 11/19/2019 Intramuscular   Manufacturer: [place]   Lot: [id number]   [place]: [id number]

## 2020-02-20 ENCOUNTER — Other Ambulatory Visit: Payer: Self-pay | Admitting: Internal Medicine

## 2020-02-20 DIAGNOSIS — M542 Cervicalgia: Secondary | ICD-10-CM

## 2020-02-24 ENCOUNTER — Ambulatory Visit: Payer: BC Managed Care – PPO | Admitting: Family Medicine

## 2020-02-25 ENCOUNTER — Other Ambulatory Visit: Payer: Self-pay | Admitting: Internal Medicine

## 2020-02-25 DIAGNOSIS — N3281 Overactive bladder: Secondary | ICD-10-CM

## 2020-03-03 ENCOUNTER — Other Ambulatory Visit: Payer: Self-pay | Admitting: Internal Medicine

## 2020-03-03 ENCOUNTER — Encounter: Payer: Self-pay | Admitting: Internal Medicine

## 2020-03-03 DIAGNOSIS — D510 Vitamin B12 deficiency anemia due to intrinsic factor deficiency: Secondary | ICD-10-CM

## 2020-03-03 DIAGNOSIS — K219 Gastro-esophageal reflux disease without esophagitis: Secondary | ICD-10-CM

## 2020-03-03 MED ORDER — CYANOCOBALAMIN 1000 MCG/ML IJ SOLN: INTRAMUSCULAR | 1 refills | Status: DC

## 2020-03-03 MED ORDER — "BD LUER-LOK SYRINGE 25G X 5/8"" 3 ML MISC": 1 refills | Status: DC

## 2020-03-03 MED ORDER — OMEPRAZOLE 40 MG PO CPDR: DELAYED_RELEASE_CAPSULE | ORAL | 1 refills | Status: DC

## 2020-03-13 ENCOUNTER — Ambulatory Visit: Payer: BC Managed Care – PPO | Attending: Internal Medicine

## 2020-03-13 DIAGNOSIS — Z23 Encounter for immunization: Secondary | ICD-10-CM

## 2020-03-13 NOTE — Progress Notes (Signed)
   Covid-19 Vaccination Clinic  Name:  Gail Payne    MRN: [id number] DOB: 1971-07-14  03/13/2020  [name] was observed post Covid-19 immunization for 15 minutes without incident. She was provided with Vaccine Information Sheet and instruction to access the V-Safe system.   [name] was instructed to call 911 with any severe reactions post vaccine: Marland Kitchen Difficulty breathing  . Swelling of face and throat  . A fast heartbeat  . A bad rash all over body  . Dizziness and weakness   Immunizations Administered    Name Date Dose VIS Date Route   Pfizer COVID-19 Vaccine 03/13/2020 12:20 PM 0.3 mL 11/19/2019 Intramuscular   Manufacturer: [place]   Lot: [id number]   [place]: [id number]

## 2020-04-03 ENCOUNTER — Encounter: Payer: Self-pay | Admitting: Internal Medicine

## 2020-04-04 ENCOUNTER — Other Ambulatory Visit: Payer: Self-pay | Admitting: Internal Medicine

## 2020-04-04 DIAGNOSIS — N3281 Overactive bladder: Secondary | ICD-10-CM

## 2020-04-04 MED ORDER — MIRABEGRON ER 50 MG PO TB24: 50.0000 mg | ORAL_TABLET | Freq: Every day | ORAL | 0 refills | Status: DC

## 2020-07-12 ENCOUNTER — Other Ambulatory Visit: Payer: Self-pay | Admitting: Internal Medicine

## 2020-07-12 DIAGNOSIS — K219 Gastro-esophageal reflux disease without esophagitis: Secondary | ICD-10-CM

## 2020-07-21 ENCOUNTER — Other Ambulatory Visit: Payer: Self-pay | Admitting: Internal Medicine

## 2020-07-21 DIAGNOSIS — N3281 Overactive bladder: Secondary | ICD-10-CM

## 2020-08-03 ENCOUNTER — Other Ambulatory Visit: Payer: Self-pay | Admitting: Internal Medicine

## 2020-08-03 DIAGNOSIS — D510 Vitamin B12 deficiency anemia due to intrinsic factor deficiency: Secondary | ICD-10-CM

## 2020-10-23 ENCOUNTER — Other Ambulatory Visit: Payer: Self-pay

## 2020-10-23 ENCOUNTER — Encounter: Payer: Self-pay | Admitting: Internal Medicine

## 2020-10-23 ENCOUNTER — Ambulatory Visit (INDEPENDENT_AMBULATORY_CARE_PROVIDER_SITE_OTHER): Payer: BC Managed Care – PPO | Admitting: Internal Medicine

## 2020-10-23 VITALS — BP 150/94 | HR 74 | Temp 98.5°F | Resp 16 | Ht 65.0 in | Wt 273.6 lb

## 2020-10-23 DIAGNOSIS — R739 Hyperglycemia, unspecified: Secondary | ICD-10-CM

## 2020-10-23 DIAGNOSIS — D510 Vitamin B12 deficiency anemia due to intrinsic factor deficiency: Secondary | ICD-10-CM

## 2020-10-23 DIAGNOSIS — Z23 Encounter for immunization: Secondary | ICD-10-CM | POA: Insufficient documentation

## 2020-10-23 DIAGNOSIS — Z0000 Encounter for general adult medical examination without abnormal findings: Secondary | ICD-10-CM

## 2020-10-23 DIAGNOSIS — I10 Essential (primary) hypertension: Secondary | ICD-10-CM

## 2020-10-23 DIAGNOSIS — D500 Iron deficiency anemia secondary to blood loss (chronic): Secondary | ICD-10-CM

## 2020-10-23 DIAGNOSIS — E538 Deficiency of other specified B group vitamins: Secondary | ICD-10-CM

## 2020-10-23 DIAGNOSIS — Z1159 Encounter for screening for other viral diseases: Secondary | ICD-10-CM | POA: Diagnosis not present

## 2020-10-23 LAB — HEPATIC FUNCTION PANEL
ALT: 12 U/L (ref 0–35)
AST: 14 U/L (ref 0–37)
Albumin: 3.9 g/dL (ref 3.5–5.2)
Alkaline Phosphatase: 72 U/L (ref 39–117)
Bilirubin, Direct: 0.1 mg/dL (ref 0.0–0.3)
Total Bilirubin: 0.7 mg/dL (ref 0.2–1.2)
Total Protein: 7.7 g/dL (ref 6.0–8.3)

## 2020-10-23 LAB — URINALYSIS, ROUTINE W REFLEX MICROSCOPIC
Bilirubin Urine: NEGATIVE
Ketones, ur: NEGATIVE
Leukocytes,Ua: NEGATIVE
Nitrite: NEGATIVE
Specific Gravity, Urine: 1.025 (ref 1.000–1.030)
Total Protein, Urine: NEGATIVE
Urine Glucose: NEGATIVE
Urobilinogen, UA: 0.2 (ref 0.0–1.0)
pH: 6 (ref 5.0–8.0)

## 2020-10-23 LAB — CBC WITH DIFFERENTIAL/PLATELET
Basophils Absolute: 0 10*3/uL (ref 0.0–0.1)
Basophils Relative: 0.5 % (ref 0.0–3.0)
Eosinophils Absolute: 0 10*3/uL (ref 0.0–0.7)
Eosinophils Relative: 0 % (ref 0.0–5.0)
HCT: 38.1 % (ref 36.0–46.0)
Hemoglobin: 12.3 g/dL (ref 12.0–15.0)
Lymphocytes Relative: 28.8 % (ref 12.0–46.0)
Lymphs Abs: 2.8 10*3/uL (ref 0.7–4.0)
MCHC: 32.3 g/dL (ref 30.0–36.0)
MCV: 84.4 fl (ref 78.0–100.0)
Monocytes Absolute: 0.5 10*3/uL (ref 0.1–1.0)
Monocytes Relative: 5.2 % (ref 3.0–12.0)
Neutro Abs: 6.3 10*3/uL (ref 1.4–7.7)
Neutrophils Relative %: 65.5 % (ref 43.0–77.0)
Platelets: 343 10*3/uL (ref 150.0–400.0)
RBC: 4.51 Mil/uL (ref 3.87–5.11)
RDW: 15.6 % — ABNORMAL HIGH (ref 11.5–15.5)
WBC: 9.6 10*3/uL (ref 4.0–10.5)

## 2020-10-23 LAB — LIPID PANEL
Cholesterol: 147 mg/dL (ref 0–200)
HDL: 35.1 mg/dL — ABNORMAL LOW (ref >39.00)
LDL Cholesterol: 95 mg/dL (ref 0–99)
NonHDL: 111.45
Total CHOL/HDL Ratio: 4
Triglycerides: 84 mg/dL (ref 0.0–149.0)
VLDL: 16.8 mg/dL (ref 0.0–40.0)

## 2020-10-23 LAB — BASIC METABOLIC PANEL
BUN: 14 mg/dL (ref 6–23)
CO2: 29 mEq/L (ref 19–32)
Calcium: 9 mg/dL (ref 8.4–10.5)
Chloride: 103 mEq/L (ref 96–112)
Creatinine, Ser: 0.71 mg/dL (ref 0.40–1.20)
GFR: 99.64 mL/min (ref >60.00)
Glucose, Bld: 92 mg/dL (ref 70–99)
Potassium: 3.7 mEq/L (ref 3.5–5.1)
Sodium: 137 mEq/L (ref 135–145)

## 2020-10-23 LAB — HEMOGLOBIN A1C: Hgb A1c MFr Bld: 6.1 % (ref 4.6–6.5)

## 2020-10-23 LAB — FOLATE: Folate: 5.8 ng/mL — ABNORMAL LOW (ref >5.9)

## 2020-10-23 LAB — IRON: Iron: 54 ug/dL (ref 42–145)

## 2020-10-23 LAB — FERRITIN: Ferritin: 234.9 ng/mL (ref 10.0–291.0)

## 2020-10-23 LAB — TSH: TSH: 1.17 u[IU]/mL (ref 0.35–4.50)

## 2020-10-23 MED ORDER — FOLIC ACID 1 MG PO TABS: 1.0000 mg | ORAL_TABLET | Freq: Every day | ORAL | 1 refills | Status: DC

## 2020-10-23 MED ORDER — CYANOCOBALAMIN 1000 MCG/ML IJ SOLN: INTRAMUSCULAR | 3 refills | Status: DC

## 2020-10-23 MED ORDER — FERROUS SULFATE 325 (65 FE) MG PO TABS: ORAL_TABLET | ORAL | 1 refills | Status: DC

## 2020-10-23 MED ORDER — INDAPAMIDE 1.25 MG PO TABS: 1.2500 mg | ORAL_TABLET | Freq: Every day | ORAL | 0 refills | Status: DC

## 2020-10-23 NOTE — Patient Instructions (Signed)

## 2020-10-23 NOTE — Progress Notes (Signed)
Subjective:  Patient ID: Gail Payne, female    DOB: 20-Jun-1971  Age: 49 y.o. MRN: [id number]  CC: Annual Exam, Anemia, Hypertension, and Asthma  This visit occurred during the SARS-CoV-2 public health emergency.  Safety protocols were in place, including screening questions prior to the visit, additional usage of staff PPE, and extensive cleaning of exam room while observing appropriate contact time as indicated for disinfecting solutions.    HPI RYKA BEIGHLEY presents for a CPX.  She does not monitor her blood pressure has not taken antihypertensive.  She is active and denies CP, DOE, palpitations, edema, or fatigue.  She complains of weight gain.  She has a history of anemia and is taking an iron supplement and B12 injections.  She has had no recent sources of blood loss.  She denies paresthesias.  Outpatient Medications Prior to Visit  Medication Sig Dispense Refill  . Albuterol Sulfate (PROAIR RESPICLICK) 767 (90 Base) MCG/ACT AEPB Inhale 1 puff into the lungs 4 (four) times daily as needed. 1 each 11  . aspirin 81 MG chewable tablet Chew by mouth daily.    . Cholecalciferol (VITAMIN D) 50 MCG (2000 UT) CAPS Take 1 capsule (2,000 Units total) by mouth daily. 30 capsule 5  . Diclofenac Sodium 2 % SOLN Place 2 g onto the skin 2 (two) times daily. 112 g 3  . Docusate Calcium (STOOL SOFTENER PO) Take by mouth daily. With Iron daily    . mometasone-formoterol (DULERA) 200-5 MCG/ACT AERO Inhale 2 puffs into the lungs 2 (two) times daily. 13 g 11  . MYRBETRIQ 50 MG TB24 tablet TAKE 1 TABLET(50 MG) BY MOUTH DAILY 90 tablet 0  . norethindrone (NORLYDA) 0.35 MG tablet Take 1 tablet (0.35 mg total) by mouth daily. 1 Package 11  . omeprazole (PRILOSEC) 40 MG capsule TAKE 1 CAPSULE DAILY 90 capsule 1  . SYRINGE-NEEDLE, DISP, 3 ML (B-D 3CC LUER-LOK SYR 25GX5/8") 25G X 5/8" 3 ML MISC USE TO INJECT B-12 MONTHLY 3 each 3  . cyanocobalamin (,VITAMIN B-12,) 1000 MCG/ML injection USE TO INJECT 1  ML (1 MG) INTO THE MUSCLE EVERY 30 DAYS 3 mL 3  . ferrous sulfate (FEROSUL) 325 (65 FE) MG tablet TAKE 1 TABLET BY MOUTH TWICE DAILY WITH A MEAL 180 tablet 1   No facility-administered medications prior to visit.    ROS Review of Systems  Constitutional: Positive for unexpected weight change. Negative for appetite change, chills, diaphoresis and fatigue.  HENT: Negative.   Eyes: Negative for visual disturbance.  Respiratory: Negative for cough, chest tightness, shortness of breath and wheezing.   Cardiovascular: Negative for chest pain, palpitations and leg swelling.  Gastrointestinal: Negative for blood in stool, constipation, diarrhea, nausea and vomiting.  Endocrine: Negative.  Negative for polyuria.  Genitourinary: Negative.  Negative for difficulty urinating and dysuria.  Musculoskeletal: Negative for arthralgias.  Skin: Negative.  Negative for color change and pallor.  Neurological: Negative.  Negative for dizziness, facial asymmetry, weakness, light-headedness and numbness.  Hematological: Negative for adenopathy. Does not bruise/bleed easily.  Psychiatric/Behavioral: Negative.     Objective:  BP (!) 150/94 (BP Location: Right Arm, Patient Position: Sitting, Cuff Size: Large)   Pulse 74   Temp 98.5 F (36.9 C) (Oral)   Resp 16   Ht 5\' 5"  (1.651 m)   Wt 273 lb 9.6 oz (124.1 kg)   SpO2 99%   BMI 45.53 kg/m   BP Readings from Last 3 Encounters:  10/23/20 (!) 150/94  12/06/19  125/68  10/19/19 130/78    Wt Readings from Last 3 Encounters:  10/23/20 273 lb 9.6 oz (124.1 kg)  12/06/19 268 lb 3.2 oz (121.7 kg)  11/22/19 268 lb 3.2 oz (121.7 kg)    Physical Exam Vitals reviewed.  Constitutional:      Appearance: She is obese.  HENT:     Nose: Nose normal.     Mouth/Throat:     Mouth: Mucous membranes are moist.  Eyes:     General: No scleral icterus.    Conjunctiva/sclera: Conjunctivae normal.  Cardiovascular:     Rate and Rhythm: Normal rate and regular  rhythm.     Heart sounds: No murmur heard.      Comments: EKG- SR with 1st degree AV block TWI in V1 is not new No LVH or Q waves Pulmonary:     Effort: Pulmonary effort is normal.     Breath sounds: No wheezing, rhonchi or rales.  Abdominal:     General: Abdomen is protuberant. Bowel sounds are normal. There is no distension.     Palpations: Abdomen is soft. There is no hepatomegaly, splenomegaly or mass.     Tenderness: There is no abdominal tenderness.  Musculoskeletal:        General: No tenderness. Normal range of motion.     Cervical back: Neck supple.     Right lower leg: No edema.     Left lower leg: No edema.  Lymphadenopathy:     Cervical: No cervical adenopathy.  Skin:    General: Skin is warm and dry.  Neurological:     General: No focal deficit present.     Mental Status: She is alert and oriented to person, place, and time. Mental status is at baseline.  Psychiatric:        Mood and Affect: Mood normal.        Behavior: Behavior normal.     Lab Results  Component Value Date   WBC 9.6 10/23/2020   HGB 12.3 10/23/2020   HCT 38.1 10/23/2020   PLT 343.0 10/23/2020   GLUCOSE 92 10/23/2020   CHOL 147 10/23/2020   TRIG 84.0 10/23/2020   HDL 35.10 (L) 10/23/2020   LDLCALC 95 10/23/2020   ALT 12 10/23/2020   AST 14 10/23/2020   NA 137 10/23/2020   K 3.7 10/23/2020   CL 103 10/23/2020   CREATININE 0.71 10/23/2020   BUN 14 10/23/2020   CO2 29 10/23/2020   TSH 1.17 10/23/2020   HGBA1C 6.1 10/23/2020    DG Cervical Spine 2 or 3 views  Result Date: 12/28/2019 CLINICAL DATA:  RIGHT side neck pain with RIGHT arm and hand numbness for 6 months, no known injury EXAM: CERVICAL SPINE - 2-3 VIEW COMPARISON:  05/12/2019 FINDINGS: Straightening of cervical lordosis question muscle spasm. Prevertebral soft tissues normal thickness. Osseous mineralization normal. Vertebral body and disc space heights maintained. No fracture, subluxation, or bone destruction. Lung apices  clear. IMPRESSION: Question muscle spasm; otherwise negative exam. If patient is having radicular symptoms to the upper extremity, consider cervical spine MR assessment. Electronically Signed   By: Lavonia Dana M.D.   On: 12/28/2019 12:01   DG Lumbar Spine 2-3 Views  Result Date: 12/28/2019 CLINICAL DATA:  RIGHT-side low back pain radiating to leg and foot for 1 year, no known injury EXAM: LUMBAR SPINE - 2-3 VIEW COMPARISON:  None Correlation: MR lumbar spine 12/28/2017 FINDINGS: Five non-rib-bearing lumbar vertebra. Osseous mineralization normal. Mild degenerative disc disease changes at T11-T12  with minimal endplate sclerosis, spurring and question superior endplate height loss of T12 vertebral body new since 12/28/2017. Vertebral body heights otherwise maintained. No additional fracture, subluxation, or bone destruction. No obvious spondylolysis. SI joints grossly preserved. Surgical clips project anterior to the RIGHT SI joint. IMPRESSION: Degenerative disc disease changes at T12-L1 with mild age-indeterminate superior endplate compression deformity of T12 vertebral body new since 12/28/2017. Electronically Signed   By: Lavonia Dana M.D.   On: 12/28/2019 11:59    Assessment & Plan:   Yarely was seen today for annual exam, anemia, hypertension and asthma.  Diagnoses and all orders for this visit:  Routine general medical examination at a health care facility- Exam completed, labs reviewed, vaccines reviewed and updated, cancer screenings are up-to-date, patient education was given. -     Lipid panel; Future -     Lipid panel  Vitamin B12 deficiency anemia due to intrinsic factor deficiency- Her H&H and B12 levels are normal.  She has developed folate deficiency.  We will continue parenteral B12 replacement therapy. -     cyanocobalamin (,VITAMIN B-12,) 1000 MCG/ML injection; USE TO INJECT 1 ML (1 MG) INTO THE MUSCLE EVERY 30 DAYS -     CBC with Differential/Platelet; Future -     Folate;  Future -     Folate -     CBC with Differential/Platelet  Iron deficiency anemia due to chronic blood loss- Her H&H and iron levels are normal.  Will continue the current iron supplement. -     ferrous sulfate (FEROSUL) 325 (65 FE) MG tablet; TAKE 1 TABLET BY MOUTH TWICE DAILY WITH A MEAL -     CBC with Differential/Platelet; Future -     Iron; Future -     Ferritin; Future -     Ferritin -     Iron -     CBC with Differential/Platelet  Hyperglycemia- Her A1c is at 6.1%.  She is prediabetic.  Medical therapy is not indicated. -     Basic metabolic panel; Future -     Hemoglobin A1c; Future -     Hepatic function panel; Future -     Hepatic function panel -     Hemoglobin A1c -     Basic metabolic panel  Need for hepatitis C screening test -     Hepatitis C antibody; Future -     Hepatitis C antibody  Primary hypertension- She has developed stage II hypertension.  Labs are negative for secondary causes or endorgan damage.  Her EKG is negative for LVH or ischemia.  In addition to lifestyle modifications I recommended that she start taking a thiazide diuretic. -     TSH; Future -     Urinalysis, Routine w reflex microscopic; Future -     EKG 12-Lead -     indapamide (LOZOL) 1.25 MG tablet; Take 1 tablet (1.25 mg total) by mouth daily. -     Urinalysis, Routine w reflex microscopic -     TSH  Need for vaccination for Strep pneumoniae -     Pneumococcal polysaccharide vaccine 23-valent greater than or equal to 2yo subcutaneous/IM  Folate deficiency -     folic acid (FOLVITE) 1 MG tablet; Take 1 tablet (1 mg total) by mouth daily.   I am having Gail Payne start on indapamide and folic acid. I am also having her maintain her mometasone-formoterol, Albuterol Sulfate, Vitamin D, Diclofenac Sodium, norethindrone, aspirin, Docusate Calcium (STOOL SOFTENER PO), omeprazole, Myrbetriq,  B-D 3CC LUER-LOK SYR 25GX5/8", cyanocobalamin, and ferrous sulfate.  Meds ordered this encounter   Medications  . cyanocobalamin (,VITAMIN B-12,) 1000 MCG/ML injection    Sig: USE TO INJECT 1 ML (1 MG) INTO THE MUSCLE EVERY 30 DAYS    Dispense:  3 mL    Refill:  3  . ferrous sulfate (FEROSUL) 325 (65 FE) MG tablet    Sig: TAKE 1 TABLET BY MOUTH TWICE DAILY WITH A MEAL    Dispense:  180 tablet    Refill:  1  . indapamide (LOZOL) 1.25 MG tablet    Sig: Take 1 tablet (1.25 mg total) by mouth daily.    Dispense:  90 tablet    Refill:  0  . folic acid (FOLVITE) 1 MG tablet    Sig: Take 1 tablet (1 mg total) by mouth daily.    Dispense:  90 tablet    Refill:  1   In addition to time spent on CPE, I spent 50 minutes in preparing to see the patient by review of recent labs, imaging and procedures, obtaining and reviewing separately obtained history, communicating with the patient and family or caregiver, ordering medications, tests or procedures, and documenting clinical information in the EHR including the differential Dx, treatment, and any further evaluation and other management of 1. Vitamin B12 deficiency anemia due to intrinsic factor deficiency 2. Iron deficiency anemia due to chronic blood loss 3. Hyperglycemia 4. Primary hypertension 5. Folate deficiency     Follow-up: Return in about 3 months (around 01/23/2021).  Scarlette Calico, MD

## 2020-10-24 LAB — HEPATITIS C ANTIBODY
Hepatitis C Ab: NONREACTIVE
SIGNAL TO CUT-OFF: 0.02 (ref <1.00)

## 2020-11-14 ENCOUNTER — Encounter: Payer: Self-pay | Admitting: Internal Medicine

## 2020-11-15 ENCOUNTER — Encounter: Payer: Self-pay | Admitting: Internal Medicine

## 2020-11-15 ENCOUNTER — Telehealth (INDEPENDENT_AMBULATORY_CARE_PROVIDER_SITE_OTHER): Payer: BC Managed Care – PPO | Admitting: Internal Medicine

## 2020-11-15 ENCOUNTER — Other Ambulatory Visit: Payer: Self-pay | Admitting: Internal Medicine

## 2020-11-15 DIAGNOSIS — J019 Acute sinusitis, unspecified: Secondary | ICD-10-CM | POA: Diagnosis not present

## 2020-11-15 MED ORDER — AMOXICILLIN-POT CLAVULANATE 875-125 MG PO TABS: 1.0000 | ORAL_TABLET | Freq: Two times a day (BID) | ORAL | 0 refills | Status: DC

## 2020-11-15 NOTE — Progress Notes (Signed)
Virtual Visit via Video Note  I connected with Gail Payne on 11/15/20 at  2:15 PM EST by a video enabled telemedicine application and verified that I am speaking with the correct person using two identifiers.   I discussed the limitations of evaluation and management by telemedicine and the availability of in person appointments. The patient expressed understanding and agreed to proceed.  Present for the visit:  Myself, Dr Billey Gosling, Julienne Kass.  The patient is currently at home and I am in the office.    No referring provider.    History of Present Illness: She is here for an acute visit for cold symptoms.   Her symptoms started 4-5 days ago  She is experiencing headache behind eyes - sinus.  The light bothers her, which is new.  She has PND, sinus pressure, cough, ST.    She has tried taking Excedrin migraine   BP 138/89  Review of Systems  Constitutional: Positive for malaise/fatigue. Negative for chills and fever.  HENT: Positive for congestion, sinus pain and sore throat. Negative for ear pain.   Eyes: Positive for photophobia.  Respiratory: Positive for cough (from PND). Negative for shortness of breath and wheezing.   Gastrointestinal: Positive for nausea.  Neurological: Positive for headaches (sinus). Negative for dizziness.      Social History   Socioeconomic History  . Marital status: Divorced    Spouse name: Not on file  . Number of children: 1  . Years of education: Not on file  . Highest education level: Not on file  Occupational History  . Not on file  Tobacco Use  . Smoking status: Never Smoker  . Smokeless tobacco: Never Used  Vaping Use  . Vaping Use: Never used  Substance and Sexual Activity  . Alcohol use: No    Alcohol/week: 0.0 standard drinks  . Drug use: No  . Sexual activity: Yes    Birth control/protection: Surgical, Pill  Other Topics Concern  . Not on file  Social History Narrative   Regular Exercise- no   Social  Determinants of Health   Financial Resource Strain:   . Difficulty of Paying Living Expenses: Not on file  Food Insecurity:   . Worried About Charity fundraiser in the Last Year: Not on file  . Ran Out of Food in the Last Year: Not on file  Transportation Needs:   . Lack of Transportation (Medical): Not on file  . Lack of Transportation (Non-Medical): Not on file  Physical Activity:   . Days of Exercise per Week: Not on file  . Minutes of Exercise per Session: Not on file  Stress:   . Feeling of Stress : Not on file  Social Connections:   . Frequency of Communication with Friends and Family: Not on file  . Frequency of Social Gatherings with Friends and Family: Not on file  . Attends Religious Services: Not on file  . Active Member of Clubs or Organizations: Not on file  . Attends Archivist Meetings: Not on file  . Marital Status: Not on file     Observations/Objective: Appears well in NAD Breathing normally Skin appears warm and dry.  Assessment and Plan:  Acute sinus infection: Acute Likely bacterial  Start Augmentin 875-125 mg BID x 10 day otc cold medications Rest, fluid Call if no improvement    Follow Up Instructions:    I discussed the assessment and treatment plan with the patient. The patient was provided an opportunity  to ask questions and all were answered. The patient agreed with the plan and demonstrated an understanding of the instructions.   The patient was advised to call back or seek an in-person evaluation if the symptoms worsen or if the condition fails to improve as anticipated.    Binnie Rail, MD

## 2020-11-22 ENCOUNTER — Encounter: Payer: Self-pay | Admitting: Internal Medicine

## 2020-11-22 ENCOUNTER — Other Ambulatory Visit: Payer: Self-pay

## 2020-11-22 ENCOUNTER — Ambulatory Visit: Payer: BC Managed Care – PPO | Admitting: Internal Medicine

## 2020-11-22 VITALS — BP 132/86 | HR 60 | Temp 98.2°F | Resp 16 | Ht 65.0 in | Wt 267.0 lb

## 2020-11-22 DIAGNOSIS — G43009 Migraine without aura, not intractable, without status migrainosus: Secondary | ICD-10-CM

## 2020-11-22 MED ORDER — UBRELVY 100 MG PO TABS: 3.0000 | ORAL_TABLET | Freq: Every day | ORAL | 0 refills | Status: DC | PRN

## 2020-11-22 NOTE — Progress Notes (Signed)
Subjective:  Patient ID: Gail Payne, female    DOB: 09-13-1971  Age: 49 y.o. MRN: [id number]  CC: Headache  This visit occurred during the SARS-CoV-2 public health emergency.  Safety protocols were in place, including screening questions prior to the visit, additional usage of staff PPE, and extensive cleaning of exam room while observing appropriate contact time as indicated for disinfecting solutions.    HPI LETY CULLENS presents for f/up - She was seen a week ago and was treated for a sinus infection with Augmentin.  She tells me she does not have any sinus symptoms.  Her complaint is that she had a headache a week ago with photosensitivity and nausea.  She describes the headache as a throbbing sensation in the frontal region.  She got some symptom relief with Excedrin.  She denies vomiting, changes in her vision or hearing, neck pain, or paresthesias.  Outpatient Medications Prior to Visit  Medication Sig Dispense Refill  . Albuterol Sulfate (PROAIR RESPICLICK) 784 (90 Base) MCG/ACT AEPB Inhale 1 puff into the lungs 4 (four) times daily as needed. 1 each 11  . aspirin 81 MG chewable tablet Chew by mouth daily.    . Cholecalciferol (VITAMIN D) 50 MCG (2000 UT) CAPS Take 1 capsule (2,000 Units total) by mouth daily. 30 capsule 5  . cyanocobalamin (,VITAMIN B-12,) 1000 MCG/ML injection USE TO INJECT 1 ML (1 MG) INTO THE MUSCLE EVERY 30 DAYS 3 mL 3  . Docusate Calcium (STOOL SOFTENER PO) Take by mouth daily. With Iron daily    . ferrous sulfate (FEROSUL) 325 (65 FE) MG tablet TAKE 1 TABLET BY MOUTH TWICE DAILY WITH A MEAL 696 tablet 1  . folic acid (FOLVITE) 1 MG tablet Take 1 tablet (1 mg total) by mouth daily. 90 tablet 1  . indapamide (LOZOL) 1.25 MG tablet Take 1 tablet (1.25 mg total) by mouth daily. 90 tablet 0  . mometasone-formoterol (DULERA) 200-5 MCG/ACT AERO Inhale 2 puffs into the lungs 2 (two) times daily. 13 g 11  . MYRBETRIQ 50 MG TB24 tablet TAKE 1 TABLET(50 MG) BY  MOUTH DAILY 90 tablet 0  . norethindrone (MICRONOR) 0.35 MG tablet Take 1 tablet (0.35 mg total) by mouth daily. 1 Package 11  . omeprazole (PRILOSEC) 40 MG capsule TAKE 1 CAPSULE DAILY 90 capsule 1  . SYRINGE-NEEDLE, DISP, 3 ML (B-D 3CC LUER-LOK SYR 25GX5/8") 25G X 5/8" 3 ML MISC USE TO INJECT B-12 MONTHLY 3 each 3  . amoxicillin-clavulanate (AUGMENTIN) 875-125 MG tablet Take 1 tablet by mouth 2 (two) times daily. 20 tablet 0  . Diclofenac Sodium 2 % SOLN Place 2 g onto the skin 2 (two) times daily. 112 g 3   No facility-administered medications prior to visit.    ROS Review of Systems  Constitutional: Negative for appetite change, chills, diaphoresis, fatigue and fever.  HENT: Negative.  Negative for facial swelling, sinus pressure and trouble swallowing.   Eyes: Negative for pain and visual disturbance.  Respiratory: Negative for cough, chest tightness, shortness of breath and wheezing.   Cardiovascular: Negative for chest pain, palpitations and leg swelling.  Gastrointestinal: Positive for nausea. Negative for abdominal pain, diarrhea and vomiting.  Endocrine: Negative.   Genitourinary: Negative.  Negative for difficulty urinating.  Musculoskeletal: Negative.  Negative for neck pain.  Skin: Negative for rash.  Neurological: Positive for headaches. Negative for dizziness, weakness and light-headedness.  Hematological: Negative for adenopathy. Does not bruise/bleed easily.  Psychiatric/Behavioral: Negative.     Objective:  BP 132/86   Pulse 60   Temp 98.2 F (36.8 C) (Oral)   Resp 16   Ht 5\' 5"  (1.651 m)   Wt 267 lb (121.1 kg)   SpO2 99%   BMI 44.43 kg/m   BP Readings from Last 3 Encounters:  11/22/20 132/86  10/23/20 (!) 150/94  12/06/19 125/68    Wt Readings from Last 3 Encounters:  11/22/20 267 lb (121.1 kg)  10/23/20 273 lb 9.6 oz (124.1 kg)  12/06/19 268 lb 3.2 oz (121.7 kg)    Physical Exam Vitals reviewed.  Constitutional:      Appearance: She is  well-developed. She is not ill-appearing.  HENT:     Nose: Nose normal.     Mouth/Throat:     Mouth: Mucous membranes are moist.  Eyes:     General: No scleral icterus.    Extraocular Movements: Extraocular movements intact.     Right eye: Normal extraocular motion and no nystagmus.     Left eye: Normal extraocular motion and no nystagmus.     Conjunctiva/sclera: Conjunctivae normal.     Pupils: Pupils are equal, round, and reactive to light.     Right eye: Pupil is round and reactive.     Left eye: Pupil is round and reactive.  Cardiovascular:     Rate and Rhythm: Normal rate and regular rhythm.     Heart sounds: No murmur heard.   Pulmonary:     Effort: Pulmonary effort is normal.     Breath sounds: No stridor. No wheezing, rhonchi or rales.  Abdominal:     General: Abdomen is protuberant. Bowel sounds are normal.     Palpations: Abdomen is soft. There is no hepatomegaly, splenomegaly or mass.     Tenderness: There is no abdominal tenderness.  Musculoskeletal:        General: Normal range of motion.     Cervical back: Normal range of motion and neck supple. No rigidity.     Right lower leg: No edema.     Left lower leg: No edema.  Skin:    General: Skin is warm and dry.     Coloration: Skin is not pale.  Neurological:     General: No focal deficit present.     Mental Status: She is alert and oriented to person, place, and time. Mental status is at baseline.     Cranial Nerves: Cranial nerves are intact.     Sensory: Sensation is intact.     Motor: Motor function is intact.     Coordination: Coordination is intact. Coordination normal.     Deep Tendon Reflexes: Reflexes normal. Babinski sign absent on the right side. Babinski sign absent on the left side.     Reflex Scores:      Tricep reflexes are 0 on the right side and 0 on the left side.      Bicep reflexes are 0 on the right side and 0 on the left side.      Brachioradialis reflexes are 0 on the right side and 0 on  the left side.      Patellar reflexes are 0 on the right side and 0 on the left side.      Achilles reflexes are 0 on the right side and 0 on the left side. Psychiatric:        Mood and Affect: Mood normal.        Behavior: Behavior normal.     Lab Results  Component Value  Date   WBC 9.6 10/23/2020   HGB 12.3 10/23/2020   HCT 38.1 10/23/2020   PLT 343.0 10/23/2020   GLUCOSE 92 10/23/2020   CHOL 147 10/23/2020   TRIG 84.0 10/23/2020   HDL 35.10 (L) 10/23/2020   LDLCALC 95 10/23/2020   ALT 12 10/23/2020   AST 14 10/23/2020   NA 137 10/23/2020   K 3.7 10/23/2020   CL 103 10/23/2020   CREATININE 0.71 10/23/2020   BUN 14 10/23/2020   CO2 29 10/23/2020   TSH 1.17 10/23/2020   HGBA1C 6.1 10/23/2020    DG Cervical Spine 2 or 3 views  Result Date: 12/28/2019 CLINICAL DATA:  RIGHT side neck pain with RIGHT arm and hand numbness for 6 months, no known injury EXAM: CERVICAL SPINE - 2-3 VIEW COMPARISON:  05/12/2019 FINDINGS: Straightening of cervical lordosis question muscle spasm. Prevertebral soft tissues normal thickness. Osseous mineralization normal. Vertebral body and disc space heights maintained. No fracture, subluxation, or bone destruction. Lung apices clear. IMPRESSION: Question muscle spasm; otherwise negative exam. If patient is having radicular symptoms to the upper extremity, consider cervical spine MR assessment. Electronically Signed   By: Lavonia Dana M.D.   On: 12/28/2019 12:01   DG Lumbar Spine 2-3 Views  Result Date: 12/28/2019 CLINICAL DATA:  RIGHT-side low back pain radiating to leg and foot for 1 year, no known injury EXAM: LUMBAR SPINE - 2-3 VIEW COMPARISON:  None Correlation: MR lumbar spine 12/28/2017 FINDINGS: Five non-rib-bearing lumbar vertebra. Osseous mineralization normal. Mild degenerative disc disease changes at T11-T12 with minimal endplate sclerosis, spurring and question superior endplate height loss of T12 vertebral body new since 12/28/2017. Vertebral  body heights otherwise maintained. No additional fracture, subluxation, or bone destruction. No obvious spondylolysis. SI joints grossly preserved. Surgical clips project anterior to the RIGHT SI joint. IMPRESSION: Degenerative disc disease changes at T12-L1 with mild age-indeterminate superior endplate compression deformity of T12 vertebral body new since 12/28/2017. Electronically Signed   By: Lavonia Dana M.D.   On: 12/28/2019 11:59    Assessment & Plan:   [place] was seen today for headache.  Diagnoses and all orders for this visit:  Migraine without aura and without status migrainosus, not intractable- I recommended she treat this with the CGRP antagonist. -     Ubrogepant (UBRELVY) 100 MG TABS; Take 3 tablets by mouth daily as needed.   I have discontinued Laira Penninger. Blower's Diclofenac Sodium and amoxicillin-clavulanate. I am also having her start on Ubrelvy. Additionally, I am having her maintain her mometasone-formoterol, Albuterol Sulfate, Vitamin D, norethindrone, aspirin, Docusate Calcium (STOOL SOFTENER PO), omeprazole, Myrbetriq, B-D 3CC LUER-LOK SYR 25GX5/8", cyanocobalamin, ferrous sulfate, indapamide, and folic acid.  Meds ordered this encounter  Medications  . Ubrogepant (UBRELVY) 100 MG TABS    Sig: Take 3 tablets by mouth daily as needed.    Dispense:  3 tablet    Refill:  0     Follow-up: Return if symptoms worsen or fail to improve.  Scarlette Calico, MD

## 2020-11-22 NOTE — Patient Instructions (Signed)

## 2020-11-27 ENCOUNTER — Ambulatory Visit: Payer: BC Managed Care – PPO | Admitting: Internal Medicine

## 2020-11-28 ENCOUNTER — Other Ambulatory Visit: Payer: Self-pay | Admitting: Internal Medicine

## 2020-11-28 ENCOUNTER — Telehealth: Payer: Self-pay | Admitting: Internal Medicine

## 2020-11-28 DIAGNOSIS — G43009 Migraine without aura, not intractable, without status migrainosus: Secondary | ICD-10-CM

## 2020-11-28 MED ORDER — UBRELVY 100 MG PO TABS: 3.0000 | ORAL_TABLET | Freq: Every day | ORAL | 2 refills | Status: DC | PRN

## 2020-11-28 NOTE — Telephone Encounter (Signed)
    Patient calling to request order for Ubrogepant (UBRELVY) [place], [place]

## 2020-11-29 ENCOUNTER — Ambulatory Visit: Payer: BC Managed Care – PPO | Admitting: Internal Medicine

## 2020-11-29 ENCOUNTER — Other Ambulatory Visit: Payer: Self-pay | Admitting: Internal Medicine

## 2020-11-29 DIAGNOSIS — G43009 Migraine without aura, not intractable, without status migrainosus: Secondary | ICD-10-CM

## 2020-11-29 NOTE — Telephone Encounter (Signed)
Patient called and said that the pharmacy is needing clarification on Ubrogepant (UBRELVY) 100 MG TABS  For the dosage.    [id number] [address], [place] Phone:  [phone]  Fax:  [phone]

## 2020-11-30 ENCOUNTER — Other Ambulatory Visit: Payer: Self-pay | Admitting: Internal Medicine

## 2020-11-30 DIAGNOSIS — G43009 Migraine without aura, not intractable, without status migrainosus: Secondary | ICD-10-CM

## 2020-11-30 MED ORDER — UBRELVY 100 MG PO TABS: 1.0000 | ORAL_TABLET | Freq: Every day | ORAL | 2 refills | Status: DC | PRN

## 2020-12-06 ENCOUNTER — Telehealth: Payer: Self-pay

## 2020-12-06 NOTE — Telephone Encounter (Signed)
Key: [id number]  Approved 11/06/2020-12/06/2021

## 2020-12-23 ENCOUNTER — Other Ambulatory Visit: Payer: Self-pay | Admitting: Internal Medicine

## 2020-12-23 DIAGNOSIS — I10 Essential (primary) hypertension: Secondary | ICD-10-CM

## 2020-12-27 LAB — HM MAMMOGRAPHY: HM Mammogram: NORMAL (ref 0–4)

## 2021-01-08 ENCOUNTER — Other Ambulatory Visit: Payer: Self-pay | Admitting: Internal Medicine

## 2021-01-08 DIAGNOSIS — Z01419 Encounter for gynecological examination (general) (routine) without abnormal findings: Secondary | ICD-10-CM | POA: Diagnosis not present

## 2021-01-08 DIAGNOSIS — K219 Gastro-esophageal reflux disease without esophagitis: Secondary | ICD-10-CM

## 2021-01-08 DIAGNOSIS — Z1231 Encounter for screening mammogram for malignant neoplasm of breast: Secondary | ICD-10-CM | POA: Diagnosis not present

## 2021-01-08 DIAGNOSIS — Z6841 Body Mass Index (BMI) 40.0 and over, adult: Secondary | ICD-10-CM | POA: Diagnosis not present

## 2021-01-22 ENCOUNTER — Ambulatory Visit: Payer: BC Managed Care – PPO | Admitting: Internal Medicine

## 2021-01-22 DIAGNOSIS — M1711 Unilateral primary osteoarthritis, right knee: Secondary | ICD-10-CM | POA: Diagnosis not present

## 2021-01-22 DIAGNOSIS — M545 Low back pain, unspecified: Secondary | ICD-10-CM | POA: Diagnosis not present

## 2021-01-25 ENCOUNTER — Telehealth: Payer: Self-pay | Admitting: Internal Medicine

## 2021-01-25 NOTE — Telephone Encounter (Signed)
Recv'd records from [facility] forwarded 3 pages to Dr. Scarlette Calico 2/17/22fbg

## 2021-01-29 ENCOUNTER — Ambulatory Visit: Payer: BC Managed Care – PPO | Admitting: Internal Medicine

## 2021-01-30 ENCOUNTER — Ambulatory Visit: Payer: BC Managed Care – PPO | Admitting: Internal Medicine

## 2021-02-18 ENCOUNTER — Other Ambulatory Visit: Payer: Self-pay | Admitting: Internal Medicine

## 2021-02-18 DIAGNOSIS — G43009 Migraine without aura, not intractable, without status migrainosus: Secondary | ICD-10-CM

## 2021-02-20 ENCOUNTER — Ambulatory Visit: Payer: BC Managed Care – PPO | Admitting: Internal Medicine

## 2021-02-20 ENCOUNTER — Encounter: Payer: Self-pay | Admitting: Internal Medicine

## 2021-02-20 ENCOUNTER — Other Ambulatory Visit: Payer: Self-pay | Admitting: Internal Medicine

## 2021-02-20 ENCOUNTER — Other Ambulatory Visit: Payer: Self-pay

## 2021-02-20 VITALS — BP 128/74 | HR 61 | Temp 98.5°F | Resp 16 | Ht 65.0 in | Wt 267.0 lb

## 2021-02-20 DIAGNOSIS — I10 Essential (primary) hypertension: Secondary | ICD-10-CM | POA: Diagnosis not present

## 2021-02-20 DIAGNOSIS — N3281 Overactive bladder: Secondary | ICD-10-CM

## 2021-02-20 DIAGNOSIS — Z23 Encounter for immunization: Secondary | ICD-10-CM | POA: Diagnosis not present

## 2021-02-20 DIAGNOSIS — D510 Vitamin B12 deficiency anemia due to intrinsic factor deficiency: Secondary | ICD-10-CM

## 2021-02-20 DIAGNOSIS — Z0000 Encounter for general adult medical examination without abnormal findings: Secondary | ICD-10-CM

## 2021-02-20 DIAGNOSIS — E538 Deficiency of other specified B group vitamins: Secondary | ICD-10-CM | POA: Diagnosis not present

## 2021-02-20 DIAGNOSIS — R7303 Prediabetes: Secondary | ICD-10-CM | POA: Diagnosis not present

## 2021-02-20 DIAGNOSIS — D500 Iron deficiency anemia secondary to blood loss (chronic): Secondary | ICD-10-CM

## 2021-02-20 LAB — CBC WITH DIFFERENTIAL/PLATELET
Basophils Absolute: 0.1 10*3/uL (ref 0.0–0.1)
Basophils Relative: 1.1 % (ref 0.0–3.0)
Eosinophils Absolute: 0 10*3/uL (ref 0.0–0.7)
Eosinophils Relative: 0 % (ref 0.0–5.0)
HCT: 37.3 % (ref 36.0–46.0)
Hemoglobin: 12.2 g/dL (ref 12.0–15.0)
Lymphocytes Relative: 27.3 % (ref 12.0–46.0)
Lymphs Abs: 2.8 10*3/uL (ref 0.7–4.0)
MCHC: 32.8 g/dL (ref 30.0–36.0)
MCV: 85.9 fl (ref 78.0–100.0)
Monocytes Absolute: 0.6 10*3/uL (ref 0.1–1.0)
Monocytes Relative: 6.2 % (ref 3.0–12.0)
Neutro Abs: 6.7 10*3/uL (ref 1.4–7.7)
Neutrophils Relative %: 65.4 % (ref 43.0–77.0)
Platelets: 447 10*3/uL — ABNORMAL HIGH (ref 150.0–400.0)
RBC: 4.34 Mil/uL (ref 3.87–5.11)
RDW: 15.9 % — ABNORMAL HIGH (ref 11.5–15.5)
WBC: 10.2 10*3/uL (ref 4.0–10.5)

## 2021-02-20 LAB — BASIC METABOLIC PANEL
BUN: 17 mg/dL (ref 6–23)
CO2: 32 mEq/L (ref 19–32)
Calcium: 9.5 mg/dL (ref 8.4–10.5)
Chloride: 99 mEq/L (ref 96–112)
Creatinine, Ser: 0.73 mg/dL (ref 0.40–1.20)
GFR: 96.15 mL/min (ref >60.00)
Glucose, Bld: 103 mg/dL — ABNORMAL HIGH (ref 70–99)
Potassium: 3.8 mEq/L (ref 3.5–5.1)
Sodium: 137 mEq/L (ref 135–145)

## 2021-02-20 LAB — FERRITIN: Ferritin: 282.7 ng/mL (ref 10.0–291.0)

## 2021-02-20 LAB — IRON: Iron: 55 ug/dL (ref 42–145)

## 2021-02-20 LAB — FOLATE: Folate: 18.8 ng/mL (ref >5.9)

## 2021-02-20 MED ORDER — FOLIC ACID 1 MG PO TABS: 1.0000 mg | ORAL_TABLET | Freq: Every day | ORAL | 1 refills | Status: DC

## 2021-02-20 MED ORDER — INDAPAMIDE 1.25 MG PO TABS: 1.2500 mg | ORAL_TABLET | Freq: Every day | ORAL | 1 refills | Status: DC

## 2021-02-20 MED ORDER — FERROUS SULFATE 325 (65 FE) MG PO TABS: ORAL_TABLET | ORAL | 1 refills | Status: DC

## 2021-02-20 MED ORDER — MIRABEGRON ER 50 MG PO TB24: 50.0000 mg | ORAL_TABLET | Freq: Every day | ORAL | 1 refills | Status: DC

## 2021-02-20 NOTE — Progress Notes (Signed)
Subjective:  Patient ID: Gail Payne, female    DOB: 07-14-1971  Age: 50 y.o. MRN: [id number]  CC: Annual Exam, Hypertension, and Anemia  This visit occurred during the SARS-CoV-2 public health emergency.  Safety protocols were in place, including screening questions prior to the visit, additional usage of staff PPE, and extensive cleaning of exam room while observing appropriate contact time as indicated for disinfecting solutions.    HPI Gail Payne presents for a CPX.  She walks several miles a day and does not experience CP, DOE, palpitations, edema, or fatigue.  She tells me her blood pressure has been well controlled.  Outpatient Medications Prior to Visit  Medication Sig Dispense Refill  . Albuterol Sulfate (PROAIR RESPICLICK) 195 (90 Base) MCG/ACT AEPB Inhale 1 puff into the lungs 4 (four) times daily as needed. 1 each 11  . aspirin 81 MG chewable tablet Chew by mouth daily.    . Cholecalciferol (VITAMIN D) 50 MCG (2000 UT) CAPS Take 1 capsule (2,000 Units total) by mouth daily. 30 capsule 5  . cyanocobalamin (,VITAMIN B-12,) 1000 MCG/ML injection USE TO INJECT 1 ML (1 MG) INTO THE MUSCLE EVERY 30 DAYS 3 mL 3  . Docusate Calcium (STOOL SOFTENER PO) Take by mouth daily. With Iron daily    . norethindrone (MICRONOR) 0.35 MG tablet Take 1 tablet (0.35 mg total) by mouth daily. 1 Package 11  . omeprazole (PRILOSEC) 40 MG capsule TAKE 1 CAPSULE DAILY 90 capsule 1  . SYRINGE-NEEDLE, DISP, 3 ML (B-D 3CC LUER-LOK SYR 25GX5/8") 25G X 5/8" 3 ML MISC USE TO INJECT B-12 MONTHLY 3 each 3  . UBRELVY 100 MG TABS TAKE 1 TABLET BY MOUTH DAILY AS NEEDED 8 tablet 2  . ferrous sulfate (FEROSUL) 325 (65 FE) MG tablet TAKE 1 TABLET BY MOUTH TWICE DAILY WITH A MEAL 093 tablet 1  . folic acid (FOLVITE) 1 MG tablet Take 1 tablet (1 mg total) by mouth daily. 90 tablet 1  . indapamide (LOZOL) 1.25 MG tablet TAKE 1 TABLET(1.25 MG) BY MOUTH DAILY 90 tablet 0  . mometasone-formoterol (DULERA) 200-5  MCG/ACT AERO Inhale 2 puffs into the lungs 2 (two) times daily. 13 g 11  . MYRBETRIQ 50 MG TB24 tablet TAKE 1 TABLET(50 MG) BY MOUTH DAILY 90 tablet 0   No facility-administered medications prior to visit.    ROS Review of Systems  Constitutional: Negative for diaphoresis, fatigue and unexpected weight change.  HENT: Negative.   Eyes: Negative.   Respiratory: Negative for cough, chest tightness, shortness of breath and wheezing.   Cardiovascular: Negative for chest pain, palpitations and leg swelling.  Gastrointestinal: Negative for abdominal pain, constipation, diarrhea, nausea and vomiting.  Endocrine: Positive for polyuria.  Genitourinary: Positive for frequency. Negative for decreased urine volume, difficulty urinating, dysuria, hematuria and urgency.  Musculoskeletal: Negative.  Negative for arthralgias.  Skin: Negative.   Allergic/Immunologic: Negative.   Neurological: Negative.  Negative for dizziness, weakness, light-headedness and headaches.  Hematological: Negative for adenopathy. Does not bruise/bleed easily.  Psychiatric/Behavioral: Negative.     Objective:  BP 128/74   Pulse 61   Temp 98.5 F (36.9 C) (Oral)   Resp 16   Ht 5\' 5"  (1.651 m)   Wt 267 lb (121.1 kg)   SpO2 99%   BMI 44.43 kg/m   BP Readings from Last 3 Encounters:  02/20/21 128/74  11/22/20 132/86  10/23/20 (!) 150/94    Wt Readings from Last 3 Encounters:  02/20/21 267 lb (121.1  kg)  11/22/20 267 lb (121.1 kg)  10/23/20 273 lb 9.6 oz (124.1 kg)    Physical Exam Vitals reviewed.  Constitutional:      Appearance: She is obese.  HENT:     Nose: Nose normal.     Mouth/Throat:     Mouth: Mucous membranes are moist.  Eyes:     General: No scleral icterus.    Conjunctiva/sclera: Conjunctivae normal.  Cardiovascular:     Rate and Rhythm: Normal rate and regular rhythm.     Heart sounds: No murmur heard.   Pulmonary:     Effort: Pulmonary effort is normal.     Breath sounds: Normal  breath sounds.  Abdominal:     General: Abdomen is protuberant. Bowel sounds are normal.     Palpations: There is no hepatomegaly, splenomegaly or mass.     Tenderness: There is no abdominal tenderness.  Musculoskeletal:        General: Normal range of motion.     Cervical back: Neck supple.     Right lower leg: No edema.     Left lower leg: No edema.  Lymphadenopathy:     Cervical: No cervical adenopathy.  Skin:    General: Skin is warm and dry.     Coloration: Skin is not pale.  Neurological:     General: No focal deficit present.     Mental Status: She is alert.  Psychiatric:        Mood and Affect: Mood normal.        Behavior: Behavior normal.     Lab Results  Component Value Date   WBC 10.2 02/20/2021   HGB 12.2 02/20/2021   HCT 37.3 02/20/2021   PLT 447.0 (H) 02/20/2021   GLUCOSE 103 (H) 02/20/2021   CHOL 147 10/23/2020   TRIG 84.0 10/23/2020   HDL 35.10 (L) 10/23/2020   LDLCALC 95 10/23/2020   ALT 12 10/23/2020   AST 14 10/23/2020   NA 137 02/20/2021   K 3.8 02/20/2021   CL 99 02/20/2021   CREATININE 0.73 02/20/2021   BUN 17 02/20/2021   CO2 32 02/20/2021   TSH 1.17 10/23/2020   HGBA1C 6.1 10/23/2020    DG Cervical Spine 2 or 3 views  Result Date: 12/28/2019 CLINICAL DATA:  RIGHT side neck pain with RIGHT arm and hand numbness for 6 months, no known injury EXAM: CERVICAL SPINE - 2-3 VIEW COMPARISON:  05/12/2019 FINDINGS: Straightening of cervical lordosis question muscle spasm. Prevertebral soft tissues normal thickness. Osseous mineralization normal. Vertebral body and disc space heights maintained. No fracture, subluxation, or bone destruction. Lung apices clear. IMPRESSION: Question muscle spasm; otherwise negative exam. If patient is having radicular symptoms to the upper extremity, consider cervical spine MR assessment. Electronically Signed   By: Lavonia Dana M.D.   On: 12/28/2019 12:01   DG Lumbar Spine 2-3 Views  Result Date: 12/28/2019 CLINICAL  DATA:  RIGHT-side low back pain radiating to leg and foot for 1 year, no known injury EXAM: LUMBAR SPINE - 2-3 VIEW COMPARISON:  None Correlation: MR lumbar spine 12/28/2017 FINDINGS: Five non-rib-bearing lumbar vertebra. Osseous mineralization normal. Mild degenerative disc disease changes at T11-T12 with minimal endplate sclerosis, spurring and question superior endplate height loss of T12 vertebral body new since 12/28/2017. Vertebral body heights otherwise maintained. No additional fracture, subluxation, or bone destruction. No obvious spondylolysis. SI joints grossly preserved. Surgical clips project anterior to the RIGHT SI joint. IMPRESSION: Degenerative disc disease changes at T12-L1 with mild age-indeterminate  superior endplate compression deformity of T12 vertebral body new since 12/28/2017. Electronically Signed   By: Lavonia Dana M.D.   On: 12/28/2019 11:59    Assessment & Plan:   Gail Payne was seen today for annual exam, hypertension and anemia.  Diagnoses and all orders for this visit:  Primary hypertension-her blood pressure is adequately well controlled.  Electrolytes and renal function are normal. -     Basic metabolic panel; Future -     Cancel: Hepatic function panel; Future -     indapamide (LOZOL) 1.25 MG tablet; Take 1 tablet (1.25 mg total) by mouth daily. 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 -     Folate -     CBC with Differential/Platelet  Iron deficiency anemia due to chronic blood loss -     CBC with Differential/Platelet; Future -     ferrous sulfate (FEROSUL) 325 (65 FE) MG tablet; TAKE 1 TABLET BY MOUTH TWICE DAILY WITH A  MEAL -     Iron; Future -     Ferritin; Future -     Ferritin -     Iron -     CBC with Differential/Platelet  Routine general medical examination at a health care facility -     Cancel: Lipid panel; Future  Prediabetes -     Basic metabolic panel; Future -     Cancel: Hemoglobin A1c; Future -     Basic metabolic panel  OAB (overactive bladder) -     mirabegron ER (MYRBETRIQ) 50 MG TB24 tablet; Take 1 tablet (50 mg total) by mouth daily.  Other orders -     Tdap vaccine greater than or equal to 7yo IM   I have discontinued Raymon Mutton. Gail Payne's mometasone-formoterol. I have changed her Myrbetriq to mirabegron ER. I have also changed her indapamide. Additionally, I am having her maintain her Albuterol Sulfate, Vitamin D, norethindrone, aspirin, Docusate Calcium (STOOL SOFTENER PO), B-D 3CC LUER-LOK SYR 25GX5/8", cyanocobalamin, omeprazole, Ubrelvy, folic acid, and ferrous sulfate.  Meds ordered this encounter  Medications  . folic acid (FOLVITE) 1 MG tablet    Sig: Take 1 tablet (1 mg total) by mouth daily.    Dispense:  90 tablet    Refill:  1  . ferrous sulfate (FEROSUL) 325 (65 FE) MG tablet    Sig: TAKE 1 TABLET BY MOUTH TWICE DAILY WITH A MEAL    Dispense:  180 tablet    Refill:  1  . mirabegron ER (MYRBETRIQ) 50 MG TB24 tablet    Sig: Take 1 tablet (50 mg total) by mouth daily.    Dispense:  90 tablet    Refill:  1  . indapamide (LOZOL) 1.25 MG tablet    Sig: Take 1 tablet (1.25 mg total) by mouth daily.    Dispense:  90 tablet    Refill:  1     Follow-up: Return in about 6 months (around 08/23/2021).  Scarlette Calico, MD

## 2021-02-20 NOTE — Patient Instructions (Signed)

## 2021-03-22 ENCOUNTER — Other Ambulatory Visit: Payer: Self-pay | Admitting: Internal Medicine

## 2021-03-22 ENCOUNTER — Encounter: Payer: Self-pay | Admitting: Internal Medicine

## 2021-03-22 DIAGNOSIS — J208 Acute bronchitis due to other specified organisms: Secondary | ICD-10-CM | POA: Insufficient documentation

## 2021-03-22 DIAGNOSIS — U071 COVID-19: Secondary | ICD-10-CM

## 2021-03-22 MED ORDER — HYDROCODONE-HOMATROPINE 5-1.5 MG/5ML PO SYRP
5.0000 mL | ORAL_SOLUTION | Freq: Three times a day (TID) | ORAL | 0 refills | Status: DC | PRN
Start: 2021-03-22 — End: 2021-04-06

## 2021-04-06 ENCOUNTER — Other Ambulatory Visit: Payer: Self-pay | Admitting: Internal Medicine

## 2021-04-06 DIAGNOSIS — D500 Iron deficiency anemia secondary to blood loss (chronic): Secondary | ICD-10-CM

## 2021-04-06 MED ORDER — FERROUS SULFATE 325 (65 FE) MG PO TABS: ORAL_TABLET | ORAL | 1 refills | Status: DC

## 2021-04-19 ENCOUNTER — Other Ambulatory Visit: Payer: Self-pay | Admitting: Internal Medicine

## 2021-04-19 DIAGNOSIS — D500 Iron deficiency anemia secondary to blood loss (chronic): Secondary | ICD-10-CM

## 2021-05-01 ENCOUNTER — Other Ambulatory Visit: Payer: Self-pay | Admitting: Internal Medicine

## 2021-05-01 DIAGNOSIS — D510 Vitamin B12 deficiency anemia due to intrinsic factor deficiency: Secondary | ICD-10-CM

## 2021-05-11 IMAGING — CR DG LUMBAR SPINE 2-3V
3 series · 3 of 3 positions shown · non-contrast
Comparison: None

Correlation: MR lumbar spine 12/28/2017

CLINICAL DATA: RIGHT-side low back pain radiating to leg and foot
for 1 year, no known injury

EXAM:
LUMBAR SPINE - 2-3 VIEW

[w lumbar spine ap]
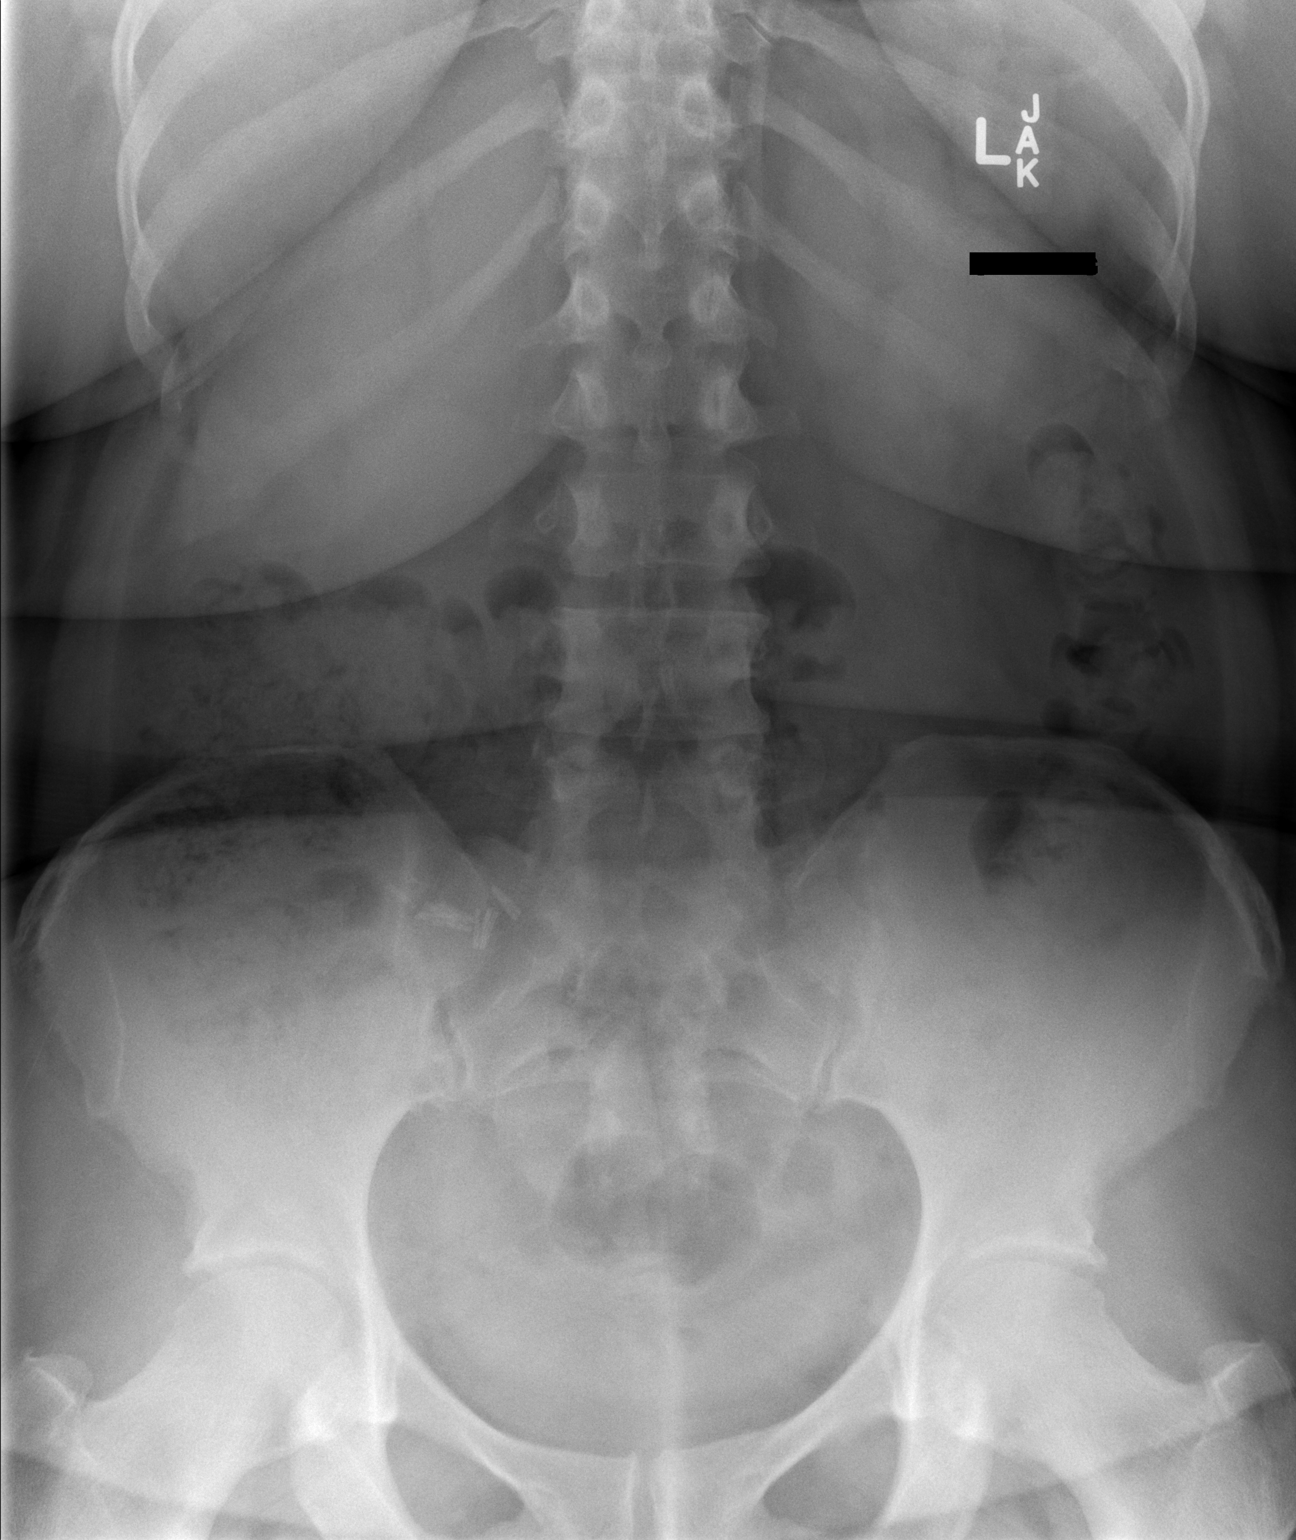

[w lumbar spine lat]
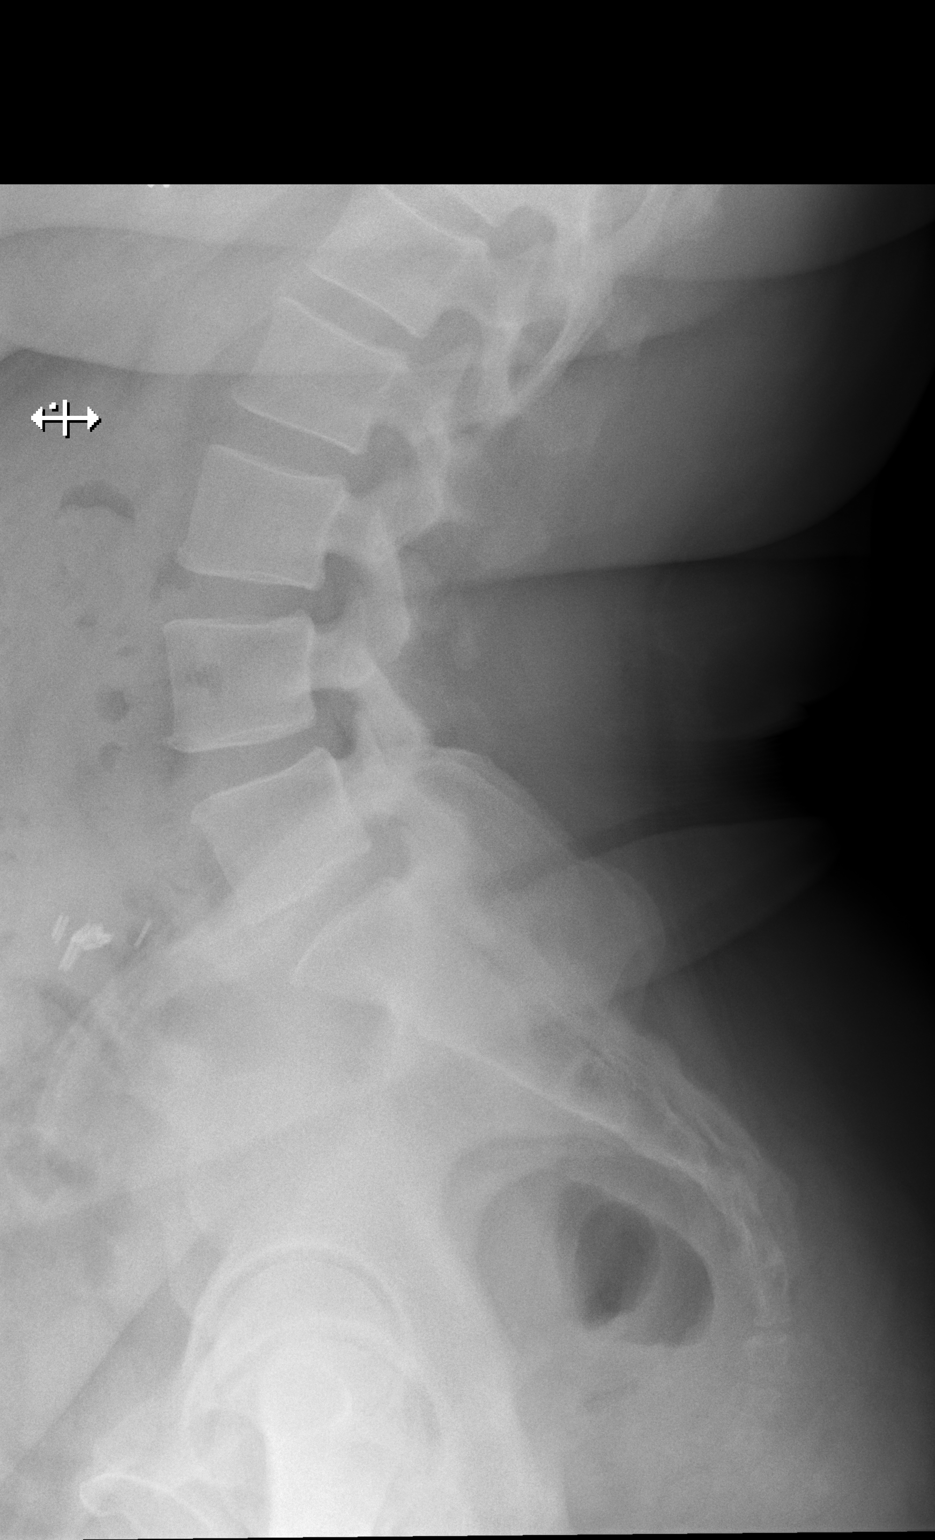

[w lumbar l-5 s-1 spot]
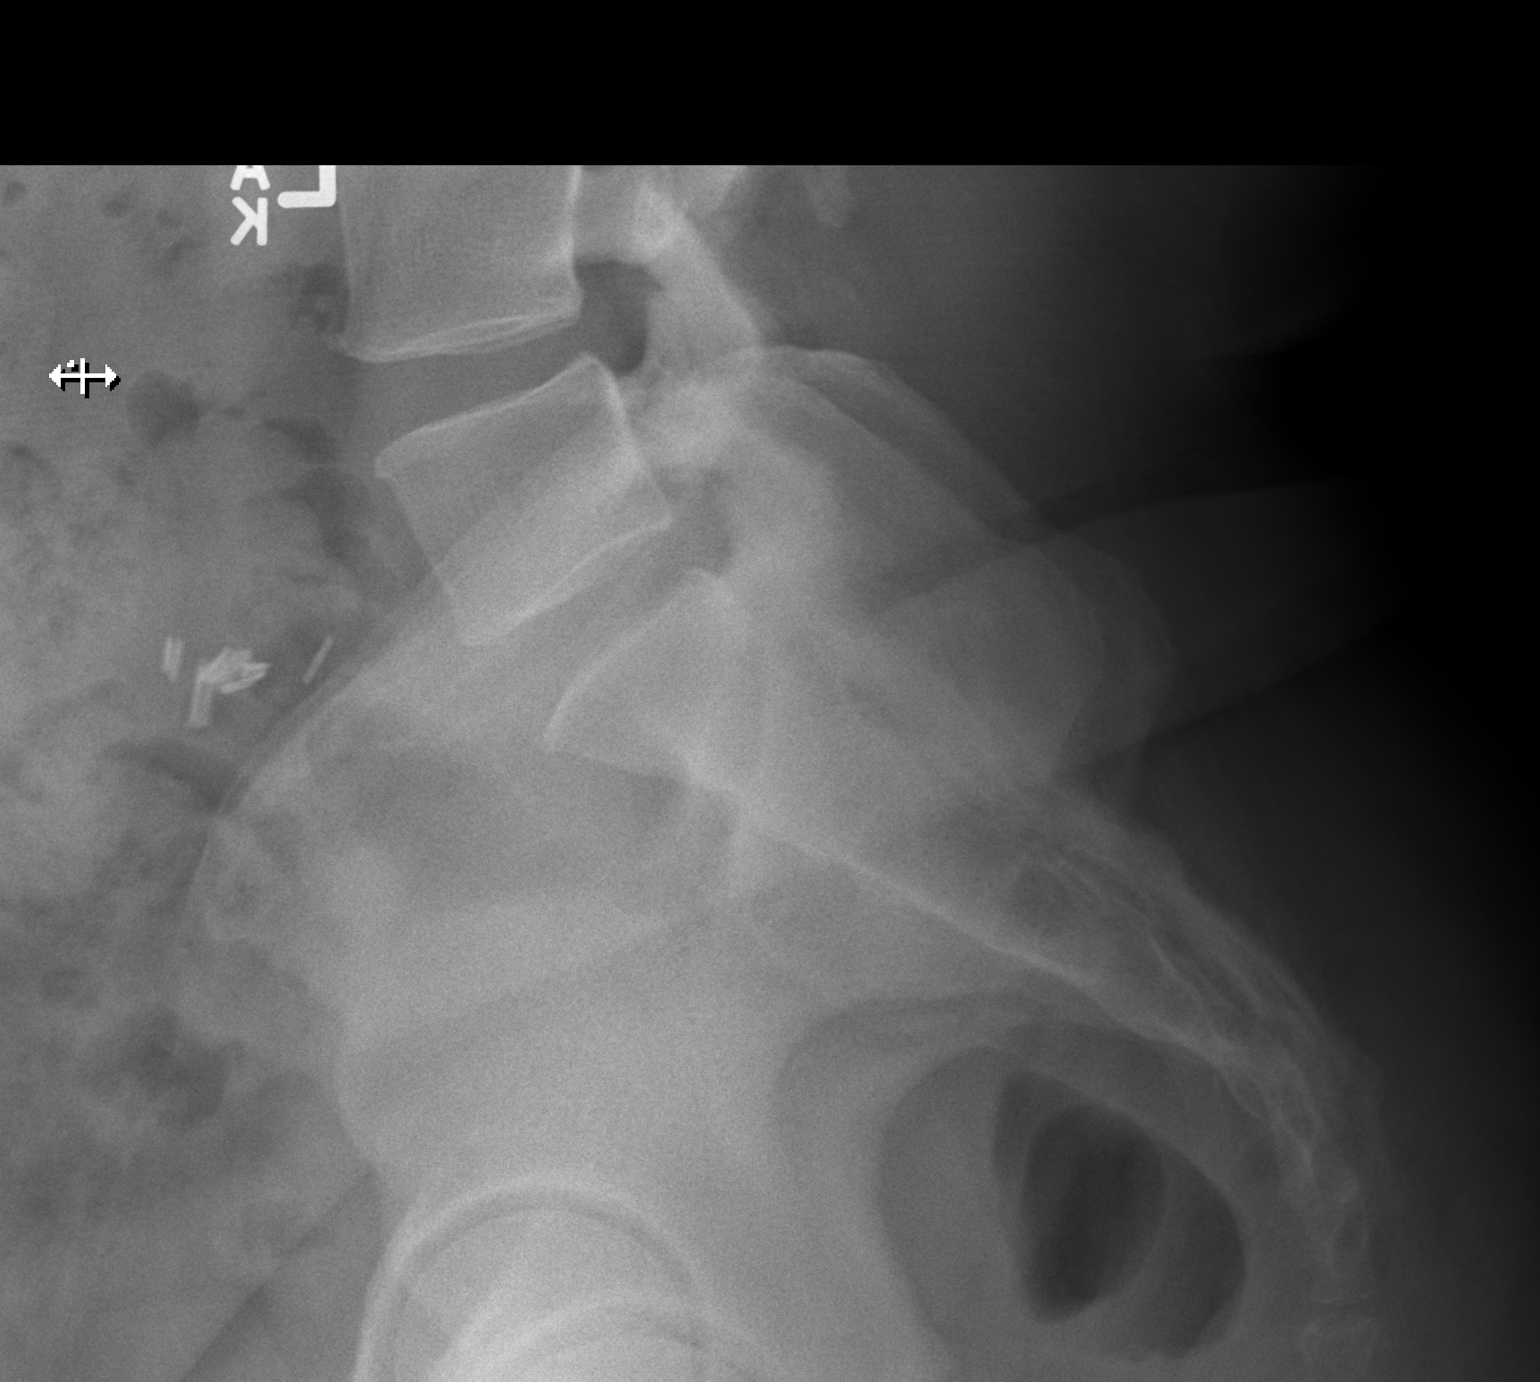

[3 of 3 positions shown; findings below may reference images not displayed]

FINDINGS: Five non-rib-bearing lumbar vertebra.

Osseous mineralization normal.

Mild degenerative disc disease changes at T11-T12 with minimal
endplate sclerosis, spurring and question superior endplate height
loss of T12 vertebral body new since 12/28/2017.

Vertebral body heights otherwise maintained.

No additional fracture, subluxation, or bone destruction.

No obvious spondylolysis.

SI joints grossly preserved.

Surgical clips project anterior to the RIGHT SI joint.
IMPRESSION: Degenerative disc disease changes at T12-L1 with mild
age-indeterminate superior endplate compression deformity of T12
vertebral body new since 12/28/2017.

## 2021-05-11 IMAGING — CR DG CERVICAL SPINE 2 OR 3 VIEWS
4 series · 4 of 4 positions shown · non-contrast
Comparison: 05/12/2019

CLINICAL DATA: RIGHT side neck pain with RIGHT arm and hand
numbness for 6 months, no known injury

EXAM:
CERVICAL SPINE - 2-3 VIEW

[w cervical spine lat]
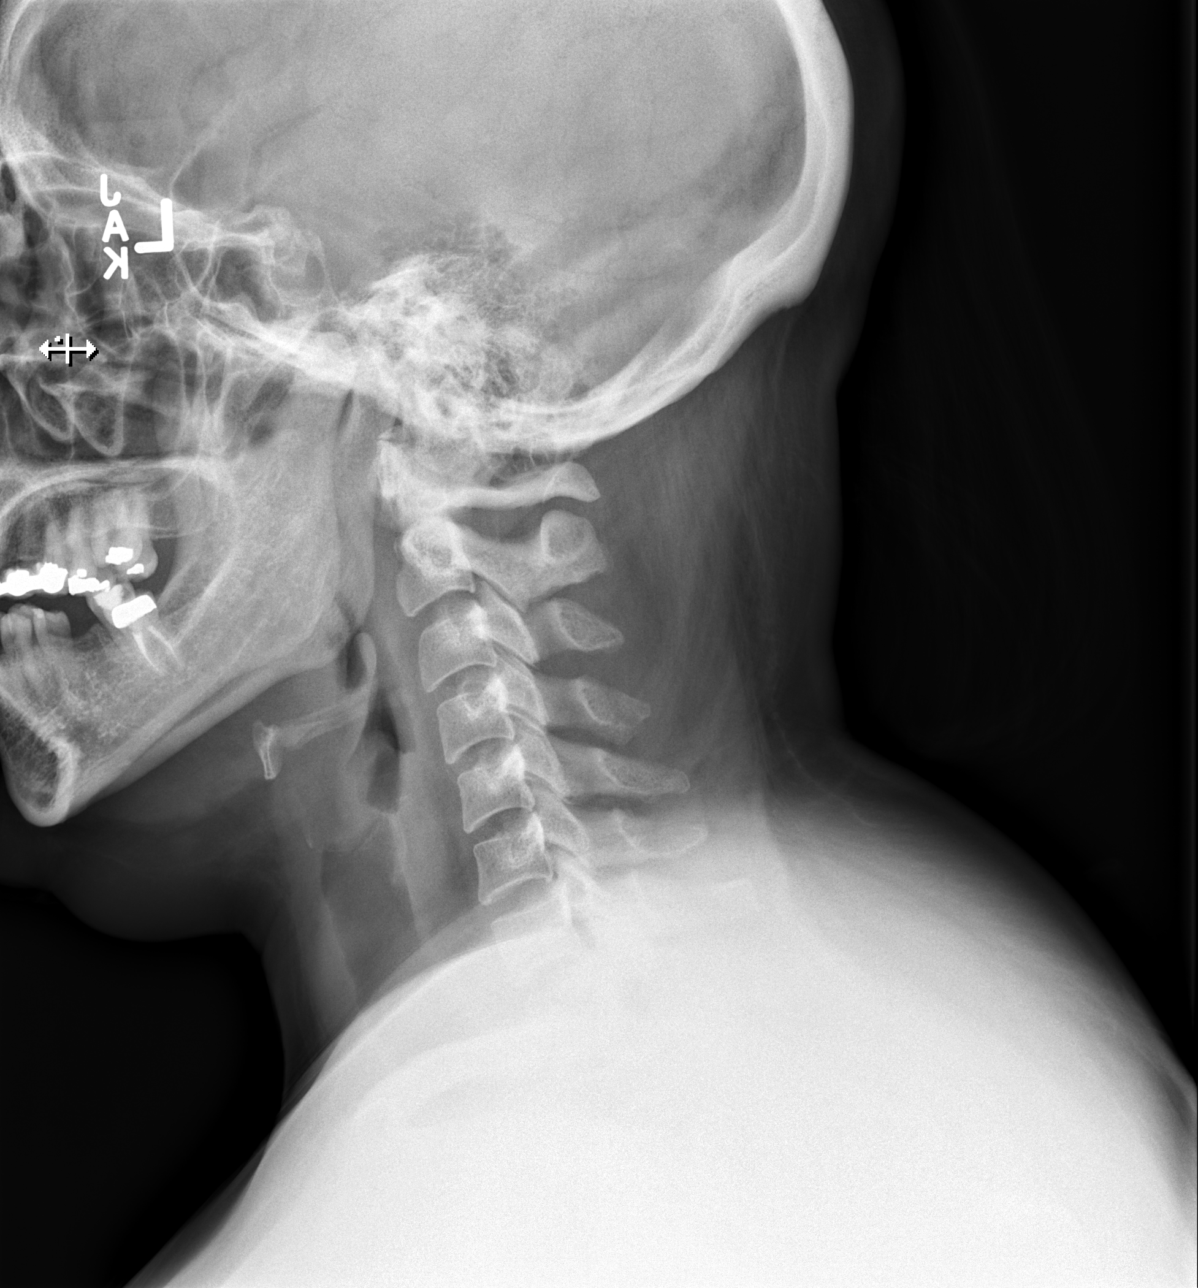

[w cervical spine ap (1 of 2)]
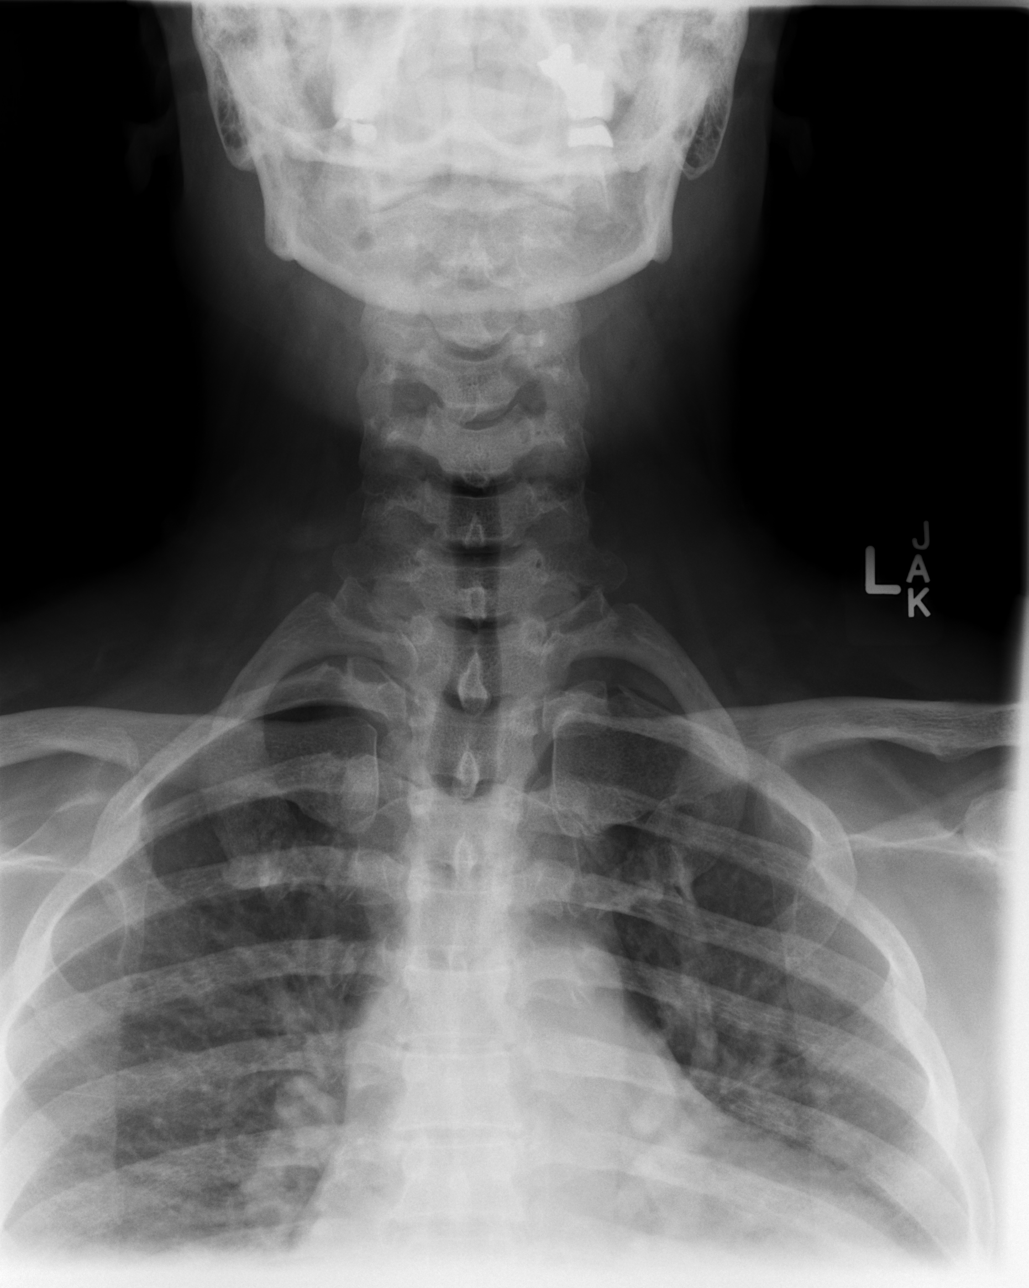

[w cervical spine ap (2 of 2)]
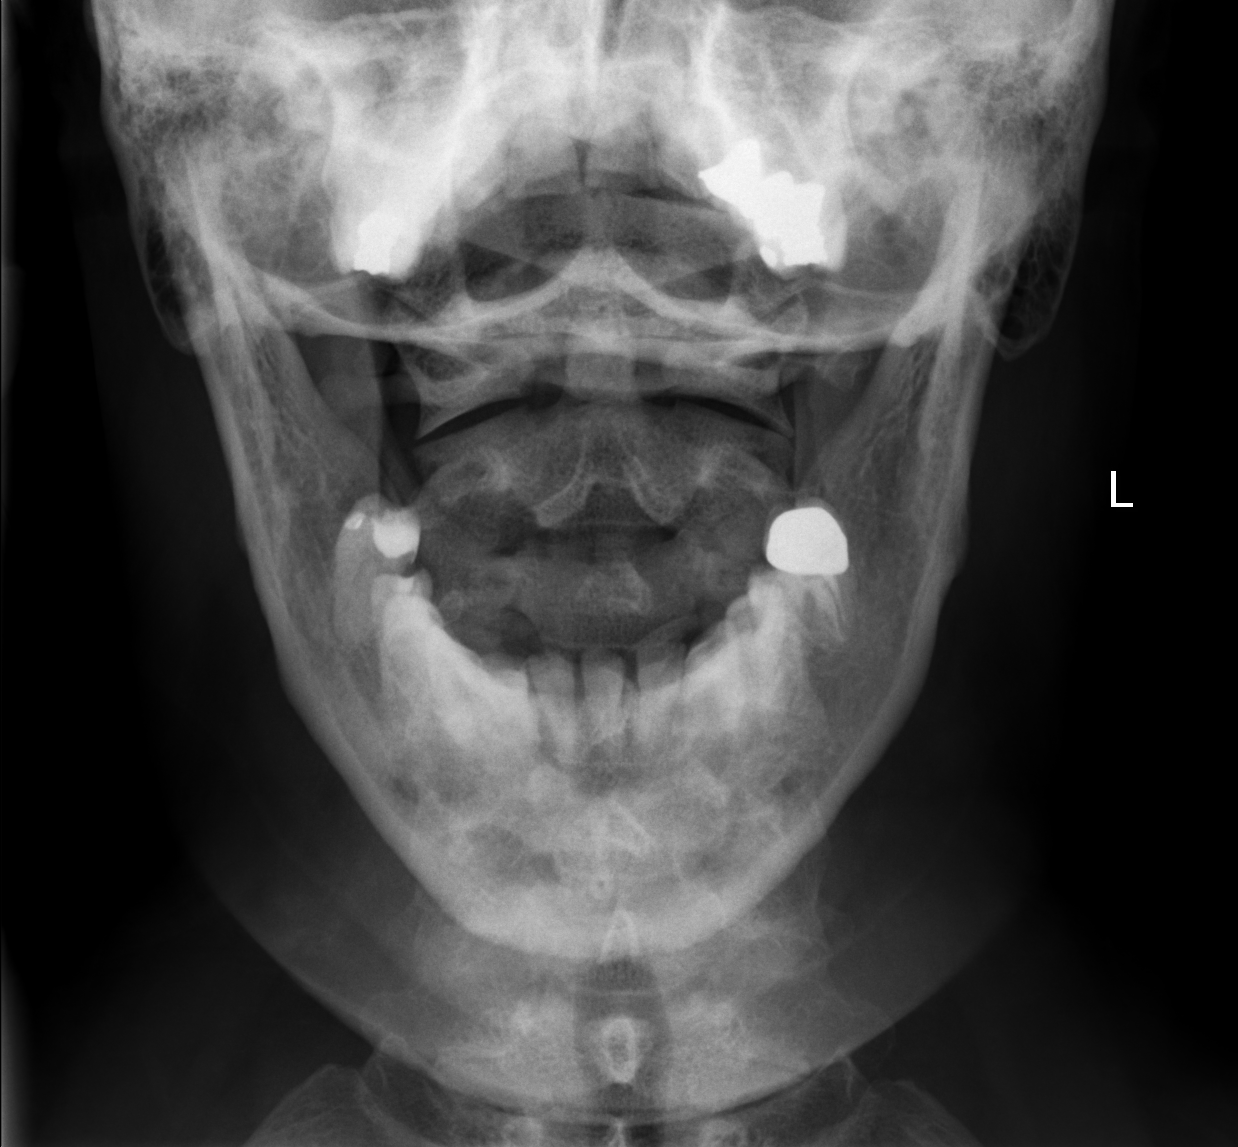

[w cervical swimmers]
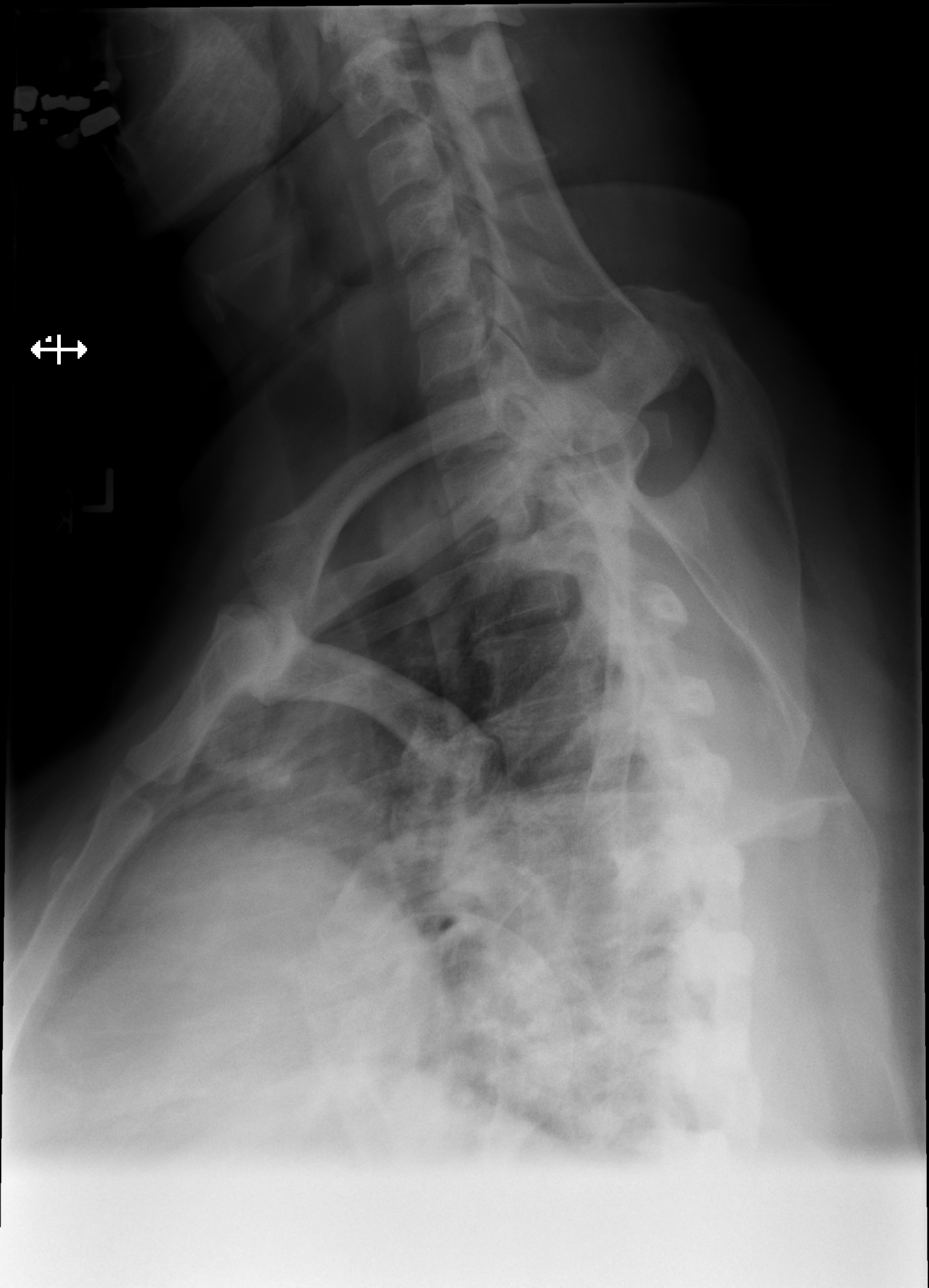

[4 of 4 positions shown; findings below may reference images not displayed]

FINDINGS: Straightening of cervical lordosis question muscle spasm.

Prevertebral soft tissues normal thickness.

Osseous mineralization normal.

Vertebral body and disc space heights maintained.

No fracture, subluxation, or bone destruction.

Lung apices clear.
IMPRESSION: Question muscle spasm; otherwise negative exam.

If patient is having radicular symptoms to the upper extremity,
consider cervical spine MR assessment.

## 2021-06-18 ENCOUNTER — Other Ambulatory Visit: Payer: Self-pay | Admitting: Internal Medicine

## 2021-06-18 DIAGNOSIS — G43009 Migraine without aura, not intractable, without status migrainosus: Secondary | ICD-10-CM

## 2021-07-09 ENCOUNTER — Other Ambulatory Visit: Payer: Self-pay | Admitting: Internal Medicine

## 2021-07-09 DIAGNOSIS — K219 Gastro-esophageal reflux disease without esophagitis: Secondary | ICD-10-CM

## 2021-07-12 ENCOUNTER — Other Ambulatory Visit: Payer: Self-pay | Admitting: Internal Medicine

## 2021-07-12 DIAGNOSIS — D500 Iron deficiency anemia secondary to blood loss (chronic): Secondary | ICD-10-CM

## 2021-07-12 DIAGNOSIS — E538 Deficiency of other specified B group vitamins: Secondary | ICD-10-CM

## 2021-07-30 ENCOUNTER — Other Ambulatory Visit: Payer: Self-pay | Admitting: Internal Medicine

## 2021-07-30 DIAGNOSIS — D510 Vitamin B12 deficiency anemia due to intrinsic factor deficiency: Secondary | ICD-10-CM

## 2021-08-06 ENCOUNTER — Other Ambulatory Visit: Payer: Self-pay | Admitting: Internal Medicine

## 2021-08-06 DIAGNOSIS — I10 Essential (primary) hypertension: Secondary | ICD-10-CM

## 2021-08-08 DIAGNOSIS — N920 Excessive and frequent menstruation with regular cycle: Secondary | ICD-10-CM | POA: Diagnosis not present

## 2021-08-08 DIAGNOSIS — D259 Leiomyoma of uterus, unspecified: Secondary | ICD-10-CM | POA: Diagnosis not present

## 2021-08-16 DIAGNOSIS — N946 Dysmenorrhea, unspecified: Secondary | ICD-10-CM | POA: Diagnosis not present

## 2021-08-16 DIAGNOSIS — E139 Other specified diabetes mellitus without complications: Secondary | ICD-10-CM | POA: Diagnosis not present

## 2021-08-16 DIAGNOSIS — D259 Leiomyoma of uterus, unspecified: Secondary | ICD-10-CM | POA: Diagnosis not present

## 2021-08-16 DIAGNOSIS — N920 Excessive and frequent menstruation with regular cycle: Secondary | ICD-10-CM | POA: Diagnosis not present

## 2021-08-27 ENCOUNTER — Ambulatory Visit: Payer: BC Managed Care – PPO | Admitting: Internal Medicine

## 2021-08-27 ENCOUNTER — Encounter: Payer: Self-pay | Admitting: Internal Medicine

## 2021-08-27 ENCOUNTER — Other Ambulatory Visit: Payer: Self-pay

## 2021-08-27 VITALS — BP 124/82 | HR 78 | Temp 98.6°F | Ht 65.0 in | Wt 259.0 lb

## 2021-08-27 DIAGNOSIS — D510 Vitamin B12 deficiency anemia due to intrinsic factor deficiency: Secondary | ICD-10-CM | POA: Diagnosis not present

## 2021-08-27 DIAGNOSIS — R7303 Prediabetes: Secondary | ICD-10-CM | POA: Diagnosis not present

## 2021-08-27 DIAGNOSIS — D500 Iron deficiency anemia secondary to blood loss (chronic): Secondary | ICD-10-CM

## 2021-08-27 DIAGNOSIS — E876 Hypokalemia: Secondary | ICD-10-CM

## 2021-08-27 DIAGNOSIS — N3281 Overactive bladder: Secondary | ICD-10-CM | POA: Diagnosis not present

## 2021-08-27 DIAGNOSIS — I10 Essential (primary) hypertension: Secondary | ICD-10-CM

## 2021-08-27 DIAGNOSIS — E538 Deficiency of other specified B group vitamins: Secondary | ICD-10-CM | POA: Diagnosis not present

## 2021-08-27 DIAGNOSIS — T502X5A Adverse effect of carbonic-anhydrase inhibitors, benzothiadiazides and other diuretics, initial encounter: Secondary | ICD-10-CM

## 2021-08-27 DIAGNOSIS — Z23 Encounter for immunization: Secondary | ICD-10-CM | POA: Diagnosis not present

## 2021-08-27 DIAGNOSIS — E118 Type 2 diabetes mellitus with unspecified complications: Secondary | ICD-10-CM

## 2021-08-27 DIAGNOSIS — R739 Hyperglycemia, unspecified: Secondary | ICD-10-CM

## 2021-08-27 LAB — CBC WITH DIFFERENTIAL/PLATELET
Basophils Absolute: 0.1 10*3/uL (ref 0.0–0.1)
Basophils Relative: 0.6 % (ref 0.0–3.0)
Eosinophils Absolute: 0 10*3/uL (ref 0.0–0.7)
Eosinophils Relative: 0 % (ref 0.0–5.0)
HCT: 38.5 % (ref 36.0–46.0)
Hemoglobin: 12.5 g/dL (ref 12.0–15.0)
Lymphocytes Relative: 31.5 % (ref 12.0–46.0)
Lymphs Abs: 3.4 10*3/uL (ref 0.7–4.0)
MCHC: 32.6 g/dL (ref 30.0–36.0)
MCV: 84.7 fl (ref 78.0–100.0)
Monocytes Absolute: 0.6 10*3/uL (ref 0.1–1.0)
Monocytes Relative: 5.1 % (ref 3.0–12.0)
Neutro Abs: 6.8 10*3/uL (ref 1.4–7.7)
Neutrophils Relative %: 62.8 % (ref 43.0–77.0)
Platelets: 386 10*3/uL (ref 150.0–400.0)
RBC: 4.55 Mil/uL (ref 3.87–5.11)
RDW: 15.6 % — ABNORMAL HIGH (ref 11.5–15.5)
WBC: 10.9 10*3/uL — ABNORMAL HIGH (ref 4.0–10.5)

## 2021-08-27 LAB — BASIC METABOLIC PANEL
BUN: 18 mg/dL (ref 6–23)
CO2: 33 mEq/L — ABNORMAL HIGH (ref 19–32)
Calcium: 10 mg/dL (ref 8.4–10.5)
Chloride: 98 mEq/L (ref 96–112)
Creatinine, Ser: 0.73 mg/dL (ref 0.40–1.20)
GFR: 95.81 mL/min (ref >60.00)
Glucose, Bld: 102 mg/dL — ABNORMAL HIGH (ref 70–99)
Potassium: 3.1 mEq/L — ABNORMAL LOW (ref 3.5–5.1)
Sodium: 140 mEq/L (ref 135–145)

## 2021-08-27 LAB — IBC + FERRITIN
Ferritin: 304.9 ng/mL — ABNORMAL HIGH (ref 10.0–291.0)
Iron: 57 ug/dL (ref 42–145)
Saturation Ratios: 20 % (ref 20.0–50.0)
TIBC: 285.6 ug/dL (ref 250.0–450.0)
Transferrin: 204 mg/dL — ABNORMAL LOW (ref 212.0–360.0)

## 2021-08-27 LAB — HEMOGLOBIN A1C: Hgb A1c MFr Bld: 6.5 % (ref 4.6–6.5)

## 2021-08-27 MED ORDER — TIRZEPATIDE 2.5 MG/0.5ML ~~LOC~~ SOAJ: 2.5000 mg | SUBCUTANEOUS | 0 refills | Status: DC

## 2021-08-27 MED ORDER — MIRABEGRON ER 50 MG PO TB24: 50.0000 mg | ORAL_TABLET | Freq: Every day | ORAL | 1 refills | Status: DC

## 2021-08-27 NOTE — Patient Instructions (Signed)

## 2021-08-27 NOTE — Progress Notes (Signed)
Subjective:  Patient ID: Gail Payne, female    DOB: 07/06/1971  Age: 50 y.o. MRN: [id number]  CC: Hypertension  This visit occurred during the SARS-CoV-2 public health emergency.  Safety protocols were in place, including screening questions prior to the visit, additional usage of staff PPE, and extensive cleaning of exam room while observing appropriate contact time as indicated for disinfecting solutions.    HPI RENIYA MICHELINI presents for f/up -  She exercises and does not experience chest pain, shortness of breath, diaphoresis, dizziness, lightheadedness, edema, palpitations, or near syncope.  She has lost 8 pounds with lifestyle modifications.  Outpatient Medications Prior to Visit  Medication Sig Dispense Refill   Albuterol Sulfate (PROAIR RESPICLICK) 123XX123 (90 Base) MCG/ACT AEPB Inhale 1 puff into the lungs 4 (four) times daily as needed. 1 each 11   aspirin 81 MG chewable tablet Chew by mouth daily.     B-D 3CC LUER-LOK SYR 25GX5/8" 25G X 5/8" 3 ML MISC USE TO INJECT B-12 MONTHLY 3 each 3   Cholecalciferol (VITAMIN D) 50 MCG (2000 UT) CAPS Take 1 capsule (2,000 Units total) by mouth daily. 30 capsule 5   cyanocobalamin (,VITAMIN B-12,) 1000 MCG/ML injection USE TO INJECT 1 ML (1 MG) INTO THE MUSCLE EVERY 30 DAYS 3 mL 0   Docusate Calcium (STOOL SOFTENER PO) Take by mouth daily. With Iron daily     FEROSUL 325 (65 Fe) MG tablet TAKE 1 TABLET TWICE A DAY WITH MEALS 99991111 tablet 0   folic acid (FOLVITE) 1 MG tablet TAKE 1 TABLET DAILY 90 tablet 0   indapamide (LOZOL) 1.25 MG tablet TAKE 1 TABLET DAILY 90 tablet 0   norethindrone (MICRONOR) 0.35 MG tablet Take 1 tablet (0.35 mg total) by mouth daily. 1 Package 11   omeprazole (PRILOSEC) 40 MG capsule TAKE 1 CAPSULE DAILY 90 capsule 1   UBRELVY 100 MG TABS TAKE 1 TABLET BY MOUTH DAILY AS NEEDED 8 tablet 2   mirabegron ER (MYRBETRIQ) 50 MG TB24 tablet Take 1 tablet (50 mg total) by mouth daily. 90 tablet 1   No  facility-administered medications prior to visit.    ROS Review of Systems  Constitutional:  Negative for chills, diaphoresis, fatigue and fever.  HENT: Negative.    Eyes: Negative.   Respiratory:  Negative for cough, chest tightness, shortness of breath and wheezing.   Cardiovascular:  Negative for chest pain, palpitations and leg swelling.  Gastrointestinal:  Negative for abdominal pain, constipation and diarrhea.  Endocrine: Negative.   Genitourinary:  Positive for frequency. Negative for difficulty urinating and dysuria.  Musculoskeletal: Negative.  Negative for arthralgias and myalgias.  Skin: Negative.  Negative for color change and pallor.  Neurological:  Negative for dizziness, weakness and light-headedness.  Hematological:  Negative for adenopathy. Does not bruise/bleed easily.  Psychiatric/Behavioral: Negative.     Objective:  BP 124/82 (BP Location: Left Arm, Patient Position: Sitting, Cuff Size: Large)   Pulse 78   Temp 98.6 F (37 C) (Oral)   Ht '5\' 5"'$  (1.651 m)   Wt 259 lb (117.5 kg)   SpO2 95%   BMI 43.10 kg/m   BP Readings from Last 3 Encounters:  08/27/21 124/82  02/20/21 128/74  11/22/20 132/86    Wt Readings from Last 3 Encounters:  08/27/21 259 lb (117.5 kg)  02/20/21 267 lb (121.1 kg)  11/22/20 267 lb (121.1 kg)    Physical Exam Vitals reviewed.  Constitutional:      Appearance: Normal appearance.  HENT:     Mouth/Throat:     Mouth: Mucous membranes are moist.  Eyes:     Conjunctiva/sclera: Conjunctivae normal.  Cardiovascular:     Rate and Rhythm: Normal rate and regular rhythm.     Heart sounds: No murmur heard. Pulmonary:     Effort: Pulmonary effort is normal.     Breath sounds: No stridor. No wheezing, rhonchi or rales.  Abdominal:     General: Abdomen is flat.     Palpations: There is no mass.     Tenderness: There is no abdominal tenderness. There is no guarding.     Hernia: No hernia is present.  Musculoskeletal:         General: Normal range of motion.     Cervical back: Neck supple.     Right lower leg: No edema.     Left lower leg: No edema.  Lymphadenopathy:     Cervical: No cervical adenopathy.  Skin:    General: Skin is warm and dry.  Neurological:     General: No focal deficit present.     Mental Status: She is alert.  Psychiatric:        Mood and Affect: Mood normal.        Behavior: Behavior normal.    Lab Results  Component Value Date   WBC 10.9 (H) 08/27/2021   HGB 12.5 08/27/2021   HCT 38.5 08/27/2021   PLT 386.0 08/27/2021   GLUCOSE 102 (H) 08/27/2021   CHOL 147 10/23/2020   TRIG 84.0 10/23/2020   HDL 35.10 (L) 10/23/2020   LDLCALC 95 10/23/2020   ALT 12 10/23/2020   AST 14 10/23/2020   NA 140 08/27/2021   K 3.1 (L) 08/27/2021   CL 98 08/27/2021   CREATININE 0.73 08/27/2021   BUN 18 08/27/2021   CO2 33 (H) 08/27/2021   TSH 1.17 10/23/2020   HGBA1C 6.5 08/27/2021    DG Cervical Spine 2 or 3 views  Result Date: 12/28/2019 CLINICAL DATA:  RIGHT side neck pain with RIGHT arm and hand numbness for 6 months, no known injury EXAM: CERVICAL SPINE - 2-3 VIEW COMPARISON:  05/12/2019 FINDINGS: Straightening of cervical lordosis question muscle spasm. Prevertebral soft tissues normal thickness. Osseous mineralization normal. Vertebral body and disc space heights maintained. No fracture, subluxation, or bone destruction. Lung apices clear. IMPRESSION: Question muscle spasm; otherwise negative exam. If patient is having radicular symptoms to the upper extremity, consider cervical spine MR assessment. Electronically Signed   By: Lavonia Dana M.D.   On: 12/28/2019 12:01   DG Lumbar Spine 2-3 Views  Result Date: 12/28/2019 CLINICAL DATA:  RIGHT-side low back pain radiating to leg and foot for 1 year, no known injury EXAM: LUMBAR SPINE - 2-3 VIEW COMPARISON:  None Correlation: MR lumbar spine 12/28/2017 FINDINGS: Five non-rib-bearing lumbar vertebra. Osseous mineralization normal. Mild  degenerative disc disease changes at T11-T12 with minimal endplate sclerosis, spurring and question superior endplate height loss of T12 vertebral body new since 12/28/2017. Vertebral body heights otherwise maintained. No additional fracture, subluxation, or bone destruction. No obvious spondylolysis. SI joints grossly preserved. Surgical clips project anterior to the RIGHT SI joint. IMPRESSION: Degenerative disc disease changes at T12-L1 with mild age-indeterminate superior endplate compression deformity of T12 vertebral body new since 12/28/2017. Electronically Signed   By: Lavonia Dana M.D.   On: 12/28/2019 11:59    Assessment & Plan:   Kayelynn was seen today for hypertension.  Diagnoses and all orders for this visit:  Primary hypertension- Her blood pressure is adequately well controlled.  I will treat the hypokalemia. -     Basic metabolic panel; Future -     Basic metabolic panel -     potassium chloride (KLOR-CON 10) 10 MEQ tablet; Take 1 tablet (10 mEq total) by mouth 3 (three) times daily.  OAB (overactive bladder) -     mirabegron ER (MYRBETRIQ) 50 MG TB24 tablet; Take 1 tablet (50 mg total) by mouth daily.  Vitamin B12 deficiency anemia due to intrinsic factor deficiency -     CBC with Differential/Platelet; Future -     CBC with Differential/Platelet  Folate deficiency -     CBC with Differential/Platelet; Future -     CBC with Differential/Platelet  Hyperglycemia  Iron deficiency anemia due to chronic blood loss- Her H&H and iron levels are normal now. -     CBC with Differential/Platelet; Future -     IBC + Ferritin; Future -     IBC + Ferritin -     CBC with Differential/Platelet  Prediabetes -     Basic metabolic panel; Future -     Hemoglobin A1c; Future -     Hemoglobin A1c -     Basic metabolic panel  Type II diabetes mellitus with manifestations ([place])- I recommended she treat this with a GLP/GIP agonist. -     tirzepatide (MOUNJARO) 2.5 MG/0.5ML Pen; Inject  2.5 mg into the skin once a week.  Diuretic-induced hypokalemia -     potassium chloride (KLOR-CON 10) 10 MEQ tablet; Take 1 tablet (10 mEq total) by mouth 3 (three) times daily.  Other orders -     Flu Vaccine QUAD 6+ mos PF IM (Fluarix Quad PF)  I am having Gail Payne start on tirzepatide and potassium chloride. I am also having her maintain her Albuterol Sulfate, Vitamin D, norethindrone, aspirin, Docusate Calcium (STOOL SOFTENER PO), B-D 3CC LUER-LOK SYR 25GX5/8", Ubrelvy, omeprazole, FeroSul, folic acid, cyanocobalamin, indapamide, and mirabegron ER.  Meds ordered this encounter  Medications   mirabegron ER (MYRBETRIQ) 50 MG TB24 tablet    Sig: Take 1 tablet (50 mg total) by mouth daily.    Dispense:  90 tablet    Refill:  1   tirzepatide (MOUNJARO) 2.5 MG/0.5ML Pen    Sig: Inject 2.5 mg into the skin once a week.    Dispense:  2 mL    Refill:  0   potassium chloride (KLOR-CON 10) 10 MEQ tablet    Sig: Take 1 tablet (10 mEq total) by mouth 3 (three) times daily.    Dispense:  270 tablet    Refill:  1     Follow-up: Return in about 3 months (around 11/26/2021).  Scarlette Calico, MD

## 2021-08-28 DIAGNOSIS — E118 Type 2 diabetes mellitus with unspecified complications: Secondary | ICD-10-CM | POA: Insufficient documentation

## 2021-08-28 DIAGNOSIS — E876 Hypokalemia: Secondary | ICD-10-CM | POA: Insufficient documentation

## 2021-08-28 DIAGNOSIS — T502X5A Adverse effect of carbonic-anhydrase inhibitors, benzothiadiazides and other diuretics, initial encounter: Secondary | ICD-10-CM | POA: Insufficient documentation

## 2021-08-28 MED ORDER — POTASSIUM CHLORIDE ER 10 MEQ PO TBCR: 10.0000 meq | EXTENDED_RELEASE_TABLET | Freq: Three times a day (TID) | ORAL | 1 refills | Status: DC

## 2021-08-29 DIAGNOSIS — I10 Essential (primary) hypertension: Secondary | ICD-10-CM | POA: Diagnosis not present

## 2021-08-29 DIAGNOSIS — E1165 Type 2 diabetes mellitus with hyperglycemia: Secondary | ICD-10-CM | POA: Diagnosis not present

## 2021-08-30 ENCOUNTER — Other Ambulatory Visit: Payer: Self-pay | Admitting: Internal Medicine

## 2021-09-05 DIAGNOSIS — E1165 Type 2 diabetes mellitus with hyperglycemia: Secondary | ICD-10-CM | POA: Diagnosis not present

## 2021-09-05 DIAGNOSIS — Z23 Encounter for immunization: Secondary | ICD-10-CM

## 2021-09-05 DIAGNOSIS — I10 Essential (primary) hypertension: Secondary | ICD-10-CM | POA: Diagnosis not present

## 2021-09-27 DIAGNOSIS — E1165 Type 2 diabetes mellitus with hyperglycemia: Secondary | ICD-10-CM | POA: Diagnosis not present

## 2021-09-27 DIAGNOSIS — I10 Essential (primary) hypertension: Secondary | ICD-10-CM | POA: Diagnosis not present

## 2021-10-06 ENCOUNTER — Other Ambulatory Visit: Payer: Self-pay | Admitting: Internal Medicine

## 2021-11-05 DIAGNOSIS — M7711 Lateral epicondylitis, right elbow: Secondary | ICD-10-CM | POA: Diagnosis not present

## 2021-11-05 DIAGNOSIS — G5603 Carpal tunnel syndrome, bilateral upper limbs: Secondary | ICD-10-CM | POA: Diagnosis not present

## 2021-11-21 ENCOUNTER — Telehealth: Payer: BC Managed Care – PPO | Admitting: Physician Assistant

## 2021-11-21 DIAGNOSIS — J040 Acute laryngitis: Secondary | ICD-10-CM | POA: Diagnosis not present

## 2021-11-21 MED ORDER — METHYLPREDNISOLONE 4 MG PO TBPK: ORAL_TABLET | ORAL | 0 refills | Status: DC

## 2021-11-21 NOTE — Progress Notes (Signed)
Virtual Visit Consent   ANKITA NEWCOMER, you are scheduled for a virtual visit with a [place] provider today.     Just as with appointments in the office, your consent must be obtained to participate.  Your consent will be active for this visit and any virtual visit you may have with one of our providers in the next 365 days.     If you have a MyChart account, a copy of this consent can be sent to you electronically.  All virtual visits are billed to your insurance company just like a traditional visit in the office.    As this is a virtual visit, video technology does not allow for your provider to perform a traditional examination.  This may limit your provider's ability to fully assess your condition.  If your provider identifies any concerns that need to be evaluated in person or the need to arrange testing (such as labs, EKG, etc.), we will make arrangements to do so.     Although advances in technology are sophisticated, we cannot ensure that it will always work on either your end or our end.  If the connection with a video visit is poor, the visit may have to be switched to a telephone visit.  With either a video or telephone visit, we are not always able to ensure that we have a secure connection.     I need to obtain your verbal consent now.   Are you willing to proceed with your visit today?    IVIE SAVITT has provided verbal consent on 11/21/2021 for a virtual visit (video or telephone).   Leeanne Rio, [place]   Date: 11/21/2021 9:59 AM   Virtual Visit via Video Note   I, Leeanne Rio, connected with  GISELL BUEHRLE  ([id number], 06/11/1971) on 11/21/21 at  9:45 AM EST by a video-enabled telemedicine application and verified that I am speaking with the correct person using two identifiers.  Location: Patient: Virtual Visit Location Patient: Home Provider: Virtual Visit Location Provider: Home Office   I discussed the limitations of evaluation and  management by telemedicine and the availability of in person appointments. The patient expressed understanding and agreed to proceed.    History of Present Illness: NILLIE BARTOLOTTA is a 50 y.o. who identifies as a female who was assigned female at birth, and is being seen today for hoarseness of voice starting a few days ago. Initially with mild nasal congestion and PND that resolved after first day. Denies any sore throat, cough, sinus congestion, chest congestion, fever, chills, malaise or fatigue. Denies recent travel or sick contact. Denies recent environmental change.  HPI: HPI  Problems:  Patient Active Problem List   Diagnosis Date Noted   Diuretic-induced hypokalemia 08/28/2021   Type II diabetes mellitus with manifestations ([place]) 08/28/2021   Prediabetes 08/27/2021   Primary hypertension 10/23/2020   Bursitis of right shoulder 75/09/2584   Systolic murmur 27/78/2423   Right lumbar radiculitis 12/17/2017   Hidradenitis suppurativa of right axilla 03/17/2017   OAB (overactive bladder) 06/29/2016   Upper airway cough syndrome 02/24/2015   Hyperglycemia 08/27/2013   Morbid obesity with BMI of 40.0-44.9, adult ([place]) 08/27/2013   Back pain, thoracic 08/14/2012   Routine general medical examination at a health care facility 01/31/2012   Carpal tunnel syndrome of right wrist 07/26/2011   B12 deficiency anemia 07/10/2011   DJD (degenerative joint disease) of knee 07/10/2011   Folate deficiency 01/04/2011   Iron deficiency  anemia 09/08/2008   GERD 09/08/2008   Moderate persistent asthma 10/16/2007    Allergies:  Allergies  Allergen Reactions   Latex    Medications:  Current Outpatient Medications:    methylPREDNISolone (MEDROL DOSEPAK) 4 MG TBPK tablet, Take following package directions., Disp: 21 tablet, Rfl: 0   Albuterol Sulfate (PROAIR RESPICLICK) 149 (90 Base) MCG/ACT AEPB, Inhale 1 puff into the lungs 4 (four) times daily as needed., Disp: 1 each, Rfl: 11   aspirin 81 MG  chewable tablet, Chew by mouth daily., Disp: , Rfl:    B-D 3CC LUER-LOK SYR 25GX5/8" 25G X 5/8" 3 ML MISC, USE TO INJECT B-12 MONTHLY, Disp: 3 each, Rfl: 3   Cholecalciferol (VITAMIN D) 50 MCG (2000 UT) CAPS, Take 1 capsule (2,000 Units total) by mouth daily., Disp: 30 capsule, Rfl: 5   cyanocobalamin (,VITAMIN B-12,) 1000 MCG/ML injection, USE TO INJECT 1 ML (1 MG) INTO THE MUSCLE EVERY 30 DAYS, Disp: 3 mL, Rfl: 0   Docusate Calcium (STOOL SOFTENER PO), Take by mouth daily. With Iron daily, Disp: , Rfl:    FEROSUL 325 (65 Fe) MG tablet, TAKE 1 TABLET TWICE A DAY WITH MEALS, Disp: 180 tablet, Rfl: 0   folic acid (FOLVITE) 1 MG tablet, TAKE 1 TABLET DAILY, Disp: 90 tablet, Rfl: 0   mirabegron ER (MYRBETRIQ) 50 MG TB24 tablet, Take 1 tablet (50 mg total) by mouth daily., Disp: 90 tablet, Rfl: 1   norethindrone (MICRONOR) 0.35 MG tablet, Take 1 tablet (0.35 mg total) by mouth daily., Disp: 1 Package, Rfl: 11   omeprazole (PRILOSEC) 40 MG capsule, TAKE 1 CAPSULE DAILY, Disp: 90 capsule, Rfl: 1   tirzepatide (MOUNJARO) 2.5 MG/0.5ML Pen, Inject 2.5 mg into the skin once a week., Disp: 2 mL, Rfl: 0   UBRELVY 100 MG TABS, TAKE 1 TABLET BY MOUTH DAILY AS NEEDED, Disp: 8 tablet, Rfl: 2  Observations/Objective: Patient is well-developed, well-nourished in no acute distress.  Resting comfortably at home.  Head is normocephalic, atraumatic.  No labored breathing. Speech is coherent with logical content.  Mild to moderate hoarseness appreciated. Patient is alert and oriented at baseline.   Assessment and Plan: 1. Acute laryngitis - methylPREDNISolone (MEDROL DOSEPAK) 4 MG TBPK tablet; Take following package directions.  Dispense: 21 tablet; Refill: 0 Suspect viral etiology giving initial nasal congestion and PND that have resolved. Supportive measures and OTC medications reviewed. Discussed importance of good hydration and voice rest. Keep humidifier in the bedroom. Will Rx medrol dose pack to help reduce  inflammation int he vocal cords and hopefully help speed recovery. She is Type II diabetic but Last A1c at 6.5 so ok to prescribe steroid.   Follow Up Instructions: I discussed the assessment and treatment plan with the patient. The patient was provided an opportunity to ask questions and all were answered. The patient agreed with the plan and demonstrated an understanding of the instructions.  A copy of instructions were sent to the patient via MyChart unless otherwise noted below.   The patient was advised to call back or seek an in-person evaluation if the symptoms worsen or if the condition fails to improve as anticipated.  Time:  I spent 15 minutes with the patient via telehealth technology discussing the above problems/concerns.    Leeanne Rio, PA-C

## 2021-11-21 NOTE — Patient Instructions (Signed)
Gail Payne, thank you for joining Leeanne Rio, PA-C for today's virtual visit.  While this provider is not your primary care provider (PCP), if your PCP is located in our provider database this encounter information will be shared with them immediately following your visit.  Consent: (Patient) Gail Payne provided verbal consent for this virtual visit at the beginning of the encounter.  Current Medications:  Current Outpatient Medications:    Albuterol Sulfate (PROAIR RESPICLICK) 242 (90 Base) MCG/ACT AEPB, Inhale 1 puff into the lungs 4 (four) times daily as needed., Disp: 1 each, Rfl: 11   aspirin 81 MG chewable tablet, Chew by mouth daily., Disp: , Rfl:    B-D 3CC LUER-LOK SYR 25GX5/8" 25G X 5/8" 3 ML MISC, USE TO INJECT B-12 MONTHLY, Disp: 3 each, Rfl: 3   Cholecalciferol (VITAMIN D) 50 MCG (2000 UT) CAPS, Take 1 capsule (2,000 Units total) by mouth daily., Disp: 30 capsule, Rfl: 5   cyanocobalamin (,VITAMIN B-12,) 1000 MCG/ML injection, USE TO INJECT 1 ML (1 MG) INTO THE MUSCLE EVERY 30 DAYS, Disp: 3 mL, Rfl: 0   Docusate Calcium (STOOL SOFTENER PO), Take by mouth daily. With Iron daily, Disp: , Rfl:    FEROSUL 325 (65 Fe) MG tablet, TAKE 1 TABLET TWICE A DAY WITH MEALS, Disp: 180 tablet, Rfl: 0   folic acid (FOLVITE) 1 MG tablet, TAKE 1 TABLET DAILY, Disp: 90 tablet, Rfl: 0   indapamide (LOZOL) 1.25 MG tablet, TAKE 1 TABLET DAILY, Disp: 90 tablet, Rfl: 0   mirabegron ER (MYRBETRIQ) 50 MG TB24 tablet, Take 1 tablet (50 mg total) by mouth daily., Disp: 90 tablet, Rfl: 1   norethindrone (MICRONOR) 0.35 MG tablet, Take 1 tablet (0.35 mg total) by mouth daily., Disp: 1 Package, Rfl: 11   omeprazole (PRILOSEC) 40 MG capsule, TAKE 1 CAPSULE DAILY, Disp: 90 capsule, Rfl: 1   potassium chloride (KLOR-CON 10) 10 MEQ tablet, Take 1 tablet (10 mEq total) by mouth 3 (three) times daily., Disp: 270 tablet, Rfl: 1   tirzepatide (MOUNJARO) 2.5 MG/0.5ML Pen, Inject 2.5 mg into the skin  once a week., Disp: 2 mL, Rfl: 0   UBRELVY 100 MG TABS, TAKE 1 TABLET BY MOUTH DAILY AS NEEDED, Disp: 8 tablet, Rfl: 2   Medications ordered in this encounter:  No orders of the defined types were placed in this encounter.    *If you need refills on other medications prior to your next appointment, please contact your pharmacy*  Follow-Up: Call back or seek an in-person evaluation if the symptoms worsen or if the condition fails to improve as anticipated.  Other Instructions Please keep well-hydrated and continue to rest your voice.  Run the humidifier in the bedroom at night. Take the steroid pack as directed. If symptoms are not resolving or you note new or worsening symptoms, we want you to be evaluated in person.   Please do not delay care!   If you have been instructed to have an in-person evaluation today at a local Urgent Care facility, please use the link below. It will take you to a list of all of our available [facility], including address, phone number and hours of operation. Please do not delay care.  [facility]  If you or a family member do not have a primary care provider, use the link below to schedule a visit and establish care. When you choose a  primary care physician or advanced practice provider, you  gain a long-term partner in health. Find a Primary Care Provider  Learn more about [facility]'s in-office and virtual care options: [facility]

## 2021-11-29 DIAGNOSIS — I10 Essential (primary) hypertension: Secondary | ICD-10-CM | POA: Diagnosis not present

## 2021-11-29 DIAGNOSIS — E1165 Type 2 diabetes mellitus with hyperglycemia: Secondary | ICD-10-CM | POA: Diagnosis not present

## 2021-11-29 DIAGNOSIS — Z23 Encounter for immunization: Secondary | ICD-10-CM | POA: Diagnosis not present

## 2021-11-29 LAB — HEMOGLOBIN A1C: Hemoglobin A1C: 5.1

## 2021-12-06 DIAGNOSIS — G5603 Carpal tunnel syndrome, bilateral upper limbs: Secondary | ICD-10-CM | POA: Diagnosis not present

## 2021-12-11 ENCOUNTER — Encounter: Payer: Self-pay | Admitting: Neurology

## 2021-12-11 ENCOUNTER — Other Ambulatory Visit: Payer: Self-pay

## 2021-12-11 DIAGNOSIS — R202 Paresthesia of skin: Secondary | ICD-10-CM

## 2021-12-15 DIAGNOSIS — M25521 Pain in right elbow: Secondary | ICD-10-CM | POA: Diagnosis not present

## 2021-12-19 DIAGNOSIS — E1165 Type 2 diabetes mellitus with hyperglycemia: Secondary | ICD-10-CM | POA: Diagnosis not present

## 2021-12-19 DIAGNOSIS — M25521 Pain in right elbow: Secondary | ICD-10-CM | POA: Diagnosis not present

## 2021-12-19 DIAGNOSIS — I10 Essential (primary) hypertension: Secondary | ICD-10-CM | POA: Diagnosis not present

## 2021-12-25 ENCOUNTER — Other Ambulatory Visit: Payer: Self-pay

## 2021-12-25 ENCOUNTER — Ambulatory Visit: Payer: BC Managed Care – PPO | Admitting: Neurology

## 2021-12-25 DIAGNOSIS — R202 Paresthesia of skin: Secondary | ICD-10-CM | POA: Diagnosis not present

## 2021-12-25 DIAGNOSIS — G5603 Carpal tunnel syndrome, bilateral upper limbs: Secondary | ICD-10-CM

## 2021-12-25 NOTE — Procedures (Signed)
[facility]  [address]  [place], [place] [address] Tel: [phone] Fax:  [phone] Test Date:  12/25/2021  Patient: Gail Payne DOB: 15-Jul-1971 Physician: Narda Amber, DO  Sex: Female Height: 5\' 5"  Ref Phys: Matthew Saras, [facility]  ID#: [id number]   Technician:    Patient Complaints: This is a 51 year old female referred for evaluation of bilateral hand paresthesias.  NCV & EMG Findings: Extensive electrodiagnostic testing of the right upper extremity and additional studies of the left shows:  Bilateral median sensory responses show prolonged latency (R4.4, L5.9 ms).  Bilateral ulnar sensory responses are within normal limits. Left median motor response shows prolonged latency (5.2 ms).  Right median and bilateral ulnar motor responses are within normal limits.   There is no evidence of active or chronic motor axonal loss changes affecting any of the tested muscles.  Motor unit configuration and recruitment pattern is within normal limits.  Impression: Bilateral median neuropathy at or distal to the wrist, consistent with a clinical diagnosis of carpal tunnel syndrome.  Overall, these findings are moderate in degree electrically and worse on the left.   ___________________________ Narda Amber, DO    Nerve Conduction Studies Anti Sensory Summary Table   Stim Site NR Peak (ms) Norm Peak (ms) P-T Amp (V) Norm P-T Amp  Left Median Anti Sensory (2nd Digit)  33C  Wrist    5.9 <3.6 18.5 >15  Right Median Anti Sensory (2nd Digit)  33C  Wrist    4.4 <3.6 23.1 >15  Left Ulnar Anti Sensory (5th Digit)  33C  Wrist    2.8 <3.1 41.4 >10  Right Ulnar Anti Sensory (5th Digit)  33C  Wrist    2.6 <3.1 36.1 >10   Motor Summary Table   Stim Site NR Onset (ms) Norm Onset (ms) O-P Amp (mV) Norm O-P Amp Site1 Site2 Delta-0 (ms) Dist (cm) Vel (m/s) Norm Vel (m/s)  Left Median Motor (Abd Poll Brev)  33C  Wrist    5.2 <4.0 11.3 >6 Elbow Wrist 5.2 29.0 56  >50  Elbow    10.4  10.7         Right Median Motor (Abd Poll Brev)  33C  Wrist    3.8 <4.0 11.0 >6 Elbow Wrist 4.9 29.0 59 >50  Elbow    8.7  10.9         Left Ulnar Motor (Abd Dig Minimi)  33C  Wrist    2.3 <3.1 10.8 >7 B Elbow Wrist 3.7 23.0 62 >50  B Elbow    6.0  10.2  A Elbow B Elbow 1.6 10.0 63 >50  A Elbow    7.6  10.2         Right Ulnar Motor (Abd Dig Minimi)  33C  Wrist    2.1 <3.1 10.2 >7 B Elbow Wrist 3.7 22.0 59 >50  B Elbow    5.8  9.4  A Elbow B Elbow 1.6 10.0 62 >50  A Elbow    7.4  9.2          EMG   Side Muscle Ins Act Fibs Psw Fasc Number Recrt Dur Dur. Amp Amp. Poly Poly. Comment  Right 1stDorInt Nml Nml Nml Nml Nml Nml Nml Nml Nml Nml Nml Nml N/A  Right Abd Poll Brev Nml Nml Nml Nml Nml Nml Nml Nml Nml Nml Nml Nml N/A  Right PronatorTeres Nml Nml Nml Nml Nml Nml Nml Nml Nml Nml Nml Nml N/A  Right  Biceps Nml Nml Nml Nml Nml Nml Nml Nml Nml Nml Nml Nml N/A  Right Triceps Nml Nml Nml Nml Nml Nml Nml Nml Nml Nml Nml Nml N/A  Right Deltoid Nml Nml Nml Nml Nml Nml Nml Nml Nml Nml Nml Nml N/A  Left 1stDorInt Nml Nml Nml Nml Nml Nml Nml Nml Nml Nml Nml Nml N/A  Left Abd Poll Brev Nml Nml Nml Nml Nml Nml Nml Nml Nml Nml Nml Nml N/A  Left PronatorTeres Nml Nml Nml Nml Nml Nml Nml Nml Nml Nml Nml Nml N/A  Left Biceps Nml Nml Nml Nml Nml Nml Nml Nml Nml Nml Nml Nml N/A  Left Triceps Nml Nml Nml Nml Nml Nml Nml Nml Nml Nml Nml Nml N/A  Left Deltoid Nml Nml Nml Nml Nml Nml Nml Nml Nml Nml Nml Nml N/A      Waveforms:

## 2022-01-01 ENCOUNTER — Encounter: Payer: BC Managed Care – PPO | Admitting: Neurology

## 2022-01-10 DIAGNOSIS — G5603 Carpal tunnel syndrome, bilateral upper limbs: Secondary | ICD-10-CM | POA: Diagnosis not present

## 2022-01-22 DIAGNOSIS — Z6839 Body mass index (BMI) 39.0-39.9, adult: Secondary | ICD-10-CM | POA: Diagnosis not present

## 2022-01-22 DIAGNOSIS — Z1231 Encounter for screening mammogram for malignant neoplasm of breast: Secondary | ICD-10-CM | POA: Diagnosis not present

## 2022-01-22 DIAGNOSIS — Z124 Encounter for screening for malignant neoplasm of cervix: Secondary | ICD-10-CM | POA: Diagnosis not present

## 2022-01-22 DIAGNOSIS — Z01419 Encounter for gynecological examination (general) (routine) without abnormal findings: Secondary | ICD-10-CM | POA: Diagnosis not present

## 2022-01-22 DIAGNOSIS — Z1151 Encounter for screening for human papillomavirus (HPV): Secondary | ICD-10-CM | POA: Diagnosis not present

## 2022-02-07 DIAGNOSIS — G5603 Carpal tunnel syndrome, bilateral upper limbs: Secondary | ICD-10-CM | POA: Diagnosis not present

## 2022-02-13 DIAGNOSIS — Z23 Encounter for immunization: Secondary | ICD-10-CM | POA: Diagnosis not present

## 2022-02-27 ENCOUNTER — Telehealth: Payer: Self-pay | Admitting: Internal Medicine

## 2022-02-27 NOTE — Telephone Encounter (Signed)
1.Medication Requested: omeprazole (PRILOSEC) 40 MG capsule ? ?2. Pharmacy (Name, [facility]): [place], [place] [address] ? ?3. On Med List: Y ? ?4. Last Visit with PCP: 08-27-2021 ? ?5. Next visit date with PCP: n/a ? ? ?Agent: Please be advised that RX refills may take up to 3 business days. We ask that you follow-up with your pharmacy.  ?

## 2022-02-27 NOTE — Telephone Encounter (Signed)
Pt scheduled for 3/27 @ 9.20am ?

## 2022-03-04 ENCOUNTER — Encounter: Payer: Self-pay | Admitting: Internal Medicine

## 2022-03-04 ENCOUNTER — Ambulatory Visit: Payer: BC Managed Care – PPO | Admitting: Internal Medicine

## 2022-03-04 ENCOUNTER — Other Ambulatory Visit: Payer: Self-pay

## 2022-03-04 VITALS — BP 120/74 | HR 73 | Temp 98.0°F | Ht 65.0 in | Wt 237.0 lb

## 2022-03-04 DIAGNOSIS — Z6841 Body Mass Index (BMI) 40.0 and over, adult: Secondary | ICD-10-CM

## 2022-03-04 DIAGNOSIS — D510 Vitamin B12 deficiency anemia due to intrinsic factor deficiency: Secondary | ICD-10-CM

## 2022-03-04 DIAGNOSIS — I10 Essential (primary) hypertension: Secondary | ICD-10-CM

## 2022-03-04 DIAGNOSIS — J4540 Moderate persistent asthma, uncomplicated: Secondary | ICD-10-CM | POA: Diagnosis not present

## 2022-03-04 DIAGNOSIS — E118 Type 2 diabetes mellitus with unspecified complications: Secondary | ICD-10-CM | POA: Diagnosis not present

## 2022-03-04 DIAGNOSIS — Z6837 Body mass index (BMI) 37.0-37.9, adult: Secondary | ICD-10-CM

## 2022-03-04 DIAGNOSIS — Z0000 Encounter for general adult medical examination without abnormal findings: Secondary | ICD-10-CM

## 2022-03-04 DIAGNOSIS — E669 Obesity, unspecified: Secondary | ICD-10-CM

## 2022-03-04 DIAGNOSIS — E538 Deficiency of other specified B group vitamins: Secondary | ICD-10-CM

## 2022-03-04 DIAGNOSIS — D500 Iron deficiency anemia secondary to blood loss (chronic): Secondary | ICD-10-CM

## 2022-03-04 DIAGNOSIS — K219 Gastro-esophageal reflux disease without esophagitis: Secondary | ICD-10-CM | POA: Diagnosis not present

## 2022-03-04 DIAGNOSIS — T502X5A Adverse effect of carbonic-anhydrase inhibitors, benzothiadiazides and other diuretics, initial encounter: Secondary | ICD-10-CM

## 2022-03-04 DIAGNOSIS — E876 Hypokalemia: Secondary | ICD-10-CM

## 2022-03-04 LAB — HEPATIC FUNCTION PANEL
ALT: 17 U/L (ref 0–35)
AST: 16 U/L (ref 0–37)
Albumin: 4.2 g/dL (ref 3.5–5.2)
Alkaline Phosphatase: 71 U/L (ref 39–117)
Bilirubin, Direct: 0.1 mg/dL (ref 0.0–0.3)
Total Bilirubin: 0.6 mg/dL (ref 0.2–1.2)
Total Protein: 7.6 g/dL (ref 6.0–8.3)

## 2022-03-04 LAB — BASIC METABOLIC PANEL
BUN: 22 mg/dL (ref 6–23)
CO2: 29 mEq/L (ref 19–32)
Calcium: 9.6 mg/dL (ref 8.4–10.5)
Chloride: 103 mEq/L (ref 96–112)
Creatinine, Ser: 0.83 mg/dL (ref 0.40–1.20)
GFR: 81.83 mL/min (ref >60.00)
Glucose, Bld: 93 mg/dL (ref 70–99)
Potassium: 4.2 mEq/L (ref 3.5–5.1)
Sodium: 138 mEq/L (ref 135–145)

## 2022-03-04 LAB — URINALYSIS, ROUTINE W REFLEX MICROSCOPIC
Bilirubin Urine: NEGATIVE
Hgb urine dipstick: NEGATIVE
Ketones, ur: NEGATIVE
Leukocytes,Ua: NEGATIVE
Nitrite: NEGATIVE
RBC / HPF: NONE SEEN (ref 0–?)
Renal Epithel, UA: NONE SEEN
Specific Gravity, Urine: >=1.03 — AB (ref 1.000–1.030)
Total Protein, Urine: NEGATIVE
Urine Glucose: NEGATIVE
Urobilinogen, UA: 0.2 (ref 0.0–1.0)
pH: 5.5 (ref 5.0–8.0)

## 2022-03-04 LAB — LIPID PANEL
Cholesterol: 153 mg/dL (ref 0–200)
HDL: 41.1 mg/dL (ref >39.00)
LDL Cholesterol: 99 mg/dL (ref 0–99)
NonHDL: 112.25
Total CHOL/HDL Ratio: 4
Triglycerides: 66 mg/dL (ref 0.0–149.0)
VLDL: 13.2 mg/dL (ref 0.0–40.0)

## 2022-03-04 LAB — CBC WITH DIFFERENTIAL/PLATELET
Basophils Absolute: 0.1 10*3/uL (ref 0.0–0.1)
Basophils Relative: 0.9 % (ref 0.0–3.0)
Eosinophils Absolute: 0 10*3/uL (ref 0.0–0.7)
Eosinophils Relative: 0 % (ref 0.0–5.0)
HCT: 39 % (ref 36.0–46.0)
Hemoglobin: 12.6 g/dL (ref 12.0–15.0)
Lymphocytes Relative: 29.2 % (ref 12.0–46.0)
Lymphs Abs: 2.6 10*3/uL (ref 0.7–4.0)
MCHC: 32.2 g/dL (ref 30.0–36.0)
MCV: 87.7 fl (ref 78.0–100.0)
Monocytes Absolute: 0.6 10*3/uL (ref 0.1–1.0)
Monocytes Relative: 6.4 % (ref 3.0–12.0)
Neutro Abs: 5.7 10*3/uL (ref 1.4–7.7)
Neutrophils Relative %: 63.5 % (ref 43.0–77.0)
Platelets: 376 10*3/uL (ref 150.0–400.0)
RBC: 4.45 Mil/uL (ref 3.87–5.11)
RDW: 15.5 % (ref 11.5–15.5)
WBC: 8.9 10*3/uL (ref 4.0–10.5)

## 2022-03-04 LAB — MICROALBUMIN / CREATININE URINE RATIO
Creatinine,U: 255.9 mg/dL
Microalb Creat Ratio: 0.7 mg/g (ref 0.0–30.0)
Microalb, Ur: 1.7 mg/dL (ref 0.0–1.9)

## 2022-03-04 MED ORDER — CYANOCOBALAMIN 1000 MCG/ML IJ SOLN
1000.0000 ug | INTRAMUSCULAR | 1 refills | Status: DC
Start: 2022-03-04 — End: 2022-08-05

## 2022-03-04 MED ORDER — FOLIC ACID 1 MG PO TABS: 1.0000 mg | ORAL_TABLET | Freq: Every day | ORAL | 1 refills | Status: DC

## 2022-03-04 MED ORDER — OMEPRAZOLE 40 MG PO CPDR: 40.0000 mg | DELAYED_RELEASE_CAPSULE | Freq: Every day | ORAL | 1 refills | Status: DC

## 2022-03-04 NOTE — Progress Notes (Signed)
? ?Subjective:  ?Patient ID: Gail Payne, female    DOB: Nov 19, 1971  Age: 51 y.o. MRN: [id number] ? ?CC: Annual Exam and Diabetes ? ?This visit occurred during the SARS-CoV-2 public health emergency.  Safety protocols were in place, including screening questions prior to the visit, additional usage of staff PPE, and extensive cleaning of exam room while observing appropriate contact time as indicated for disinfecting solutions.   ? ?HPI ?[name] presents for a CPX and f/up - ? ?She exercises and does not experience chest pain, shortness of breath, diaphoresis, edema, or fatigue. ? ?Outpatient Medications Prior to Visit  ?Medication Sig Dispense Refill  ? Albuterol Sulfate (PROAIR RESPICLICK) 633 (90 Base) MCG/ACT AEPB Inhale 1 puff into the lungs 4 (four) times daily as needed. 1 each 11  ? aspirin 81 MG chewable tablet Chew by mouth daily.    ? B-D 3CC LUER-LOK SYR 25GX5/8" 25G X 5/8" 3 ML MISC USE TO INJECT B-12 MONTHLY 3 each 3  ? Cholecalciferol (VITAMIN D) 50 MCG (2000 UT) CAPS Take 1 capsule (2,000 Units total) by mouth daily. 30 capsule 5  ? Docusate Calcium (STOOL SOFTENER PO) Take by mouth daily. With Iron daily    ? FEROSUL 325 (65 Fe) MG tablet TAKE 1 TABLET TWICE A DAY WITH MEALS 180 tablet 0  ? norethindrone (MICRONOR) 0.35 MG tablet Take 1 tablet (0.35 mg total) by mouth daily. 1 Package 11  ? tirzepatide (MOUNJARO) 2.5 MG/0.5ML Pen Inject 2.5 mg into the skin once a week. 2 mL 0  ? UBRELVY 100 MG TABS TAKE 1 TABLET BY MOUTH DAILY AS NEEDED 8 tablet 2  ? cyanocobalamin (,VITAMIN B-12,) 1000 MCG/ML injection USE TO INJECT 1 ML (1 MG) INTO THE MUSCLE EVERY 30 DAYS 3 mL 0  ? folic acid (FOLVITE) 1 MG tablet TAKE 1 TABLET DAILY 90 tablet 0  ? omeprazole (PRILOSEC) 40 MG capsule TAKE 1 CAPSULE DAILY 90 capsule 1  ? methylPREDNISolone (MEDROL DOSEPAK) 4 MG TBPK tablet Take following package directions. 21 tablet 0  ? mirabegron ER (MYRBETRIQ) 50 MG TB24 tablet Take 1 tablet (50 mg total) by mouth  daily. 90 tablet 1  ? ?No facility-administered medications prior to visit.  ? ? ?ROS ?Review of Systems  ?Constitutional:  Negative for diaphoresis and fatigue.  ?HENT: Negative.    ?Eyes: Negative.   ?Respiratory:  Negative for cough, chest tightness, shortness of breath and wheezing.   ?Cardiovascular:  Negative for chest pain, palpitations and leg swelling.  ?Gastrointestinal:  Negative for abdominal pain, constipation, diarrhea, nausea and vomiting.  ?Endocrine: Negative.   ?Genitourinary: Negative.  Negative for difficulty urinating.  ?Musculoskeletal: Negative.  Negative for arthralgias and myalgias.  ?Skin: Negative.  Negative for color change and pallor.  ?Neurological: Negative.   ?Hematological:  Negative for adenopathy. Does not bruise/bleed easily.  ?Psychiatric/Behavioral: Negative.    ? ?Objective:  ?BP 120/74 (BP Location: Left Arm, Patient Position: Sitting, Cuff Size: Large)   Pulse 73   Temp 98 ?F (36.7 ?C) (Oral)   Ht '5\' 5"'$  (1.651 m)   Wt 237 lb (107.5 kg)   SpO2 97%   BMI 39.44 kg/m?  ? ?BP Readings from Last 3 Encounters:  ?03/04/22 120/74  ?08/27/21 124/82  ?02/20/21 128/74  ? ? ?Wt Readings from Last 3 Encounters:  ?03/04/22 237 lb (107.5 kg)  ?08/27/21 259 lb (117.5 kg)  ?02/20/21 267 lb (121.1 kg)  ? ? ?Physical Exam ?Vitals reviewed.  ?HENT:  ?  Nose: Nose normal.  ?   Mouth/Throat:  ?   Mouth: Mucous membranes are moist.  ?Eyes:  ?   General: No scleral icterus. ?   Conjunctiva/sclera: Conjunctivae normal.  ?Cardiovascular:  ?   Rate and Rhythm: Normal rate and regular rhythm.  ?   Heart sounds: Murmur heard.  ?Systolic murmur is present with a grade of 1/6.  ?  No gallop.  ?Pulmonary:  ?   Effort: Pulmonary effort is normal.  ?   Breath sounds: No stridor. No wheezing, rhonchi or rales.  ?Abdominal:  ?   General: Abdomen is flat.  ?   Palpations: There is no mass.  ?   Tenderness: There is no abdominal tenderness. There is no guarding.  ?   Hernia: No hernia is present.   ?Musculoskeletal:     ?   General: No swelling.  ?   Cervical back: Neck supple.  ?   Right lower leg: No edema.  ?   Left lower leg: No edema.  ?Lymphadenopathy:  ?   Cervical: No cervical adenopathy.  ?Skin: ?   General: Skin is warm and dry.  ?   Coloration: Skin is not pale.  ?Neurological:  ?   General: No focal deficit present.  ?   Mental Status: She is alert. Mental status is at baseline.  ?Psychiatric:     ?   Mood and Affect: Mood normal.     ?   Behavior: Behavior normal.  ? ? ?Lab Results  ?Component Value Date  ? WBC 8.9 03/04/2022  ? HGB 12.6 03/04/2022  ? HCT 39.0 03/04/2022  ? PLT 376.0 03/04/2022  ? GLUCOSE 93 03/04/2022  ? CHOL 153 03/04/2022  ? TRIG 66.0 03/04/2022  ? HDL 41.10 03/04/2022  ? [place] 99 03/04/2022  ? ALT 17 03/04/2022  ? AST 16 03/04/2022  ? NA 138 03/04/2022  ? K 4.2 03/04/2022  ? CL 103 03/04/2022  ? CREATININE 0.83 03/04/2022  ? BUN 22 03/04/2022  ? CO2 29 03/04/2022  ? TSH 0.50 03/04/2022  ? HGBA1C 5.1 11/29/2021  ? MICROALBUR 1.7 03/04/2022  ? ? ?DG Cervical Spine 2 or 3 views ? ?Result Date: 12/28/2019 ?CLINICAL DATA:  RIGHT side neck pain with RIGHT arm and hand numbness for 6 months, no known injury EXAM: CERVICAL SPINE - 2-3 VIEW COMPARISON:  05/12/2019 FINDINGS: Straightening of cervical lordosis question muscle spasm. Prevertebral soft tissues normal thickness. Osseous mineralization normal. Vertebral body and disc space heights maintained. No fracture, subluxation, or bone destruction. Lung apices clear. IMPRESSION: Question muscle spasm; otherwise negative exam. If patient is having radicular symptoms to the upper extremity, consider cervical spine MR assessment. Electronically Signed   By: Lavonia Dana M.D.   On: 12/28/2019 12:01  ? ?DG Lumbar Spine 2-3 Views ? ?Result Date: 12/28/2019 ?CLINICAL DATA:  RIGHT-side low back pain radiating to leg and foot for 1 year, no known injury EXAM: LUMBAR SPINE - 2-3 VIEW COMPARISON:  None Correlation: MR lumbar spine 12/28/2017  FINDINGS: Five non-rib-bearing lumbar vertebra. Osseous mineralization normal. Mild degenerative disc disease changes at T11-T12 with minimal endplate sclerosis, spurring and question superior endplate height loss of T12 vertebral body new since 12/28/2017. Vertebral body heights otherwise maintained. No additional fracture, subluxation, or bone destruction. No obvious spondylolysis. SI joints grossly preserved. Surgical clips project anterior to the RIGHT SI joint. IMPRESSION: Degenerative disc disease changes at T12-L1 with mild age-indeterminate superior endplate compression deformity of T12 vertebral body new since 12/28/2017. Electronically  Signed   By: Lavonia Dana M.D.   On: 12/28/2019 11:59  ? ? ?Assessment & Plan:  ? ?Rania was seen today for annual exam and diabetes. ? ?Diagnoses and all orders for this visit: ? ?Primary hypertension- Her blood pressure is adequately well controlled. ?-     Basic metabolic panel; Future ?-     CBC with Differential/Platelet; Future ?-     TSH; Future ?-     Urinalysis, Routine w reflex microscopic; Future ?-     Hepatic function panel; Future ?-     Hepatic function panel ?-     Urinalysis, Routine w reflex microscopic ?-     TSH ?-     CBC with Differential/Platelet ?-     Basic metabolic panel ? ?Gastroesophageal reflux disease without esophagitis- Her symptoms are well controlled. ?-     omeprazole (PRILOSEC) 40 MG capsule; Take 1 capsule (40 mg total) by mouth daily. ?-     CBC with Differential/Platelet; Future ?-     CBC with Differential/Platelet ? ?Moderate persistent asthma without complication ? ?Type II diabetes mellitus with manifestations ([place])- Her blood sugar is very well controlled. ?-     Hepatic function panel; Future ?-     Cancel: Hemoglobin A1c; Future ?-     Microalbumin / creatinine urine ratio; Future ?-     Microalbumin / creatinine urine ratio ?-     Hepatic function panel ? ?Vitamin B12 deficiency anemia due to intrinsic factor deficiency ?-      CBC with Differential/Platelet; Future ?-     Cancel: Folate; Future ?-     cyanocobalamin (,VITAMIN B-12,) 1000 MCG/ML injection; Inject 1 mL (1,000 mcg total) into the muscle every 30 (thirty) days. ?-     CBC with D

## 2022-03-04 NOTE — Patient Instructions (Signed)

## 2022-03-05 LAB — TSH: TSH: 0.5 u[IU]/mL (ref 0.35–5.50)

## 2022-03-25 ENCOUNTER — Telehealth: Payer: BC Managed Care – PPO | Admitting: Nurse Practitioner

## 2022-03-25 DIAGNOSIS — L719 Rosacea, unspecified: Secondary | ICD-10-CM | POA: Diagnosis not present

## 2022-03-25 MED ORDER — DOXYCYCLINE HYCLATE 100 MG PO TABS: 100.0000 mg | ORAL_TABLET | Freq: Two times a day (BID) | ORAL | 0 refills | Status: AC

## 2022-03-25 NOTE — Progress Notes (Signed)
?Virtual Visit Consent  ? ?Gail Payne, you are scheduled for a virtual visit with a Gail Payne today.   ?  ?Just as with appointments in the office, your consent must be obtained to participate.  Your consent will be active for this visit and any virtual visit you may have with one of our providers in the next 365 days.   ?  ?If you have a MyChart account, a copy of this consent can be sent to you electronically.  All virtual visits are billed to your insurance company just like a traditional visit in the office.   ? ?As this is a virtual visit, video technology does not allow for your Payne to perform a traditional examination.  This may limit your Payne's ability to fully assess your condition.  If your Payne identifies any concerns that need to be evaluated in person or the need to arrange testing (such as labs, EKG, etc.), we will make arrangements to do so.   ?  ?Although advances in technology are sophisticated, we cannot ensure that it will always work on either your end or our end.  If the connection with a video visit is poor, the visit may have to be switched to a telephone visit.  With either a video or telephone visit, we are not always able to ensure that we have a secure connection.    ? ?I need to obtain your verbal consent now.   Are you willing to proceed with your visit today?  ?  ?Gail Payne has provided verbal consent on 03/25/2022 for a virtual visit (video or telephone). ?  ?Gail Schneiders, FNP  ? ?Date: 03/25/2022 8:46 AM ? ? ?Virtual Visit via Video Note  ? ?IApolonio Payne, connected with  Gail Payne  ([id number], 12/18/1970) on 03/25/22 at  8:45 AM EDT by a video-enabled telemedicine application and verified that I am speaking with the correct person using two identifiers. ? ?Location: ?Patient: Virtual Visit Location Patient: Home ?Payne: Virtual Visit Location Payne: Home Office ?  ?I discussed the limitations of evaluation and management by  telemedicine and the availability of in person appointments. The patient expressed understanding and agreed to proceed.   ? ?History of Present Illness: ?Gail Payne is a 51 y.o. who identifies as a female who was assigned female at birth, and is being seen today with a rash on her face that appeared about two weeks ago.  ?She used her eczema cream initially for relief and yesterday she started to have itching. Her cheeks have been getting red as well.  ? ? ?Problems:  ?Patient Active Problem List  ? Diagnosis Date Noted  ? Diuretic-induced hypokalemia 08/28/2021  ? Type II diabetes mellitus with manifestations ([place]) 08/28/2021  ? Primary hypertension 10/23/2020  ? Bursitis of right shoulder 08/23/2019  ? Systolic murmur 75/44/9201  ? Right lumbar radiculitis 12/17/2017  ? Hidradenitis suppurativa of right axilla 03/17/2017  ? OAB (overactive bladder) 06/29/2016  ? Upper airway cough syndrome 02/24/2015  ? Morbid obesity with BMI of 40.0-44.9, adult ([place]) 08/27/2013  ? Back pain, thoracic 08/14/2012  ? Routine general medical examination at a health care facility 01/31/2012  ? Carpal tunnel syndrome of right wrist 07/26/2011  ? B12 deficiency anemia 07/10/2011  ? DJD (degenerative joint disease) of knee 07/10/2011  ? Folate deficiency 01/04/2011  ? Iron deficiency anemia 09/08/2008  ? GERD 09/08/2008  ? Moderate persistent asthma 10/16/2007  ?  ?Allergies:  ?Allergies  ?Allergen  Reactions  ? Latex   ? ?Medications:  ?Current Outpatient Medications:  ?  Albuterol Sulfate (PROAIR RESPICLICK) 017 (90 Base) MCG/ACT AEPB, Inhale 1 puff into the lungs 4 (four) times daily as needed., Disp: 1 each, Rfl: 11 ?  aspirin 81 MG chewable tablet, Chew by mouth daily., Disp: , Rfl:  ?  B-D 3CC LUER-LOK SYR 25GX5/8" 25G X 5/8" 3 ML MISC, USE TO INJECT B-12 MONTHLY, Disp: 3 each, Rfl: 3 ?  Cholecalciferol (VITAMIN D) 50 MCG (2000 UT) CAPS, Take 1 capsule (2,000 Units total) by mouth daily., Disp: 30 capsule, Rfl: 5 ?   cyanocobalamin (,VITAMIN B-12,) 1000 MCG/ML injection, Inject 1 mL (1,000 mcg total) into the muscle every 30 (thirty) days., Disp: 3 mL, Rfl: 1 ?  Docusate Calcium (STOOL SOFTENER PO), Take by mouth daily. With Iron daily, Disp: , Rfl:  ?  FEROSUL 325 (65 Fe) MG tablet, TAKE 1 TABLET TWICE A DAY WITH MEALS, Disp: 180 tablet, Rfl: 0 ?  folic acid (FOLVITE) 1 MG tablet, Take 1 tablet (1 mg total) by mouth daily., Disp: 90 tablet, Rfl: 1 ?  norethindrone (MICRONOR) 0.35 MG tablet, Take 1 tablet (0.35 mg total) by mouth daily., Disp: 1 Package, Rfl: 11 ?  omeprazole (PRILOSEC) 40 MG capsule, Take 1 capsule (40 mg total) by mouth daily., Disp: 90 capsule, Rfl: 1 ?  tirzepatide (MOUNJARO) 2.5 MG/0.5ML Pen, Inject 2.5 mg into the skin once a week., Disp: 2 mL, Rfl: 0 ?  UBRELVY 100 MG TABS, TAKE 1 TABLET BY MOUTH DAILY AS NEEDED, Disp: 8 tablet, Rfl: 2 ? ?Observations/Objective: ?Patient is well-developed, well-nourished in no acute distress.  ?Resting comfortably at home.  ?Head is normocephalic, atraumatic.  ?No labored breathing.  ?Speech is clear and coherent with logical content.  ?Patient is alert and oriented at baseline.  ?Redness with small elevated bumps to bilateral cheeks  ? ?Assessment and Plan: ?1. Rosacea ? ?- doxycycline (VIBRA-TABS) 100 MG tablet; Take 1 tablet (100 mg total) by mouth 2 (two) times daily for 14 days.  Dispense: 28 tablet; Refill: 0 ?   ?Follow Up Instructions: ?I discussed the assessment and treatment plan with the patient. The patient was provided an opportunity to ask questions and all were answered. The patient agreed with the plan and demonstrated an understanding of the instructions.  A copy of instructions were sent to the patient via MyChart unless otherwise noted below.  ? ? ?The patient was advised to call back or seek an in-person evaluation if the symptoms worsen or if the condition fails to improve as anticipated. ? ?Time:  ?I spent 15 minutes with the patient via telehealth  technology discussing the above problems/concerns.   ? ?Gail Schneiders, FNP  ?

## 2022-03-28 ENCOUNTER — Encounter: Payer: Self-pay | Admitting: Internal Medicine

## 2022-03-28 ENCOUNTER — Other Ambulatory Visit: Payer: Self-pay | Admitting: Nurse Practitioner

## 2022-03-28 DIAGNOSIS — L719 Rosacea, unspecified: Secondary | ICD-10-CM

## 2022-03-28 MED ORDER — METRONIDAZOLE 1 % EX GEL: Freq: Every day | CUTANEOUS | 0 refills | Status: AC

## 2022-03-28 NOTE — Progress Notes (Signed)
Additional Rx for rosacea  ? ? ?I have prescribed: ?A topical antibiotic called Metronidazole 1%.  Apply a thin film to affected area once daily.  Wash your hands before and after use. Make sure your skin is clean and dry.  Rub in gently and completely over the affected areas. ? ? ?

## 2022-04-24 LAB — HM DIABETES EYE EXAM

## 2022-04-24 NOTE — Progress Notes (Signed)
Virtual Visit via Video Note  I connected with Gail Payne on 04/24/22 at  7:50 AM EDT by a video enabled telemedicine application and verified that I am speaking with the correct person using two identifiers.   I discussed the limitations of evaluation and management by telemedicine and the availability of in person appointments. The patient expressed understanding and agreed to proceed.  Present for the visit:  Myself, Dr Billey Gosling, Julienne Kass.  The patient is currently at home and I am in the office.    No referring provider.    History of Present Illness: She is here for an acute visit for cold symptoms.   She has asthma.     Her symptoms started several days.  She is experiencing sinus pain, headaches nasal congestion, sore throat, cough, chest congestion and wheezing.  Her symptoms have really worsened in the past couple of days.  She is having difficulty sleeping.  She denies any fevers or shortness of breath.  Home COVID test was negative.  She has tried taking albuterol inhaler and neb treatments, mucinex, claritin D.      Review of Systems  Constitutional:  Negative for chills and fever.  HENT:  Positive for congestion, sinus pain and sore throat. Negative for ear pain.   Respiratory:  Positive for cough, sputum production and wheezing. Negative for shortness of breath.   Cardiovascular:  Negative for chest pain.  Gastrointestinal:  Negative for abdominal pain, diarrhea and nausea.  Neurological:  Positive for headaches. Negative for dizziness.     Social History   Socioeconomic History   Marital status: Divorced    Spouse name: Not on file   Number of children: 1   Years of education: Not on file   Highest education level: Not on file  Occupational History   Not on file  Tobacco Use   Smoking status: Never   Smokeless tobacco: Never  Vaping Use   Vaping Use: Never used  Substance and Sexual Activity   Alcohol use: No    Alcohol/week: 0.0  standard drinks   Drug use: No   Sexual activity: Yes    Birth control/protection: Surgical, Pill  Other Topics Concern   Not on file  Social History Narrative   Regular Exercise- no   Social Determinants of Health   Financial Resource Strain: Not on file  Food Insecurity: Not on file  Transportation Needs: Not on file  Physical Activity: Not on file  Stress: Not on file  Social Connections: Not on file     Observations/Objective: Appears well in NAD Sounds congested Intermittent cough Breathing normally   Assessment and Plan:  URI, mild asthma exacerbation: Acute Asthma flare secondary to URI Continue albuterol inhaler or neb treatments, Mucinex, Claritin-D At this point it does not sound like she needs steroids Start Augmentin 875-125 mg twice daily x10 days Hycodan cough syrup 5 mL every 8 hours as needed Can add in saline nasal spray, cough suppressant during the day Rest, fluids Call if no improvement   Follow Up Instructions:    I discussed the assessment and treatment plan with the patient. The patient was provided an opportunity to ask questions and all were answered. The patient agreed with the plan and demonstrated an understanding of the instructions.   The patient was advised to call back or seek an in-person evaluation if the symptoms worsen or if the condition fails to improve as anticipated.    Binnie Rail, MD

## 2022-04-25 ENCOUNTER — Telehealth: Payer: BC Managed Care – PPO | Admitting: Internal Medicine

## 2022-04-25 ENCOUNTER — Encounter: Payer: Self-pay | Admitting: Internal Medicine

## 2022-04-25 DIAGNOSIS — J45901 Unspecified asthma with (acute) exacerbation: Secondary | ICD-10-CM | POA: Diagnosis not present

## 2022-04-25 DIAGNOSIS — J069 Acute upper respiratory infection, unspecified: Secondary | ICD-10-CM

## 2022-04-25 MED ORDER — AMOXICILLIN-POT CLAVULANATE 875-125 MG PO TABS: 1.0000 | ORAL_TABLET | Freq: Two times a day (BID) | ORAL | 0 refills | Status: DC

## 2022-04-25 MED ORDER — HYDROCODONE BIT-HOMATROP MBR 5-1.5 MG/5ML PO SOLN: 5.0000 mL | Freq: Three times a day (TID) | ORAL | 0 refills | Status: DC | PRN

## 2022-05-05 ENCOUNTER — Other Ambulatory Visit: Payer: Self-pay | Admitting: Internal Medicine

## 2022-05-05 DIAGNOSIS — D510 Vitamin B12 deficiency anemia due to intrinsic factor deficiency: Secondary | ICD-10-CM

## 2022-05-24 DIAGNOSIS — E1165 Type 2 diabetes mellitus with hyperglycemia: Secondary | ICD-10-CM | POA: Diagnosis not present

## 2022-05-31 ENCOUNTER — Encounter: Payer: Self-pay | Admitting: Internal Medicine

## 2022-05-31 ENCOUNTER — Other Ambulatory Visit: Payer: Self-pay | Admitting: Internal Medicine

## 2022-05-31 DIAGNOSIS — D500 Iron deficiency anemia secondary to blood loss (chronic): Secondary | ICD-10-CM

## 2022-05-31 DIAGNOSIS — I10 Essential (primary) hypertension: Secondary | ICD-10-CM | POA: Diagnosis not present

## 2022-05-31 DIAGNOSIS — E7800 Pure hypercholesterolemia, unspecified: Secondary | ICD-10-CM | POA: Diagnosis not present

## 2022-05-31 DIAGNOSIS — E1165 Type 2 diabetes mellitus with hyperglycemia: Secondary | ICD-10-CM | POA: Diagnosis not present

## 2022-05-31 MED ORDER — FERROUS SULFATE 325 (65 FE) MG PO TABS: 325.0000 mg | ORAL_TABLET | Freq: Two times a day (BID) | ORAL | 0 refills | Status: DC

## 2022-06-03 ENCOUNTER — Other Ambulatory Visit: Payer: Self-pay | Admitting: Internal Medicine

## 2022-06-03 DIAGNOSIS — J301 Allergic rhinitis due to pollen: Secondary | ICD-10-CM

## 2022-06-03 MED ORDER — FLUTICASONE PROPIONATE 50 MCG/ACT NA SUSP: 2.0000 | Freq: Every day | NASAL | 1 refills | Status: DC

## 2022-06-19 DIAGNOSIS — G5602 Carpal tunnel syndrome, left upper limb: Secondary | ICD-10-CM | POA: Diagnosis not present

## 2022-06-25 ENCOUNTER — Other Ambulatory Visit: Payer: Self-pay | Admitting: Internal Medicine

## 2022-08-05 ENCOUNTER — Other Ambulatory Visit: Payer: Self-pay | Admitting: Internal Medicine

## 2022-08-05 DIAGNOSIS — E538 Deficiency of other specified B group vitamins: Secondary | ICD-10-CM

## 2022-08-05 DIAGNOSIS — D510 Vitamin B12 deficiency anemia due to intrinsic factor deficiency: Secondary | ICD-10-CM

## 2022-08-19 NOTE — Progress Notes (Unsigned)
    Subjective:    Patient ID: Gail Payne, female    DOB: 01/26/1971, 51 y.o.   MRN: [id number]      HPI Gail Payne is here for No chief complaint on file.    Bad fall    Medications and allergies reviewed with patient and updated if appropriate.  Current Outpatient Medications on File Prior to Visit  Medication Sig Dispense Refill   Albuterol Sulfate (PROAIR RESPICLICK) 212 (90 Base) MCG/ACT AEPB Inhale 1 puff into the lungs 4 (four) times daily as needed. 1 each 11   amoxicillin-clavulanate (AUGMENTIN) 875-125 MG tablet Take 1 tablet by mouth 2 (two) times daily. 20 tablet 0   aspirin 81 MG chewable tablet Chew by mouth daily.     B-D 3CC LUER-LOK SYR 25GX5/8" 25G X 5/8" 3 ML MISC USE TO INJECT B-12 MONTHLY 3 each 3   Cholecalciferol (VITAMIN D) 50 MCG (2000 UT) CAPS Take 1 capsule (2,000 Units total) by mouth daily. 30 capsule 5   cyanocobalamin (VITAMIN B12) 1000 MCG/ML injection INJECT 1 ML (1,000 MCG) INTO THE MUSCLE EVERY 30 DAYS 3 mL 3   Docusate Calcium (STOOL SOFTENER PO) Take by mouth daily. With Iron daily     ferrous sulfate (FEROSUL) 325 (65 FE) MG tablet Take 1 tablet (325 mg total) by mouth 2 (two) times daily with a meal. 180 tablet 0   fluticasone (FLONASE) 50 MCG/ACT nasal spray Place 2 sprays into both nostrils daily. 48 g 1   folic acid (FOLVITE) 1 MG tablet TAKE 1 TABLET DAILY 90 tablet 3   HYDROcodone bit-homatropine (HYCODAN) 5-1.5 MG/5ML syrup Take 5 mLs by mouth every 8 (eight) hours as needed for cough. 120 mL 0   MOUNJARO 5 MG/0.5ML Pen INJECT '5MG'$  SUBCUTANEOUS ONCE A WEEK 30 DAYS 6 mL 0   norethindrone (MICRONOR) 0.35 MG tablet Take 1 tablet (0.35 mg total) by mouth daily. 1 Package 11   omeprazole (PRILOSEC) 40 MG capsule Take 1 capsule (40 mg total) by mouth daily. 90 capsule 1   UBRELVY 100 MG TABS TAKE 1 TABLET BY MOUTH DAILY AS NEEDED 8 tablet 2   No current facility-administered medications on file prior to visit.    Review of Systems      Objective:  There were no vitals filed for this visit. BP Readings from Last 3 Encounters:  03/04/22 120/74  08/27/21 124/82  02/20/21 128/74   Wt Readings from Last 3 Encounters:  03/04/22 237 lb (107.5 kg)  08/27/21 259 lb (117.5 kg)  02/20/21 267 lb (121.1 kg)   There is no height or weight on file to calculate BMI.    Physical Exam         Assessment & Plan:    See Problem List for Assessment and Plan of chronic medical problems.

## 2022-08-20 ENCOUNTER — Encounter: Payer: Self-pay | Admitting: Internal Medicine

## 2022-08-20 ENCOUNTER — Ambulatory Visit: Payer: BC Managed Care – PPO | Admitting: Internal Medicine

## 2022-08-20 VITALS — BP 138/88 | HR 58 | Temp 97.9°F | Ht 65.0 in | Wt 235.0 lb

## 2022-08-20 DIAGNOSIS — R55 Syncope and collapse: Secondary | ICD-10-CM

## 2022-08-20 DIAGNOSIS — M791 Myalgia, unspecified site: Secondary | ICD-10-CM

## 2022-08-20 DIAGNOSIS — R519 Headache, unspecified: Secondary | ICD-10-CM | POA: Diagnosis not present

## 2022-08-20 DIAGNOSIS — S00511A Abrasion of lip, initial encounter: Secondary | ICD-10-CM | POA: Diagnosis not present

## 2022-08-20 MED ORDER — MELOXICAM 15 MG PO TABS: 15.0000 mg | ORAL_TABLET | Freq: Every day | ORAL | 0 refills | Status: DC

## 2022-08-20 MED ORDER — CYCLOBENZAPRINE HCL 5 MG PO TABS: 5.0000 mg | ORAL_TABLET | Freq: Every day | ORAL | 0 refills | Status: DC

## 2022-08-20 NOTE — Patient Instructions (Addendum)
     Your pain should slowly improve.     Medications changes include :  flexeril 5-10 mg at bedtime and meloxicam 15 mg daily - take with food.     Your prescription(s) have been sent to your pharmacy.      Return if symptoms worsen or fail to improve.

## 2022-08-31 ENCOUNTER — Other Ambulatory Visit: Payer: Self-pay | Admitting: Internal Medicine

## 2022-08-31 DIAGNOSIS — K219 Gastro-esophageal reflux disease without esophagitis: Secondary | ICD-10-CM

## 2022-09-13 ENCOUNTER — Other Ambulatory Visit: Payer: Self-pay | Admitting: Internal Medicine

## 2022-09-13 DIAGNOSIS — G43009 Migraine without aura, not intractable, without status migrainosus: Secondary | ICD-10-CM

## 2022-10-13 ENCOUNTER — Other Ambulatory Visit: Payer: Self-pay | Admitting: Internal Medicine

## 2022-10-13 DIAGNOSIS — J301 Allergic rhinitis due to pollen: Secondary | ICD-10-CM

## 2022-10-13 NOTE — Progress Notes (Deleted)
    Subjective:    Patient ID: Gail Payne, female    DOB: [birth year], 51 y.o.   MRN: [id number]      HPI Gail Payne is here for No chief complaint on file.    Itching, rosacea on face -     Medications and allergies reviewed with patient and updated if appropriate.  Current Outpatient Medications on File Prior to Visit  Medication Sig Dispense Refill   Albuterol Sulfate (PROAIR RESPICLICK) 409 (90 Base) MCG/ACT AEPB Inhale 1 puff into the lungs 4 (four) times daily as needed. 1 each 11   aspirin 81 MG chewable tablet Chew by mouth daily.     B-D 3CC LUER-LOK SYR 25GX5/8" 25G X 5/8" 3 ML MISC USE TO INJECT B-12 MONTHLY 3 each 3   Cholecalciferol (VITAMIN D) 50 MCG (2000 UT) CAPS Take 1 capsule (2,000 Units total) by mouth daily. 30 capsule 5   cyanocobalamin (VITAMIN B12) 1000 MCG/ML injection INJECT 1 ML (1,000 MCG) INTO THE MUSCLE EVERY 30 DAYS 3 mL 3   cyclobenzaprine (FLEXERIL) 5 MG tablet Take 1-2 tablets (5-10 mg total) by mouth at bedtime. 20 tablet 0   Docusate Calcium (STOOL SOFTENER PO) Take by mouth daily. With Iron daily     ferrous sulfate (FEROSUL) 325 (65 FE) MG tablet Take 1 tablet (325 mg total) by mouth 2 (two) times daily with a meal. 180 tablet 0   fluticasone (FLONASE) 50 MCG/ACT nasal spray Place 2 sprays into both nostrils daily. 48 g 1   folic acid (FOLVITE) 1 MG tablet TAKE 1 TABLET DAILY 90 tablet 3   losartan (COZAAR) 50 MG tablet Take 50 mg by mouth daily.     meloxicam (MOBIC) 15 MG tablet Take 1 tablet (15 mg total) by mouth daily. 14 tablet 0   MOUNJARO 5 MG/0.5ML Pen INJECT '5MG'$  SUBCUTANEOUS ONCE A WEEK 30 DAYS 6 mL 0   norethindrone (MICRONOR) 0.35 MG tablet Take 1 tablet (0.35 mg total) by mouth daily. 1 Package 11   omeprazole (PRILOSEC) 40 MG capsule TAKE 1 CAPSULE (40 MG TOTAL) BY MOUTH DAILY. 90 capsule 1   UBRELVY 100 MG TABS TAKE 1 TABLET BY MOUTH DAILY AS NEEDED 8 tablet 2   No current facility-administered medications on file prior to  visit.    Review of Systems     Objective:  There were no vitals filed for this visit. BP Readings from Last 3 Encounters:  08/20/22 138/88  03/04/22 120/74  08/27/21 124/82   Wt Readings from Last 3 Encounters:  08/20/22 235 lb (106.6 kg)  03/04/22 237 lb (107.5 kg)  08/27/21 259 lb (117.5 kg)   There is no height or weight on file to calculate BMI.    Physical Exam         Assessment & Plan:    See Problem List for Assessment and Plan of chronic medical problems.

## 2022-10-15 ENCOUNTER — Ambulatory Visit: Payer: BC Managed Care – PPO | Admitting: Internal Medicine

## 2022-10-15 DIAGNOSIS — D3610 Benign neoplasm of peripheral nerves and autonomic nervous system, unspecified: Secondary | ICD-10-CM | POA: Diagnosis not present

## 2022-10-15 DIAGNOSIS — L719 Rosacea, unspecified: Secondary | ICD-10-CM | POA: Diagnosis not present

## 2022-10-18 ENCOUNTER — Telehealth: Payer: Self-pay

## 2022-10-18 NOTE — Telephone Encounter (Signed)
Key: [id number]  Approved 09/18/2022 - 10/18/2023

## 2022-11-05 ENCOUNTER — Other Ambulatory Visit: Payer: Self-pay | Admitting: Internal Medicine

## 2022-11-22 DIAGNOSIS — E7800 Pure hypercholesterolemia, unspecified: Secondary | ICD-10-CM | POA: Diagnosis not present

## 2022-11-22 DIAGNOSIS — E1165 Type 2 diabetes mellitus with hyperglycemia: Secondary | ICD-10-CM | POA: Diagnosis not present

## 2022-11-27 LAB — HEMOGLOBIN A1C: Hemoglobin A1C: 5.1

## 2022-11-29 DIAGNOSIS — E7800 Pure hypercholesterolemia, unspecified: Secondary | ICD-10-CM | POA: Diagnosis not present

## 2022-11-29 DIAGNOSIS — E1165 Type 2 diabetes mellitus with hyperglycemia: Secondary | ICD-10-CM | POA: Diagnosis not present

## 2022-11-29 DIAGNOSIS — I10 Essential (primary) hypertension: Secondary | ICD-10-CM | POA: Diagnosis not present

## 2022-12-16 ENCOUNTER — Other Ambulatory Visit: Payer: Self-pay

## 2022-12-16 DIAGNOSIS — G43009 Migraine without aura, not intractable, without status migrainosus: Secondary | ICD-10-CM

## 2022-12-16 MED ORDER — UBRELVY 100 MG PO TABS: 1.0000 | ORAL_TABLET | Freq: Every day | ORAL | 2 refills | Status: DC | PRN

## 2022-12-20 ENCOUNTER — Telehealth: Payer: BC Managed Care – PPO | Admitting: Physician Assistant

## 2022-12-20 ENCOUNTER — Telehealth: Payer: BC Managed Care – PPO | Admitting: Nurse Practitioner

## 2022-12-20 DIAGNOSIS — U071 COVID-19: Secondary | ICD-10-CM

## 2022-12-20 MED ORDER — NIRMATRELVIR/RITONAVIR (PAXLOVID)TABLET: 3.0000 | ORAL_TABLET | Freq: Two times a day (BID) | ORAL | 0 refills | Status: AC

## 2022-12-20 MED ORDER — ALBUTEROL SULFATE HFA 108 (90 BASE) MCG/ACT IN AERS: 2.0000 | INHALATION_SPRAY | Freq: Four times a day (QID) | RESPIRATORY_TRACT | 0 refills | Status: AC | PRN

## 2022-12-20 NOTE — Progress Notes (Signed)
Katharine Look,  Sorry for the delay. In order to be evaluated for anti-viral medication you need to schedule a video visit.     Thank you for the details you included in the comment boxes. Those details are very helpful in determining the best course of treatment for you and help [place] to provide the best care.Because we do not prescribe the anti-viral medications (Paxlovid) over e-visits, we recommend that you convert this visit to a video visit in order for the provider to better assess what is going on.  The provider will be able to give you a more accurate diagnosis and treatment plan if we can more freely discuss your symptoms and with the addition of a virtual examination.   If you convert to a video visit, we will bill your insurance (similar to an office visit) and you will not be charged for this e-Visit. You will be able to stay at home and speak with the first available [facility] advanced practice provider. The link to do a video visit is in the drop down Menu tab of your Welcome screen in [place].

## 2022-12-20 NOTE — Patient Instructions (Signed)
I did call in an Albuterol refill as well in case you need it

## 2022-12-20 NOTE — Progress Notes (Signed)
Virtual Visit Consent   Gail Payne, you are scheduled for a virtual visit with a [facility] provider today. Just as with appointments in the office, your consent must be obtained to participate. Your consent will be active for this visit and any virtual visit you may have with one of our providers in the next 365 days. If you have a MyChart account, a copy of this consent can be sent to you electronically.  As this is a virtual visit, video technology does not allow for your provider to perform a traditional examination. This may limit your provider's ability to fully assess your condition. If your provider identifies any concerns that need to be evaluated in person or the need to arrange testing (such as labs, EKG, etc.), we will make arrangements to do so. Although advances in technology are sophisticated, we cannot ensure that it will always work on either your end or our end. If the connection with a video visit is poor, the visit may have to be switched to a telephone visit. With either a video or telephone visit, we are not always able to ensure that we have a secure connection.  By engaging in this virtual visit, you consent to the provision of healthcare and authorize for your insurance to be billed (if applicable) for the services provided during this visit. Depending on your insurance coverage, you may receive a charge related to this service.  I need to obtain your verbal consent now. Are you willing to proceed with your visit today? Gail Payne has provided verbal consent on 12/20/2022 for a virtual visit (video or telephone). [name], FNP  Date: 12/20/2022 10:15 AM  Virtual Visit via Video Note   I, [name], connected with  Gail Payne  ([id number], [birth year]) on 12/20/22 at 10:15 AM EST by a video-enabled telemedicine application and verified that I am speaking with the correct person using two identifiers.  Location: Patient: Virtual Visit Location Patient:  Home Provider: Virtual Visit Location Provider: Home Office   I discussed the limitations of evaluation and management by telemedicine and the availability of in person appointments. The patient expressed understanding and agreed to proceed.    History of Present Illness: Gail Payne is a 52 y.o. who identifies as a female who was assigned female at birth, and is being seen today after testing positive for COVID.  Symptom onset was 1-2 days ago- yesterday were the onset of worst symptoms: Sore throat, headache, chills, body aches, nasal congestion   She has had COVID once in the past 2-3 years ago   She has been vaccinated x4 for COVID   She has a history of asthma and has used Albuterol in the past but has not needed it recently   Treated for HTN and DM2   Problems:  Patient Active Problem List   Diagnosis Date Noted   Seasonal allergic rhinitis due to pollen 06/03/2022   Diuretic-induced hypokalemia 08/28/2021   Type II diabetes mellitus with manifestations ([place]) 08/28/2021   Primary hypertension 10/23/2020   Bursitis of right shoulder 82/50/5397   Systolic murmur 67/34/1937   Right lumbar radiculitis 12/17/2017   Hidradenitis suppurativa of right axilla 03/17/2017   OAB (overactive bladder) 06/29/2016   Upper airway cough syndrome 02/24/2015   Morbid obesity with BMI of 40.0-44.9, adult ([place]) 08/27/2013   Back pain, thoracic 08/14/2012   Routine general medical examination at a health care facility 01/31/2012   Carpal tunnel syndrome of right wrist 07/26/2011  B12 deficiency anemia 07/10/2011   DJD (degenerative joint disease) of knee 07/10/2011   Folate deficiency 01/04/2011   Iron deficiency anemia 09/08/2008   GERD 09/08/2008   Moderate persistent asthma 10/16/2007    Allergies:  Allergies  Allergen Reactions   Latex    Metformin Hcl Er Other (See Comments)   Medications:  Current Outpatient Medications:    Albuterol Sulfate (PROAIR RESPICLICK) 540 (90  Base) MCG/ACT AEPB, Inhale 1 puff into the lungs 4 (four) times daily as needed., Disp: 1 each, Rfl: 11   aspirin 81 MG chewable tablet, Chew by mouth daily., Disp: , Rfl:    B-D 3CC LUER-LOK SYR 25GX5/8" 25G X 5/8" 3 ML MISC, USE TO INJECT B-12 MONTHLY, Disp: 3 each, Rfl: 3   Cholecalciferol (VITAMIN D) 50 MCG (2000 UT) CAPS, Take 1 capsule (2,000 Units total) by mouth daily., Disp: 30 capsule, Rfl: 5   cyanocobalamin (VITAMIN B12) 1000 MCG/ML injection, INJECT 1 ML (1,000 MCG) INTO THE MUSCLE EVERY 30 DAYS, Disp: 3 mL, Rfl: 3   cyclobenzaprine (FLEXERIL) 5 MG tablet, Take 1-2 tablets (5-10 mg total) by mouth at bedtime., Disp: 20 tablet, Rfl: 0   Docusate Calcium (STOOL SOFTENER PO), Take by mouth daily. With Iron daily, Disp: , Rfl:    ferrous sulfate (FEROSUL) 325 (65 FE) MG tablet, Take 1 tablet (325 mg total) by mouth 2 (two) times daily with a meal., Disp: 180 tablet, Rfl: 0   fluticasone (FLONASE) 50 MCG/ACT nasal spray, SPRAY 2 SPRAYS INTO EACH NOSTRIL EVERY DAY, Disp: 48 mL, Rfl: 1   folic acid (FOLVITE) 1 MG tablet, TAKE 1 TABLET DAILY, Disp: 90 tablet, Rfl: 3   losartan (COZAAR) 50 MG tablet, Take 50 mg by mouth daily., Disp: , Rfl:    meloxicam (MOBIC) 15 MG tablet, Take 1 tablet (15 mg total) by mouth daily., Disp: 14 tablet, Rfl: 0   MOUNJARO 5 MG/0.5ML Pen, INJECT '5MG'$  SUBCUTANEOUS ONCE A WEEK 30 DAYS, Disp: 6 mL, Rfl: 0   norethindrone (MICRONOR) 0.35 MG tablet, Take 1 tablet (0.35 mg total) by mouth daily., Disp: 1 Package, Rfl: 11   omeprazole (PRILOSEC) 40 MG capsule, TAKE 1 CAPSULE (40 MG TOTAL) BY MOUTH DAILY., Disp: 90 capsule, Rfl: 1   Ubrogepant (UBRELVY) 100 MG TABS, Take 1 tablet by mouth daily as needed., Disp: 8 tablet, Rfl: 2  Observations/Objective: Patient is well-developed, well-nourished in no acute distress.  Resting comfortably  at home.  Head is normocephalic, atraumatic.  No labored breathing.  Speech is clear and coherent with logical content.  Patient is  alert and oriented at baseline.    Assessment and Plan: 1. COVID-19 Discussed managing symptoms with over the counter Coricidin or Mucinex, tylenol and ibuprofen  - nirmatrelvir/ritonavir (PAXLOVID) 20 x 150 MG & 10 x '100MG'$  TABS; Take 3 tablets by mouth 2 (two) times daily for 5 days. (Take nirmatrelvir 150 mg two tablets twice daily for 5 days and ritonavir 100 mg one tablet twice daily for 5 days) Patient GFR is 83  Dispense: 30 tablet; Refill: 0 - albuterol (VENTOLIN HFA) 108 (90 Base) MCG/ACT inhaler; Inhale 2 puffs into the lungs every 6 (six) hours as needed for wheezing or shortness of breath.  Dispense: 8 g; Refill: 0     Follow Up Instructions: I discussed the assessment and treatment plan with the patient. The patient was provided an opportunity to ask questions and all were answered. The patient agreed with the plan and demonstrated an understanding of  the instructions.  A copy of instructions were sent to the patient via MyChart unless otherwise noted below.    The patient was advised to call back or seek an in-person evaluation if the symptoms worsen or if the condition fails to improve as anticipated.  Time:  I spent 10 minutes with the patient via telehealth technology discussing the above problems/concerns.    [name], FNP

## 2022-12-23 ENCOUNTER — Telehealth: Payer: Self-pay | Admitting: Internal Medicine

## 2022-12-23 NOTE — Telephone Encounter (Signed)
Patient called and said she did a e visit Friday 12/20/22 and she said she needs a note for work and wasn't sure about to go about getting that. Is that something we can do?

## 2022-12-23 NOTE — Telephone Encounter (Signed)
This was done by physician who seen her for e-visit per Epic

## 2023-01-11 ENCOUNTER — Other Ambulatory Visit: Payer: Self-pay | Admitting: Nurse Practitioner

## 2023-01-11 DIAGNOSIS — U071 COVID-19: Secondary | ICD-10-CM

## 2023-01-15 ENCOUNTER — Other Ambulatory Visit: Payer: Self-pay | Admitting: Internal Medicine

## 2023-01-15 DIAGNOSIS — K219 Gastro-esophageal reflux disease without esophagitis: Secondary | ICD-10-CM

## 2023-01-25 DIAGNOSIS — M542 Cervicalgia: Secondary | ICD-10-CM | POA: Diagnosis not present

## 2023-01-25 DIAGNOSIS — M25512 Pain in left shoulder: Secondary | ICD-10-CM | POA: Diagnosis not present

## 2023-02-04 ENCOUNTER — Other Ambulatory Visit: Payer: Self-pay | Admitting: Internal Medicine

## 2023-02-04 DIAGNOSIS — D500 Iron deficiency anemia secondary to blood loss (chronic): Secondary | ICD-10-CM

## 2023-02-07 DIAGNOSIS — Z124 Encounter for screening for malignant neoplasm of cervix: Secondary | ICD-10-CM | POA: Diagnosis not present

## 2023-02-07 DIAGNOSIS — Z6838 Body mass index (BMI) 38.0-38.9, adult: Secondary | ICD-10-CM | POA: Diagnosis not present

## 2023-02-07 DIAGNOSIS — Z01419 Encounter for gynecological examination (general) (routine) without abnormal findings: Secondary | ICD-10-CM | POA: Diagnosis not present

## 2023-02-07 DIAGNOSIS — Z1231 Encounter for screening mammogram for malignant neoplasm of breast: Secondary | ICD-10-CM | POA: Diagnosis not present

## 2023-02-07 DIAGNOSIS — N760 Acute vaginitis: Secondary | ICD-10-CM | POA: Diagnosis not present

## 2023-02-07 LAB — HM MAMMOGRAPHY: HM Mammogram: NORMAL (ref 0–4)

## 2023-02-07 LAB — HM PAP SMEAR

## 2023-02-10 ENCOUNTER — Ambulatory Visit (INDEPENDENT_AMBULATORY_CARE_PROVIDER_SITE_OTHER): Payer: BC Managed Care – PPO | Admitting: Internal Medicine

## 2023-02-10 ENCOUNTER — Encounter: Payer: Self-pay | Admitting: Internal Medicine

## 2023-02-10 VITALS — BP 138/78 | HR 64 | Temp 98.0°F | Resp 16 | Ht 65.0 in | Wt 241.0 lb

## 2023-02-10 DIAGNOSIS — I10 Essential (primary) hypertension: Secondary | ICD-10-CM

## 2023-02-10 DIAGNOSIS — E538 Deficiency of other specified B group vitamins: Secondary | ICD-10-CM

## 2023-02-10 DIAGNOSIS — D500 Iron deficiency anemia secondary to blood loss (chronic): Secondary | ICD-10-CM | POA: Diagnosis not present

## 2023-02-10 DIAGNOSIS — E118 Type 2 diabetes mellitus with unspecified complications: Secondary | ICD-10-CM

## 2023-02-10 DIAGNOSIS — E785 Hyperlipidemia, unspecified: Secondary | ICD-10-CM

## 2023-02-10 DIAGNOSIS — D510 Vitamin B12 deficiency anemia due to intrinsic factor deficiency: Secondary | ICD-10-CM

## 2023-02-10 LAB — BASIC METABOLIC PANEL
BUN: 18 mg/dL (ref 6–23)
CO2: 30 mEq/L (ref 19–32)
Calcium: 9.6 mg/dL (ref 8.4–10.5)
Chloride: 105 mEq/L (ref 96–112)
Creatinine, Ser: 0.73 mg/dL (ref 0.40–1.20)
GFR: 94.83 mL/min (ref >60.00)
Glucose, Bld: 84 mg/dL (ref 70–99)
Potassium: 4.2 mEq/L (ref 3.5–5.1)
Sodium: 142 mEq/L (ref 135–145)

## 2023-02-10 LAB — CBC WITH DIFFERENTIAL/PLATELET
Basophils Absolute: 0 10*3/uL (ref 0.0–0.1)
Basophils Relative: 0.5 % (ref 0.0–3.0)
Eosinophils Absolute: 0 10*3/uL (ref 0.0–0.7)
Eosinophils Relative: 0 % (ref 0.0–5.0)
HCT: 39.4 % (ref 36.0–46.0)
Hemoglobin: 13.1 g/dL (ref 12.0–15.0)
Lymphocytes Relative: 34.6 % (ref 12.0–46.0)
Lymphs Abs: 2.7 10*3/uL (ref 0.7–4.0)
MCHC: 33.3 g/dL (ref 30.0–36.0)
MCV: 87.3 fl (ref 78.0–100.0)
Monocytes Absolute: 0.5 10*3/uL (ref 0.1–1.0)
Monocytes Relative: 6.8 % (ref 3.0–12.0)
Neutro Abs: 4.6 10*3/uL (ref 1.4–7.7)
Neutrophils Relative %: 58.1 % (ref 43.0–77.0)
Platelets: 385 10*3/uL (ref 150.0–400.0)
RBC: 4.51 Mil/uL (ref 3.87–5.11)
RDW: 15 % (ref 11.5–15.5)
WBC: 7.8 10*3/uL (ref 4.0–10.5)

## 2023-02-10 LAB — URINALYSIS, ROUTINE W REFLEX MICROSCOPIC
Hgb urine dipstick: NEGATIVE
Leukocytes,Ua: NEGATIVE
Nitrite: NEGATIVE
Specific Gravity, Urine: 1.025 (ref 1.000–1.030)
Total Protein, Urine: NEGATIVE
Urine Glucose: NEGATIVE
Urobilinogen, UA: 1 (ref 0.0–1.0)
pH: 6 (ref 5.0–8.0)

## 2023-02-10 LAB — HEPATIC FUNCTION PANEL
ALT: 17 U/L (ref 0–35)
AST: 18 U/L (ref 0–37)
Albumin: 3.8 g/dL (ref 3.5–5.2)
Alkaline Phosphatase: 78 U/L (ref 39–117)
Bilirubin, Direct: 0.1 mg/dL (ref 0.0–0.3)
Total Bilirubin: 0.5 mg/dL (ref 0.2–1.2)
Total Protein: 7.4 g/dL (ref 6.0–8.3)

## 2023-02-10 LAB — MICROALBUMIN / CREATININE URINE RATIO
Creatinine,U: 199.1 mg/dL
Microalb Creat Ratio: 1 mg/g (ref 0.0–30.0)
Microalb, Ur: 2 mg/dL — ABNORMAL HIGH (ref 0.0–1.9)

## 2023-02-10 LAB — FOLATE: Folate: 22.7 ng/mL (ref >5.9)

## 2023-02-10 LAB — TSH: TSH: 0.36 u[IU]/mL (ref 0.35–5.50)

## 2023-02-10 LAB — VITAMIN B12: Vitamin B-12: 817 pg/mL (ref 211–911)

## 2023-02-10 MED ORDER — ROSUVASTATIN CALCIUM 10 MG PO TABS: 10.0000 mg | ORAL_TABLET | Freq: Every day | ORAL | 1 refills | Status: DC

## 2023-02-10 NOTE — Patient Instructions (Signed)

## 2023-02-10 NOTE — Progress Notes (Unsigned)
Subjective:  Patient ID: Gail Payne, female    DOB: 25-Oct-1971  Age: 52 y.o. MRN: [id number]  CC: Annual Exam, Hyperlipidemia, and Hypertension   HPI BETY CANCEL presents for a CPX and f/up ----  She is active and denies chest pain, shortness of breath, diaphoresis, or edema.  Outpatient Medications Prior to Visit  Medication Sig Dispense Refill   albuterol (VENTOLIN HFA) 108 (90 Base) MCG/ACT inhaler Inhale 2 puffs into the lungs every 6 (six) hours as needed for wheezing or shortness of breath. 8 g 0   aspirin 81 MG chewable tablet Chew by mouth daily.     B-D 3CC LUER-LOK SYR 25GX5/8" 25G X 5/8" 3 ML MISC USE TO INJECT B-12 MONTHLY 3 each 3   Cholecalciferol (VITAMIN D) 50 MCG (2000 UT) CAPS Take 1 capsule (2,000 Units total) by mouth daily. 30 capsule 5   cyanocobalamin (VITAMIN B12) 1000 MCG/ML injection INJECT 1 ML (1,000 MCG) INTO THE MUSCLE EVERY 30 DAYS 3 mL 3   Docusate Calcium (STOOL SOFTENER PO) Take by mouth daily. With Iron daily     ferrous sulfate (FEROSUL) 325 (65 FE) MG tablet TAKE 1 TABLET TWICE A DAY WITH MEALS 180 tablet 0   fluticasone (FLONASE) 50 MCG/ACT nasal spray SPRAY 2 SPRAYS INTO EACH NOSTRIL EVERY DAY 48 mL 1   folic acid (FOLVITE) 1 MG tablet TAKE 1 TABLET DAILY 90 tablet 3   losartan (COZAAR) 50 MG tablet Take 50 mg by mouth daily.     MOUNJARO 5 MG/0.5ML Pen INJECT '5MG'$  SUBCUTANEOUS ONCE A WEEK 30 DAYS 6 mL 0   omeprazole (PRILOSEC) 40 MG capsule TAKE 1 CAPSULE (40 MG TOTAL) BY MOUTH DAILY. 90 capsule 0   Ubrogepant (UBRELVY) 100 MG TABS Take 1 tablet by mouth daily as needed. 8 tablet 2   No facility-administered medications prior to visit.    ROS Review of Systems  Constitutional: Negative.  Negative for diaphoresis, fatigue and unexpected weight change.  HENT: Negative.    Eyes: Negative.   Respiratory:  Negative for cough, chest tightness, shortness of breath and wheezing.   Cardiovascular:  Negative for chest pain, palpitations and  leg swelling.  Gastrointestinal:  Negative for abdominal pain, constipation, diarrhea, nausea and vomiting.  Endocrine: Negative.   Genitourinary: Negative.  Negative for difficulty urinating.  Musculoskeletal: Negative.  Negative for arthralgias.  Skin: Negative.   Neurological: Negative.  Negative for dizziness, weakness and headaches.  Hematological:  Negative for adenopathy. Does not bruise/bleed easily.  Psychiatric/Behavioral: Negative.      Objective:  BP 138/78 (BP Location: Left Arm, Patient Position: Sitting, Cuff Size: Large)   Pulse 64   Temp 98 F (36.7 C) (Oral)   Resp 16   Ht '5\' 5"'$  (1.651 m)   Wt 241 lb (109.3 kg)   SpO2 99%   BMI 40.10 kg/m   BP Readings from Last 3 Encounters:  02/10/23 138/78  08/20/22 138/88  03/04/22 120/74    Wt Readings from Last 3 Encounters:  02/10/23 241 lb (109.3 kg)  08/20/22 235 lb (106.6 kg)  03/04/22 237 lb (107.5 kg)    Physical Exam Vitals reviewed.  HENT:     Nose: Nose normal.     Mouth/Throat:     Mouth: Mucous membranes are moist.  Eyes:     General: No scleral icterus.    Conjunctiva/sclera: Conjunctivae normal.  Cardiovascular:     Rate and Rhythm: Normal rate and regular rhythm.  Heart sounds: Murmur heard.     Systolic murmur is present with a grade of 1/6.     No friction rub. No gallop.  Pulmonary:     Effort: Pulmonary effort is normal.     Breath sounds: No stridor. No wheezing, rhonchi or rales.  Abdominal:     General: Abdomen is flat.     Palpations: There is no mass.     Tenderness: There is no abdominal tenderness. There is no guarding.     Hernia: No hernia is present.  Musculoskeletal:        General: Normal range of motion.     Cervical back: Neck supple.     Right lower leg: No edema.     Left lower leg: No edema.  Lymphadenopathy:     Cervical: No cervical adenopathy.  Skin:    General: Skin is warm and dry.  Neurological:     General: No focal deficit present.     Mental  Status: She is alert. Mental status is at baseline.  Psychiatric:        Mood and Affect: Mood normal.        Behavior: Behavior normal.     Lab Results  Component Value Date   WBC 7.8 02/10/2023   HGB 13.1 02/10/2023   HCT 39.4 02/10/2023   PLT 385.0 02/10/2023   GLUCOSE 84 02/10/2023   CHOL 153 03/04/2022   TRIG 66.0 03/04/2022   HDL 41.10 03/04/2022   LDLCALC 99 03/04/2022   ALT 17 02/10/2023   AST 18 02/10/2023   NA 142 02/10/2023   K 4.2 02/10/2023   CL 105 02/10/2023   CREATININE 0.73 02/10/2023   BUN 18 02/10/2023   CO2 30 02/10/2023   TSH 0.36 02/10/2023   HGBA1C 5.1 11/27/2022   MICROALBUR 2.0 (H) 02/10/2023    DG Cervical Spine 2 or 3 views  Result Date: 12/28/2019 CLINICAL DATA:  RIGHT side neck pain with RIGHT arm and hand numbness for 6 months, no known injury EXAM: CERVICAL SPINE - 2-3 VIEW COMPARISON:  05/12/2019 FINDINGS: Straightening of cervical lordosis question muscle spasm. Prevertebral soft tissues normal thickness. Osseous mineralization normal. Vertebral body and disc space heights maintained. No fracture, subluxation, or bone destruction. Lung apices clear. IMPRESSION: Question muscle spasm; otherwise negative exam. If patient is having radicular symptoms to the upper extremity, consider cervical spine MR assessment. Electronically Signed   By: Lavonia Dana M.D.   On: 12/28/2019 12:01   DG Lumbar Spine 2-3 Views  Result Date: 12/28/2019 CLINICAL DATA:  RIGHT-side low back pain radiating to leg and foot for 1 year, no known injury EXAM: LUMBAR SPINE - 2-3 VIEW COMPARISON:  None Correlation: MR lumbar spine 12/28/2017 FINDINGS: Five non-rib-bearing lumbar vertebra. Osseous mineralization normal. Mild degenerative disc disease changes at T11-T12 with minimal endplate sclerosis, spurring and question superior endplate height loss of T12 vertebral body new since 12/28/2017. Vertebral body heights otherwise maintained. No additional fracture, subluxation, or  bone destruction. No obvious spondylolysis. SI joints grossly preserved. Surgical clips project anterior to the RIGHT SI joint. IMPRESSION: Degenerative disc disease changes at T12-L1 with mild age-indeterminate superior endplate compression deformity of T12 vertebral body new since 12/28/2017. Electronically Signed   By: Lavonia Dana M.D.   On: 12/28/2019 11:59    Assessment & Plan:   Lenda was seen today for annual exam, hyperlipidemia and hypertension.  Diagnoses and all orders for this visit:  Primary hypertension- Her blood pressure is well-controlled. -  Basic metabolic panel; Future -     CBC with Differential/Platelet; Future -     TSH; Future -     Hepatic function panel; Future -     Urinalysis, Routine w reflex microscopic; Future -     Urinalysis, Routine w reflex microscopic -     Hepatic function panel -     TSH -     CBC with Differential/Platelet -     Basic metabolic panel  Vitamin 123456 deficiency anemia due to intrinsic factor deficiency- B12 and folate are normal. -     Vitamin B12; Future -     Folate; Future -     Folate -     Vitamin B12  Iron deficiency anemia due to chronic blood loss- H&H are normal now. -     CBC with Differential/Platelet; Future -     CBC with Differential/Platelet  Dyslipidemia, goal LDL below 100 - LDL goal achieved. Doing well on the statin  -     rosuvastatin (CRESTOR) 10 MG tablet; Take 1 tablet (10 mg total) by mouth daily. -     TSH; Future -     Hepatic function panel; Future -     Hepatic function panel -     TSH  Type II diabetes mellitus with manifestations (HCC) -     HM Diabetes Foot Exam -     Basic metabolic panel; Future -     Microalbumin / creatinine urine ratio; Future -     Microalbumin / creatinine urine ratio -     Basic metabolic panel  Folate deficiency -     Basic metabolic panel; Future -     Folate; Future -     Folate -     Basic metabolic panel   I am having Gail Payne start on  rosuvastatin. I am also having her maintain her Vitamin D, aspirin, Docusate Calcium (STOOL SOFTENER PO), B-D 3CC LUER-LOK SYR 25GX5/8", Mounjaro, cyanocobalamin, folic acid, losartan, fluticasone, Ubrelvy, albuterol, omeprazole, and FeroSul.  Meds ordered this encounter  Medications   rosuvastatin (CRESTOR) 10 MG tablet    Sig: Take 1 tablet (10 mg total) by mouth daily.    Dispense:  90 tablet    Refill:  1     Follow-up: Return in about 6 months (around 08/13/2023).  Scarlette Calico, MD

## 2023-02-11 ENCOUNTER — Encounter: Payer: Self-pay | Admitting: Internal Medicine

## 2023-02-21 DIAGNOSIS — R61 Generalized hyperhidrosis: Secondary | ICD-10-CM | POA: Diagnosis not present

## 2023-02-21 DIAGNOSIS — N950 Postmenopausal bleeding: Secondary | ICD-10-CM | POA: Diagnosis not present

## 2023-02-21 DIAGNOSIS — N3941 Urge incontinence: Secondary | ICD-10-CM | POA: Diagnosis not present

## 2023-03-05 DIAGNOSIS — R9389 Abnormal findings on diagnostic imaging of other specified body structures: Secondary | ICD-10-CM | POA: Diagnosis not present

## 2023-03-05 DIAGNOSIS — N950 Postmenopausal bleeding: Secondary | ICD-10-CM | POA: Diagnosis not present

## 2023-03-20 LAB — HM DIABETES EYE EXAM

## 2023-04-04 DIAGNOSIS — E7800 Pure hypercholesterolemia, unspecified: Secondary | ICD-10-CM | POA: Diagnosis not present

## 2023-04-04 DIAGNOSIS — E1165 Type 2 diabetes mellitus with hyperglycemia: Secondary | ICD-10-CM | POA: Diagnosis not present

## 2023-04-05 DIAGNOSIS — E119 Type 2 diabetes mellitus without complications: Secondary | ICD-10-CM | POA: Diagnosis not present

## 2023-04-10 DIAGNOSIS — I10 Essential (primary) hypertension: Secondary | ICD-10-CM | POA: Diagnosis not present

## 2023-04-10 DIAGNOSIS — E7800 Pure hypercholesterolemia, unspecified: Secondary | ICD-10-CM | POA: Diagnosis not present

## 2023-04-10 DIAGNOSIS — E1165 Type 2 diabetes mellitus with hyperglycemia: Secondary | ICD-10-CM | POA: Diagnosis not present

## 2023-04-14 ENCOUNTER — Other Ambulatory Visit: Payer: Self-pay | Admitting: Internal Medicine

## 2023-04-14 DIAGNOSIS — K219 Gastro-esophageal reflux disease without esophagitis: Secondary | ICD-10-CM

## 2023-05-02 ENCOUNTER — Other Ambulatory Visit: Payer: Self-pay | Admitting: Internal Medicine

## 2023-05-02 DIAGNOSIS — D500 Iron deficiency anemia secondary to blood loss (chronic): Secondary | ICD-10-CM

## 2023-05-05 ENCOUNTER — Other Ambulatory Visit: Payer: Self-pay

## 2023-05-05 ENCOUNTER — Emergency Department (HOSPITAL_BASED_OUTPATIENT_CLINIC_OR_DEPARTMENT_OTHER)
Admission: EM | Admit: 2023-05-05 | Discharge: 2023-05-05 | Disposition: A | Discharge status: Home / Self Care | Payer: BC Managed Care – PPO | Attending: Emergency Medicine | Admitting: Emergency Medicine

## 2023-05-05 ENCOUNTER — Encounter (HOSPITAL_BASED_OUTPATIENT_CLINIC_OR_DEPARTMENT_OTHER): Payer: Self-pay

## 2023-05-05 ENCOUNTER — Emergency Department (HOSPITAL_BASED_OUTPATIENT_CLINIC_OR_DEPARTMENT_OTHER): Payer: BC Managed Care – PPO | Admitting: Radiology

## 2023-05-05 DIAGNOSIS — X509XXA Other and unspecified overexertion or strenuous movements or postures, initial encounter: Secondary | ICD-10-CM | POA: Diagnosis not present

## 2023-05-05 DIAGNOSIS — Z79899 Other long term (current) drug therapy: Secondary | ICD-10-CM | POA: Insufficient documentation

## 2023-05-05 DIAGNOSIS — Y92000 Kitchen of unspecified non-institutional (private) residence as  the place of occurrence of the external cause: Secondary | ICD-10-CM | POA: Insufficient documentation

## 2023-05-05 DIAGNOSIS — I10 Essential (primary) hypertension: Secondary | ICD-10-CM | POA: Insufficient documentation

## 2023-05-05 DIAGNOSIS — S83241A Other tear of medial meniscus, current injury, right knee, initial encounter: Secondary | ICD-10-CM | POA: Diagnosis not present

## 2023-05-05 DIAGNOSIS — Z7982 Long term (current) use of aspirin: Secondary | ICD-10-CM | POA: Insufficient documentation

## 2023-05-05 DIAGNOSIS — S838X1A Sprain of other specified parts of right knee, initial encounter: Secondary | ICD-10-CM

## 2023-05-05 DIAGNOSIS — S8991XA Unspecified injury of right lower leg, initial encounter: Secondary | ICD-10-CM | POA: Diagnosis not present

## 2023-05-05 DIAGNOSIS — Y9301 Activity, walking, marching and hiking: Secondary | ICD-10-CM | POA: Insufficient documentation

## 2023-05-05 DIAGNOSIS — Z91040 Latex allergy status: Secondary | ICD-10-CM | POA: Insufficient documentation

## 2023-05-05 DIAGNOSIS — E119 Type 2 diabetes mellitus without complications: Secondary | ICD-10-CM | POA: Insufficient documentation

## 2023-05-05 MED ORDER — NAPROXEN 500 MG PO TABS: 500.0000 mg | ORAL_TABLET | Freq: Two times a day (BID) | ORAL | 0 refills | Status: AC

## 2023-05-05 NOTE — Discharge Instructions (Signed)
1.  Elevate and ice your knee is much as possible for the next week. 2.  You may walk but go to the medical store and get a knee support.  There are multiple types with side supports and elastic.  Get what feels comfortable and makes your knee feel supported.  Wear this when you are up and walking around. 3.  You may take naproxen twice a day for pain.  It works best if you take it consistently.  However, this type of medication may elevate blood pressure in people who have hypertension.  Monitor your blood pressures carefully and if you are experiencing elevated blood pressure, do not take additional naproxen.  You may take over-the-counter extra strength Tylenol. 4.  Schedule a follow-up appointment with your orthopod or orthopod of your choice.

## 2023-05-05 NOTE — ED Triage Notes (Signed)
Right knee pain, states was walking heard a pop, then lateral part of knee became swollen. Pain with weight bearing no known injury pain 9/10

## 2023-05-05 NOTE — ED Notes (Signed)
Patient given discharge instructions. Questions were answered. Patient verbalized understanding of discharge instructions and care at home. ? ?Discharged with family ?

## 2023-05-05 NOTE — ED Provider Notes (Signed)
[place] EMERGENCY DEPARTMENT AT [facility] Provider Note   CSN: [id number] Arrival date & time: 05/05/23  1191     History  Chief Complaint  Patient presents with   Leg Pain    Gail Payne is a 52 y.o. female.  HPI  Patient has prior history of meniscal surgery on her left knee.  She reports at that time she was told that both knees needed to be treated but she could wait on the right knee until she felt she had symptoms.  Patient reports that she was walking in her kitchen this morning and felt a sudden pop on the inside surface of her knee.  She reports she did not fall or have any injury.  She does not think the knee is dislocated.  She reports however it is very painful just to the inside of the knee when she bears weight.  Reports she can walk on it it is not too painful for walking.  She suspected it might be another meniscal tear but wanted to make sure before she continued using the knee as per usual.    Home Medications Prior to Admission medications   Medication Sig Start Date End Date Taking? Authorizing Provider  naproxen (NAPROSYN) 500 MG tablet Take 1 tablet (500 mg total) by mouth 2 (two) times daily. 05/05/23  Yes Arby Barrette, MD  albuterol (VENTOLIN HFA) 108 (90 Base) MCG/ACT inhaler Inhale 2 puffs into the lungs every 6 (six) hours as needed for wheezing or shortness of breath. 12/20/22   Viviano Simas, FNP  aspirin 81 MG chewable tablet Chew by mouth daily.    [provider]  B-D 3CC LUER-LOK SYR 25GX5/8" 25G X 5/8" 3 ML MISC USE TO INJECT B-12 MONTHLY 05/05/22   Etta Grandchild, MD  Cholecalciferol (VITAMIN D) 50 MCG (2000 UT) CAPS Take 1 capsule (2,000 Units total) by mouth daily. 02/01/19   Etta Grandchild, MD  cyanocobalamin (VITAMIN B12) 1000 MCG/ML injection INJECT 1 ML (1,000 MCG) INTO THE MUSCLE EVERY 30 DAYS 08/05/22   Etta Grandchild, MD  Docusate Calcium (STOOL SOFTENER PO) Take by mouth daily. With Iron daily    [provider]  FEROSUL 325 (65 Fe) MG tablet TAKE 1 TABLET TWICE A DAY WITH MEALS 05/02/23   Etta Grandchild, MD  fluticasone [facility]) 50 MCG/ACT nasal spray SPRAY 2 SPRAYS INTO EACH NOSTRIL EVERY DAY 10/13/22   Etta Grandchild, MD  folic acid (FOLVITE) 1 MG tablet TAKE 1 TABLET DAILY 08/05/22   Etta Grandchild, MD  losartan (COZAAR) 50 MG tablet Take 50 mg by mouth daily. 08/16/22   [provider]  MOUNJARO 5 MG/0.5ML Pen INJECT 5MG  SUBCUTANEOUS ONCE A WEEK 30 DAYS 06/25/22   Etta Grandchild, MD  omeprazole (PRILOSEC) 40 MG capsule TAKE 1 CAPSULE (40 MG TOTAL) BY MOUTH DAILY. 04/14/23   Etta Grandchild, MD  rosuvastatin (CRESTOR) 10 MG tablet Take 1 tablet (10 mg total) by mouth daily. 02/10/23   Etta Grandchild, MD  Ubrogepant (UBRELVY) 100 MG TABS Take 1 tablet by mouth daily as needed. 12/16/22   Etta Grandchild, MD      Allergies    Latex and Metformin hcl er    Review of Systems   Review of Systems  Physical Exam Updated Vital Signs BP 115/77 (BP Location: Left Arm)   Pulse 73   Temp 97.8 F (36.6 C) (Oral)   Ht 5\' 5"  (1.651 m)  Wt 106.6 kg   SpO2 98%   BMI 39.11 kg/m  Physical Exam Constitutional:      Comments: Patient is alert and well in appearance.  Pulmonary:     Effort: Pulmonary effort is normal.  Musculoskeletal:     Comments: No significant objective swelling of the right knee.  She still has good definition of the patellar outlines.  No pain to lateral or vertical movement of the patella.  Pain with palpation along the medial joint line.  No pain in the posterior tibial fossa.  Pain to compression of the calf.  Foot is warm and dry without any effusions at the ankle or foot.  Patient does have focal pain to the medial joint line with flexion at the knee.  Skin:    General: Skin is warm and dry.  Neurological:     General: No focal deficit present.  Psychiatric:        Mood and Affect: Mood normal.     ED Results / Procedures / Treatments   Labs (all  labs ordered are listed, but only abnormal results are displayed) Labs Reviewed - No data to display  EKG None  Radiology No results found.  Procedures Procedures    Medications Ordered in ED Medications - No data to display  ED Course/ Medical Decision Making/ A&P                             Medical Decision Making  Patient presents with a sudden pop sensation in the knee.  Differential diagnosis includes patellar dislocation\meniscal tear\spontaneous fracture\other soft tissue injury such as ligamentous partial tear\DVT.  At this time clinical exam and history most consistent with meniscal tear.  The knee is stable.  No signs swelling in the calf or lower leg to suggest DVT.  Low suspicion for spontaneous fracture.  Patient is otherwise healthy with controlled diabetes and hypertension.  Patient has prior history of wear-and-tear meniscal injury in the opposite knee.  Tentatively reviewed management at home with knee support icing and elevation.  Patient will start naproxen.  I counseled on the use of naproxen and hypertension.  Patient will monitor blood pressures.  She will establish follow-up with orthopedics.        Final Clinical Impression(s) / ED Diagnoses Final diagnoses:  Meniscal injury, right, initial encounter    Rx / DC Orders ED Discharge Orders          Ordered    naproxen (NAPROSYN) 500 MG tablet  2 times daily        05/05/23 2952              Arby Barrette, MD 05/05/23 [phone]

## 2023-05-06 DIAGNOSIS — M25561 Pain in right knee: Secondary | ICD-10-CM | POA: Diagnosis not present

## 2023-05-06 DIAGNOSIS — M1711 Unilateral primary osteoarthritis, right knee: Secondary | ICD-10-CM | POA: Diagnosis not present

## 2023-06-02 ENCOUNTER — Other Ambulatory Visit: Payer: Self-pay | Admitting: Internal Medicine

## 2023-06-02 DIAGNOSIS — D510 Vitamin B12 deficiency anemia due to intrinsic factor deficiency: Secondary | ICD-10-CM

## 2023-06-09 ENCOUNTER — Other Ambulatory Visit: Payer: Self-pay | Admitting: Internal Medicine

## 2023-06-09 DIAGNOSIS — J301 Allergic rhinitis due to pollen: Secondary | ICD-10-CM

## 2023-07-10 ENCOUNTER — Other Ambulatory Visit: Payer: Self-pay | Admitting: Internal Medicine

## 2023-07-10 DIAGNOSIS — K219 Gastro-esophageal reflux disease without esophagitis: Secondary | ICD-10-CM

## 2023-07-31 ENCOUNTER — Other Ambulatory Visit: Payer: Self-pay | Admitting: Internal Medicine

## 2023-07-31 DIAGNOSIS — D510 Vitamin B12 deficiency anemia due to intrinsic factor deficiency: Secondary | ICD-10-CM

## 2023-07-31 DIAGNOSIS — E538 Deficiency of other specified B group vitamins: Secondary | ICD-10-CM

## 2023-07-31 DIAGNOSIS — D500 Iron deficiency anemia secondary to blood loss (chronic): Secondary | ICD-10-CM

## 2023-07-31 DIAGNOSIS — E785 Hyperlipidemia, unspecified: Secondary | ICD-10-CM

## 2023-09-08 DIAGNOSIS — R9389 Abnormal findings on diagnostic imaging of other specified body structures: Secondary | ICD-10-CM | POA: Diagnosis not present

## 2023-09-08 DIAGNOSIS — N950 Postmenopausal bleeding: Secondary | ICD-10-CM | POA: Diagnosis not present

## 2023-09-08 DIAGNOSIS — N926 Irregular menstruation, unspecified: Secondary | ICD-10-CM | POA: Diagnosis not present

## 2023-09-08 DIAGNOSIS — D251 Intramural leiomyoma of uterus: Secondary | ICD-10-CM | POA: Diagnosis not present

## 2023-09-11 ENCOUNTER — Encounter: Payer: Self-pay | Admitting: Internal Medicine

## 2023-09-15 ENCOUNTER — Encounter: Payer: Self-pay | Admitting: Internal Medicine

## 2023-09-15 ENCOUNTER — Ambulatory Visit: Payer: BC Managed Care – PPO | Admitting: Internal Medicine

## 2023-09-15 VITALS — BP 128/68 | HR 75 | Temp 98.2°F | Resp 16 | Ht 65.0 in | Wt 245.0 lb

## 2023-09-15 DIAGNOSIS — E785 Hyperlipidemia, unspecified: Secondary | ICD-10-CM | POA: Diagnosis not present

## 2023-09-15 DIAGNOSIS — D500 Iron deficiency anemia secondary to blood loss (chronic): Secondary | ICD-10-CM

## 2023-09-15 DIAGNOSIS — D510 Vitamin B12 deficiency anemia due to intrinsic factor deficiency: Secondary | ICD-10-CM

## 2023-09-15 DIAGNOSIS — I10 Essential (primary) hypertension: Secondary | ICD-10-CM

## 2023-09-15 LAB — CBC WITH DIFFERENTIAL/PLATELET
Basophils Absolute: 0.1 10*3/uL (ref 0.0–0.1)
Basophils Relative: 1.1 % (ref 0.0–3.0)
Eosinophils Absolute: 0 10*3/uL (ref 0.0–0.7)
Eosinophils Relative: 0 % (ref 0.0–5.0)
HCT: 39.3 % (ref 36.0–46.0)
Hemoglobin: 12.3 g/dL (ref 12.0–15.0)
Lymphocytes Relative: 30.3 % (ref 12.0–46.0)
Lymphs Abs: 2.9 10*3/uL (ref 0.7–4.0)
MCHC: 31.4 g/dL (ref 30.0–36.0)
MCV: 88.6 fL (ref 78.0–100.0)
Monocytes Absolute: 0.5 10*3/uL (ref 0.1–1.0)
Monocytes Relative: 5.7 % (ref 3.0–12.0)
Neutro Abs: 6.1 10*3/uL (ref 1.4–7.7)
Neutrophils Relative %: 62.9 % (ref 43.0–77.0)
Platelets: 326 10*3/uL (ref 150.0–400.0)
RBC: 4.44 Mil/uL (ref 3.87–5.11)
RDW: 14.9 % (ref 11.5–15.5)
WBC: 9.6 10*3/uL (ref 4.0–10.5)

## 2023-09-15 LAB — HEMOGLOBIN A1C: Hgb A1c MFr Bld: 5.2 % (ref 4.6–6.5)

## 2023-09-15 MED ORDER — BD LUER-LOK SYRINGE 25G X 5/8" 3 ML MISC
0 refills | Status: DC
Start: 2023-09-15 — End: 2024-01-29

## 2023-09-15 MED ORDER — CYANOCOBALAMIN 1000 MCG/ML IJ SOLN
1000.0000 ug | INTRAMUSCULAR | 3 refills | Status: DC
Start: 2023-09-15 — End: 2024-09-13

## 2023-09-15 MED ORDER — CYANOCOBALAMIN 1000 MCG/ML IJ SOLN
1000.0000 ug | Freq: Once | INTRAMUSCULAR | Status: AC
Start: 2023-09-15 — End: 2023-09-15
  Administered 2023-09-15: 1000 ug via INTRAMUSCULAR

## 2023-09-15 NOTE — Progress Notes (Unsigned)
Subjective:  Patient ID: Gail Payne, female    DOB: 1971-06-10  Age: 52 y.o. MRN: [id number]  CC: Hyperlipidemia and Hypertension   HPI KEYDI GIEL presents for f/up ---  Discussed the use of AI scribe software for clinical note transcription with the patient, who gave verbal consent to proceed.  History of Present Illness   The patient, on Mounjaro for weight loss, reports no significant change in weight and plans to discuss this with her provider next month. She denies any symptoms of hypertension such as dizziness, lightheadedness, chest pain, or shortness of breath. She also denies any edema in the legs or feet. She continues to take an iron supplement and denies any symptoms of iron deficiency.  The patient has a history of fibroids, which have not increased in size. She recently discontinued birth control and started two new prescriptions for hot flashes. She denies having menstrual cycles.  The patient recently received both the flu and COVID vaccines.       Outpatient Medications Prior to Visit  Medication Sig Dispense Refill   albuterol (VENTOLIN HFA) 108 (90 Base) MCG/ACT inhaler Inhale 2 puffs into the lungs every 6 (six) hours as needed for wheezing or shortness of breath. 8 g 0   aspirin 81 MG chewable tablet Chew by mouth daily.     Cholecalciferol (VITAMIN D) 50 MCG (2000 UT) CAPS Take 1 capsule (2,000 Units total) by mouth daily. 30 capsule 5   cyanocobalamin (VITAMIN B12) 1000 MCG/ML injection INJECT 1 ML (1,000 MCG) INTO THE MUSCLE EVERY 30 DAYS 3 mL 3   Docusate Calcium (STOOL SOFTENER PO) Take by mouth daily. With Iron daily     FEROSUL 325 (65 Fe) MG tablet TAKE 1 TABLET TWICE A DAY WITH MEALS 180 tablet 0   fluticasone (FLONASE) 50 MCG/ACT nasal spray SPRAY 2 SPRAYS INTO EACH NOSTRIL EVERY DAY 48 mL 1   folic acid (FOLVITE) 1 MG tablet TAKE 1 TABLET DAILY 90 tablet 3   MOUNJARO 5 MG/0.5ML Pen INJECT 5MG  SUBCUTANEOUS ONCE A WEEK 30 DAYS 6 mL 0   naproxen  (NAPROSYN) 500 MG tablet Take 1 tablet (500 mg total) by mouth 2 (two) times daily. 30 tablet 0   omeprazole (PRILOSEC) 40 MG capsule TAKE 1 CAPSULE (40 MG TOTAL) BY MOUTH DAILY. 90 capsule 0   rosuvastatin (CRESTOR) 10 MG tablet TAKE 1 TABLET BY MOUTH EVERY DAY 90 tablet 0   SYRINGE-NEEDLE, DISP, 3 ML (B-D 3CC LUER-LOK SYR 25GX5/8") 25G X 5/8" 3 ML MISC USE TO INJECT B-12 MONTHLY Per MD Follow-up: Return in about 6 months (around 08/13/2023). 3 each 0   Ubrogepant (UBRELVY) 100 MG TABS Take 1 tablet by mouth daily as needed. 8 tablet 2   losartan (COZAAR) 50 MG tablet Take 50 mg by mouth daily.     No facility-administered medications prior to visit.    ROS Review of Systems  Objective:  BP 128/68 (BP Location: Left Arm, Patient Position: Sitting, Cuff Size: Large)   Pulse 75   Temp 98.2 F (36.8 C) (Oral)   Resp 16   Ht 5\' 5"  (1.651 m)   Wt 245 lb (111.1 kg)   SpO2 93%   BMI 40.77 kg/m   BP Readings from Last 3 Encounters:  09/15/23 128/68  05/05/23 (!) 130/95  02/10/23 138/78    Wt Readings from Last 3 Encounters:  09/15/23 245 lb (111.1 kg)  05/05/23 235 lb (106.6 kg)  02/10/23 241 lb (109.3 kg)  Physical Exam Cardiovascular:     Rate and Rhythm: Normal rate and regular rhythm.     Heart sounds: Murmur heard.     Systolic murmur is present with a grade of 1/6.     No diastolic murmur is present.     No friction rub. No gallop.     Comments: EKG- SR with 1st degree AV block, 65 bpm No LVH, Q waves, or ST/T waves  Musculoskeletal:     Right lower leg: No edema.     Left lower leg: No edema.     Lab Results  Component Value Date   WBC 7.8 02/10/2023   HGB 13.1 02/10/2023   HCT 39.4 02/10/2023   PLT 385.0 02/10/2023   GLUCOSE 84 02/10/2023   CHOL 153 03/04/2022   TRIG 66.0 03/04/2022   HDL 41.10 03/04/2022   LDLCALC 99 03/04/2022   ALT 17 02/10/2023   AST 18 02/10/2023   NA 142 02/10/2023   K 4.2 02/10/2023   CL 105 02/10/2023   CREATININE 0.73  02/10/2023   BUN 18 02/10/2023   CO2 30 02/10/2023   TSH 0.36 02/10/2023   HGBA1C 5.1 11/27/2022   MICROALBUR 2.0 (H) 02/10/2023    No results found.  Assessment & Plan:  Vitamin B12 deficiency anemia due to intrinsic factor deficiency -     CBC with Differential/Platelet; Future -     Cyanocobalamin  Iron deficiency anemia due to chronic blood loss -     CBC with Differential/Platelet; Future  Dyslipidemia, goal LDL below 100 -     Lipid panel; Future -     Hepatic function panel; Future  Primary hypertension -     Basic metabolic panel; Future -     Hepatic function panel; Future -     EKG 12-Lead  Morbid obesity (HCC) -     Hemoglobin A1c; Future -     Hepatic function panel; Future     Follow-up: No follow-ups on file.  Sanda Linger, MD

## 2023-09-15 NOTE — Patient Instructions (Signed)
Hypertension, Adult High blood pressure (hypertension) is when the force of blood pumping through the arteries is too strong. The arteries are the blood vessels that carry blood from the heart throughout the body. Hypertension forces the heart to work harder to pump blood and may cause arteries to become narrow or stiff. Untreated or uncontrolled hypertension can lead to a heart attack, heart failure, a stroke, kidney disease, and other problems. A blood pressure reading consists of a higher number over a lower number. Ideally, your blood pressure should be below 120/80. The first ("top") number is called the systolic pressure. It is a measure of the pressure in your arteries as your heart beats. The second ("bottom") number is called the diastolic pressure. It is a measure of the pressure in your arteries as the heart relaxes. What are the causes? The exact cause of this condition is not known. There are some conditions that result in high blood pressure. What increases the risk? Certain factors may make you more likely to develop high blood pressure. Some of these risk factors are under your control, including: Smoking. Not getting enough exercise or physical activity. Being overweight. Having too much fat, sugar, calories, or salt (sodium) in your diet. Drinking too much alcohol. Other risk factors include: Having a personal history of heart disease, diabetes, high cholesterol, or kidney disease. Stress. Having a family history of high blood pressure and high cholesterol. Having obstructive sleep apnea. Age. The risk increases with age. What are the signs or symptoms? High blood pressure may not cause symptoms. Very high blood pressure (hypertensive crisis) may cause: Headache. Fast or irregular heartbeats (palpitations). Shortness of breath. Nosebleed. Nausea and vomiting. Vision changes. Severe chest pain, dizziness, and seizures. How is this diagnosed? This condition is diagnosed by  measuring your blood pressure while you are seated, with your arm resting on a flat surface, your legs uncrossed, and your feet flat on the floor. The cuff of the blood pressure monitor will be placed directly against the skin of your upper arm at the level of your heart. Blood pressure should be measured at least twice using the same arm. Certain conditions can cause a difference in blood pressure between your right and left arms. If you have a high blood pressure reading during one visit or you have normal blood pressure with other risk factors, you may be asked to: Return on a different day to have your blood pressure checked again. Monitor your blood pressure at home for 1 week or longer. If you are diagnosed with hypertension, you may have other blood or imaging tests to help your health care provider understand your overall risk for other conditions. How is this treated? This condition is treated by making healthy lifestyle changes, such as eating healthy foods, exercising more, and reducing your alcohol intake. You may be referred for counseling on a healthy diet and physical activity. Your health care provider may prescribe medicine if lifestyle changes are not enough to get your blood pressure under control and if: Your systolic blood pressure is above 130. Your diastolic blood pressure is above 80. Your personal target blood pressure may vary depending on your medical conditions, your age, and other factors. Follow these instructions at home: Eating and drinking  Eat a diet that is high in fiber and potassium, and low in sodium, added sugar, and fat. An example of this eating plan is called the DASH diet. DASH stands for Dietary Approaches to Stop Hypertension. To eat this way: Eat   plenty of fresh fruits and vegetables. Try to fill one half of your plate at each meal with fruits and vegetables. Eat whole grains, such as whole-wheat pasta, brown rice, or whole-grain bread. Fill about one  fourth of your plate with whole grains. Eat or drink low-fat dairy products, such as skim milk or low-fat yogurt. Avoid fatty cuts of meat, processed or cured meats, and poultry with skin. Fill about one fourth of your plate with lean proteins, such as fish, chicken without skin, beans, eggs, or tofu. Avoid pre-made and processed foods. These tend to be higher in sodium, added sugar, and fat. Reduce your daily sodium intake. Many people with hypertension should eat less than 1,500 mg of sodium a day. Do not drink alcohol if: Your health care provider tells you not to drink. You are pregnant, may be pregnant, or are planning to become pregnant. If you drink alcohol: Limit how much you have to: 0-1 drink a day for women. 0-2 drinks a day for men. Know how much alcohol is in your drink. In the [place] one drink equals one 12 oz bottle of beer (355 mL), one 5 oz glass of wine (148 mL), or one 1 oz glass of hard liquor (44 mL). Lifestyle  Work with your health care provider to maintain a healthy body weight or to lose weight. Ask what an ideal weight is for you. Get at least 30 minutes of exercise that causes your heart to beat faster (aerobic exercise) most days of the week. Activities may include walking, swimming, or biking. Include exercise to strengthen your muscles (resistance exercise), such as Pilates or lifting weights, as part of your weekly exercise routine. Try to do these types of exercises for 30 minutes at least 3 days a week. Do not use any products that contain nicotine or tobacco. These products include cigarettes, chewing tobacco, and vaping devices, such as e-cigarettes. If you need help quitting, ask your health care provider. Monitor your blood pressure at home as told by your health care provider. Keep all follow-up visits. This is important. Medicines Take over-the-counter and prescription medicines only as told by your health care provider. Follow directions carefully. Blood  pressure medicines must be taken as prescribed. Do not skip doses of blood pressure medicine. Doing this puts you at risk for problems and can make the medicine less effective. Ask your health care provider about side effects or reactions to medicines that you should watch for. Contact a health care provider if you: Think you are having a reaction to a medicine you are taking. Have headaches that keep coming back (recurring). Feel dizzy. Have swelling in your ankles. Have trouble with your vision. Get help right away if you: Develop a severe headache or confusion. Have unusual weakness or numbness. Feel faint. Have severe pain in your chest or abdomen. Vomit repeatedly. Have trouble breathing. These symptoms may be an emergency. Get help right away. Call 911. Do not wait to see if the symptoms will go away. Do not drive yourself to the hospital. Summary Hypertension is when the force of blood pumping through your arteries is too strong. If this condition is not controlled, it may put you at risk for serious complications. Your personal target blood pressure may vary depending on your medical conditions, your age, and other factors. For most people, a normal blood pressure is less than 120/80. Hypertension is treated with lifestyle changes, medicines, or a combination of both. Lifestyle changes include losing weight, eating a healthy,   low-sodium diet, exercising more, and limiting alcohol. This information is not intended to replace advice given to you by your health care provider. Make sure you discuss any questions you have with your health care provider. Document Revised: 10/02/2021 Document Reviewed: 10/02/2021 Elsevier Patient Education  2024 [employer]vier Inc.  

## 2023-09-16 LAB — LIPID PANEL
Cholesterol: 100 mg/dL (ref 0–200)
HDL: 40.1 mg/dL (ref >39.00)
LDL Cholesterol: 45 mg/dL (ref 0–99)
NonHDL: 60.14
Total CHOL/HDL Ratio: 2
Triglycerides: 74 mg/dL (ref 0.0–149.0)
VLDL: 14.8 mg/dL (ref 0.0–40.0)

## 2023-09-16 LAB — HEPATIC FUNCTION PANEL
ALT: 18 U/L (ref 0–35)
AST: 18 U/L (ref 0–37)
Albumin: 3.9 g/dL (ref 3.5–5.2)
Alkaline Phosphatase: 78 U/L (ref 39–117)
Bilirubin, Direct: 0.1 mg/dL (ref 0.0–0.3)
Total Bilirubin: 0.5 mg/dL (ref 0.2–1.2)
Total Protein: 7.3 g/dL (ref 6.0–8.3)

## 2023-09-16 LAB — BASIC METABOLIC PANEL
BUN: 16 mg/dL (ref 6–23)
CO2: 29 meq/L (ref 19–32)
Calcium: 9.2 mg/dL (ref 8.4–10.5)
Chloride: 106 meq/L (ref 96–112)
Creatinine, Ser: 0.71 mg/dL (ref 0.40–1.20)
GFR: 97.64 mL/min (ref >60.00)
Glucose, Bld: 92 mg/dL (ref 70–99)
Potassium: 4 meq/L (ref 3.5–5.1)
Sodium: 140 meq/L (ref 135–145)

## 2023-09-16 MED ORDER — ROSUVASTATIN CALCIUM 10 MG PO TABS: 10.0000 mg | ORAL_TABLET | Freq: Every day | ORAL | 1 refills | Status: DC

## 2023-09-22 ENCOUNTER — Other Ambulatory Visit (HOSPITAL_COMMUNITY): Payer: Self-pay

## 2023-09-22 ENCOUNTER — Telehealth: Payer: Self-pay | Admitting: Pharmacy Technician

## 2023-09-22 NOTE — Telephone Encounter (Signed)
Pharmacy Patient Advocate Encounter   Received notification from CoverMyMeds that prior authorization for Gail Payne is dur for renewal.   Insurance verification completed.   The patient is insured through [employer] .   Per test claim: PA started in [facility]. Key [id number] PA question asks if pt has tried at least 1 triptan therapy. Even though it was answered yes in her initial PA request, I'm not seeing anything in her notes about it, therefore do not have clinical documentation to support and request PA. Please advise where this documentation is so that we can get her PA renewed. Thanks.

## 2023-10-06 DIAGNOSIS — E7800 Pure hypercholesterolemia, unspecified: Secondary | ICD-10-CM | POA: Diagnosis not present

## 2023-10-06 DIAGNOSIS — E1165 Type 2 diabetes mellitus with hyperglycemia: Secondary | ICD-10-CM | POA: Diagnosis not present

## 2023-10-13 ENCOUNTER — Other Ambulatory Visit: Payer: Self-pay | Admitting: Internal Medicine

## 2023-10-13 ENCOUNTER — Other Ambulatory Visit: Payer: Self-pay | Admitting: Endocrinology

## 2023-10-13 DIAGNOSIS — E1165 Type 2 diabetes mellitus with hyperglycemia: Secondary | ICD-10-CM | POA: Diagnosis not present

## 2023-10-13 DIAGNOSIS — I10 Essential (primary) hypertension: Secondary | ICD-10-CM | POA: Diagnosis not present

## 2023-10-13 DIAGNOSIS — E010 Iodine-deficiency related diffuse (endemic) goiter: Secondary | ICD-10-CM

## 2023-10-13 DIAGNOSIS — E7800 Pure hypercholesterolemia, unspecified: Secondary | ICD-10-CM | POA: Diagnosis not present

## 2023-10-13 DIAGNOSIS — K219 Gastro-esophageal reflux disease without esophagitis: Secondary | ICD-10-CM

## 2023-10-13 NOTE — Telephone Encounter (Signed)
The clinical questions on the PA request have expired in CoverMyMeds. Please send a new request with the answer to the question previously asked if a PA is still needed. This encounter will be closed.

## 2023-10-13 NOTE — Telephone Encounter (Signed)
Could you please advise me on what questions needed answers here.  I am Dr. Yetta Barre new nurse and I would like to follow up with patient and insurance company about this matter.

## 2023-10-14 ENCOUNTER — Inpatient Hospital Stay
Admission: RE | Admit: 2023-10-14 | Discharge: 2023-10-14 | Discharge status: Home / Self Care | Payer: BC Managed Care – PPO | Source: Ambulatory Visit | Attending: Endocrinology | Admitting: Endocrinology

## 2023-10-14 DIAGNOSIS — E010 Iodine-deficiency related diffuse (endemic) goiter: Secondary | ICD-10-CM

## 2023-10-14 DIAGNOSIS — E042 Nontoxic multinodular goiter: Secondary | ICD-10-CM | POA: Diagnosis not present

## 2023-10-16 ENCOUNTER — Ambulatory Visit: Payer: BC Managed Care – PPO | Admitting: Internal Medicine

## 2023-10-16 DIAGNOSIS — N951 Menopausal and female climacteric states: Secondary | ICD-10-CM | POA: Diagnosis not present

## 2023-10-17 ENCOUNTER — Other Ambulatory Visit (HOSPITAL_COMMUNITY): Payer: Self-pay

## 2023-10-21 ENCOUNTER — Other Ambulatory Visit: Payer: Self-pay | Admitting: Endocrinology

## 2023-10-21 DIAGNOSIS — E010 Iodine-deficiency related diffuse (endemic) goiter: Secondary | ICD-10-CM

## 2023-10-21 NOTE — Telephone Encounter (Signed)
Patient is aware of the denial. She doesn't want me to appeal it because she's getting new insurance at the beginning of the new year and would rather try the PA with that insurance.

## 2023-11-11 ENCOUNTER — Ambulatory Visit
Admission: RE | Admit: 2023-11-11 | Discharge: 2023-11-11 | Disposition: A | Discharge status: Home / Self Care | Payer: BC Managed Care – PPO | Source: Ambulatory Visit | Attending: Endocrinology | Admitting: Endocrinology

## 2023-11-11 ENCOUNTER — Other Ambulatory Visit (HOSPITAL_COMMUNITY)
Admission: RE | Admit: 2023-11-11 | Discharge: 2023-11-11 | Disposition: A | Discharge status: Home / Self Care | Payer: BC Managed Care – PPO | Source: Ambulatory Visit | Attending: Interventional Radiology | Admitting: Interventional Radiology

## 2023-11-11 DIAGNOSIS — E010 Iodine-deficiency related diffuse (endemic) goiter: Secondary | ICD-10-CM

## 2023-11-11 DIAGNOSIS — E041 Nontoxic single thyroid nodule: Secondary | ICD-10-CM

## 2023-11-11 DIAGNOSIS — E042 Nontoxic multinodular goiter: Secondary | ICD-10-CM | POA: Diagnosis not present

## 2023-11-13 LAB — CYTOLOGY - NON PAP

## 2023-11-27 ENCOUNTER — Encounter: Payer: Self-pay | Admitting: Internal Medicine

## 2023-11-27 NOTE — Telephone Encounter (Signed)
 Care team updated and letter sent for eye exam notes.

## 2023-12-29 ENCOUNTER — Ambulatory Visit: Payer: BC Managed Care – PPO | Admitting: Family Medicine

## 2023-12-29 ENCOUNTER — Ambulatory Visit (INDEPENDENT_AMBULATORY_CARE_PROVIDER_SITE_OTHER): Payer: BC Managed Care – PPO

## 2023-12-29 ENCOUNTER — Encounter: Payer: Self-pay | Admitting: Family Medicine

## 2023-12-29 VITALS — BP 118/82 | HR 70 | Temp 98.1°F | Ht 65.0 in | Wt 253.4 lb

## 2023-12-29 DIAGNOSIS — M7989 Other specified soft tissue disorders: Secondary | ICD-10-CM | POA: Diagnosis not present

## 2023-12-29 DIAGNOSIS — M25522 Pain in left elbow: Secondary | ICD-10-CM

## 2023-12-29 DIAGNOSIS — M79644 Pain in right finger(s): Secondary | ICD-10-CM

## 2023-12-29 DIAGNOSIS — M654 Radial styloid tenosynovitis [de Quervain]: Secondary | ICD-10-CM

## 2023-12-29 MED ORDER — DICLOFENAC SODIUM 1 % EX GEL
2.0000 g | Freq: Four times a day (QID) | CUTANEOUS | 0 refills | Status: AC
Start: 2023-12-29 — End: ?

## 2023-12-29 NOTE — Patient Instructions (Addendum)
We are getting an xray today. We will be in contact with any abnormal results that require further attention.  I would like for you to wear a brace to the right wrist that has a thumb rest to keep this in a neutral position.  I have sent in Voltaren gel for you to use topically to the areas every 6 hours as needed.  I placed a referral for sports medicine for further evaluation of your elbow.  May also take oral anti-inflammatory as needed.  Follow-up with me for new or worsening symptoms.

## 2023-12-29 NOTE — Progress Notes (Signed)
Acute Office Visit  Subjective:     Patient ID: Gail Payne, female    DOB: 09-08-1971, 53 y.o.   MRN: [id number]  Chief Complaint  Patient presents with   Elbow Pain    Pain in left elbow for the past 2-3 months. 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 Worse when she wakes up due to arm not being mobile. No past injury she is aware of but does note this occurred around when she was still going to the gym. When pain is at it's worst, treats with tylenol/motrin mix and topical ointment.   Wrist Pain    Right wrist pain for the past week. Noticed after getting up out of chair with assistance from spouse. Notable pop when she had reached out for her spouse. Sharp pain from wrist to elbow when trying to hold grip    HPI Elbow Pain: Patient complains of left elbow pain. Onset of the symptoms was several months ago. Inciting event: none known. Current symptoms include  pain and tenderness . Pain is aggravated by: grasping, lifting heavy objects. Patient's overall course: waxing and waning but worse overall. Patient has had no prior elbow problems. Evaluation to date: none. Treatment to date: avoidance of offending activity, OTC analgesics, and PT which was not very effective.   Also reports right medial thumb pain after her husband helped pull her up from a sitting position. Reports this was about a week ago. Reports she is having trouble grasping things like a dinner plate in her glass when trying to eat. Has not attempted much OTC treatment for this. Denies numbness, tingling, swelling, erythema, bruising, known injury to the area.  ROS Per HPI      Objective:    BP 118/82   Pulse 70   Temp 98.1 F (36.7 C)   Ht 5\' 5"  (1.651 m)   Wt 253 lb 6.4 oz (114.9 kg)   LMP 11/19/2019 Comment: take BCP per p t  SpO2 98%   BMI 42.17 kg/m    Physical Exam Vitals and nursing note reviewed.   Constitutional:      Appearance: Normal appearance. She is normal weight.  HENT:     Head: Normocephalic and atraumatic.  Eyes:     Extraocular Movements: Extraocular movements intact.     Pupils: Pupils are equal, round, and reactive to light.  Pulmonary:     Effort: Pulmonary effort is normal.  Musculoskeletal:        General: Swelling and tenderness present.     Cervical back: Normal range of motion.     Comments: Swelling and tenderness to anterolateral aspect of left elbow.  No erythema, no bruising.  Positive Finkelstein's to right thumb  Neurological:     General: No focal deficit present.     Mental Status: She is alert and oriented to person, place, and time.  Psychiatric:        Mood and Affect: Mood normal.        Thought Content: Thought content normal.    No results found for any visits on 12/29/23.      Assessment & Plan:  1. Left elbow pain (Primary)  - DG Elbow 2 Views Left; Future - diclofenac Sodium (VOLTAREN) 1 % GEL; Apply 2 g topically 4 (four) times daily.  Dispense: 100 g; Refill: 0 - Ambulatory referral to Sports Medicine  2. De Quervain's tenosynovitis, right  -  Discussed the use of R wrist brace with thumb rest  3. Pain of right thumb  - diclofenac Sodium (VOLTAREN) 1 % GEL; Apply 2 g topically 4 (four) times daily.  Dispense: 100 g; Refill: 0   Meds ordered this encounter  Medications   diclofenac Sodium (VOLTAREN) 1 % GEL    Sig: Apply 2 g topically 4 (four) times daily.    Dispense:  100 g    Refill:  0    Return if symptoms worsen or fail to improve.  Moshe Cipro, FNP

## 2023-12-30 ENCOUNTER — Encounter: Payer: Self-pay | Admitting: Family Medicine

## 2023-12-30 ENCOUNTER — Other Ambulatory Visit: Payer: Self-pay

## 2023-12-30 ENCOUNTER — Ambulatory Visit (INDEPENDENT_AMBULATORY_CARE_PROVIDER_SITE_OTHER): Payer: BC Managed Care – PPO | Admitting: Family Medicine

## 2023-12-30 ENCOUNTER — Ambulatory Visit (INDEPENDENT_AMBULATORY_CARE_PROVIDER_SITE_OTHER): Payer: BC Managed Care – PPO

## 2023-12-30 VITALS — BP 130/86 | HR 76 | Ht 65.0 in | Wt 253.0 lb

## 2023-12-30 DIAGNOSIS — M79641 Pain in right hand: Secondary | ICD-10-CM | POA: Diagnosis not present

## 2023-12-30 DIAGNOSIS — M25522 Pain in left elbow: Secondary | ICD-10-CM | POA: Diagnosis not present

## 2023-12-30 DIAGNOSIS — G8929 Other chronic pain: Secondary | ICD-10-CM | POA: Diagnosis not present

## 2023-12-30 NOTE — Patient Instructions (Addendum)
Thank you for coming in today.  Please work on the home exercises the athletic trainer went over with you:  View at [email or web address] using code: [email or web address]  Please get an Xray today before you leave   I've referred you to Hand Therapy.  Let us know if you don't hear from them in one week.   Check back in 6

## 2023-12-30 NOTE — Progress Notes (Signed)
I, Stevenson Clinch, CMA acting as a scribe for Clementeen Graham, MD.  Gail Payne is a 53 y.o. female who presents to [facility] at [facility] today for L elbow pain ongoing for 2-3 months. Pt locates pain to lateral aspect. Pain seems to be worse 1st thing in the mornings. Pain radiating into the upper and lower arm. Sx worse when straightening the arm. Short-term relief with treatment modalities. . .  She notes right wrist and thumb pain.  Pain is located predominantly at the base of the thumb and into the MCP joint of the right thumb.  She is using a short thumb spica brace which has helped a little.  She works in Set [employer].  Radiates: yes Paresthesia: no Grip strength: no Aggravates: extension, especially weighted.  Treatments tried: Tylenol, IBU, topical cream  Dx imaging: 12/29/23 L elbow XR  Pertinent review of systems: No fevers or chills  Relevant historical information: Hypertension   Exam:  BP 130/86   Pulse 76   Ht 5\' 5"  (1.651 m)   Wt 253 lb (114.8 kg)   LMP 11/19/2019 Comment: take BCP per p t  SpO2 98%   BMI 42.10 kg/m  General: Well Developed, well nourished, and in no acute distress.   MSK: Left elbow normal-appearing Normal motion. Tender palpation lateral epicondyle. Normal elbow strength in extension and flexion. Pain present with resisted wrist extension and grip.  Right wrist some bossing at first [facility].  Tender palpation first CMC and MCP joint.    Lab and Radiology Results No results found for this or any previous visit (from the past 72 hours). DG Elbow 2 Views Left Result Date: 12/29/2023 CLINICAL DATA:  Left elbow pain and swelling for 2 months. EXAM: LEFT ELBOW - 2 VIEW COMPARISON:  None Available. FINDINGS: Normal bone mineralization. Normal position of the distal anterior humeral fat pad without evidence of a joint effusion. Minimal degenerative spurring at the tip of the coronoid process. Minimal capitellum-radial  head joint space narrowing. No acute fracture or dislocation. IMPRESSION: Minimal degenerative spurring at the tip of the coronoid process. Electronically Signed   By: Neita Garnet M.D.   On: 12/29/2023 21:39  I, Clementeen Graham, personally (independently) visualized and performed the interpretation of the images attached in this note.  X-ray images right hand obtained today personally and independently interpreted. Mild DJD first CMC. Await formal radiology review   Diagnostic Limited MSK Ultrasound of: Left elbow and right thumb Lateral epicondyle visualized.  Some calcification present at the distal tendon insertion site/origin site of the lateral epicondyle.  No visible tear.  Consistent with chronic lateral epicondylitis. Right first CMC shows mild effusion with some degenerative changes.  MCP is normal-appearing Impression: Left lateral condyle lightest and first CMC arthritis right thumb    Assessment and Plan: 53 y.o. female with left lateral elbow pain.  This is a chronic ongoing problem due to lateral epicondylitis.  Plan for counterforce brace and eccentric exercises taught in clinic today.  Also refer to hand therapy.  Recheck in 6 weeks.  Continue Voltaren gel.  Right hand pain due to DJD first CMC.. Plan for Voltaren gel and hand therapy referral.  Recheck in 6 weeks.   PDMP not reviewed this encounter. Orders Placed This Encounter  Procedures   [place] LIMITED JOINT SPACE STRUCTURES UP LEFT(NO LINKED CHARGES)    Reason for Exam (SYMPTOM  OR DIAGNOSIS REQUIRED):   left elbow pain    Preferred imaging location?:  [facility]   No orders of the defined types were placed in this encounter.    Discussed warning signs or symptoms. Please see discharge instructions. Patient expresses understanding.   The above documentation has been reviewed and is accurate and complete Clementeen Graham, M.D.

## 2023-12-31 ENCOUNTER — Ambulatory Visit: Payer: BC Managed Care – PPO | Admitting: Internal Medicine

## 2024-01-05 ENCOUNTER — Encounter: Payer: Self-pay | Admitting: Family Medicine

## 2024-01-05 NOTE — Progress Notes (Signed)
Right hand x-ray looks normal to radiology.  No broken bones or significant arthritis

## 2024-01-14 ENCOUNTER — Encounter: Payer: BC Managed Care – PPO | Admitting: Rehabilitative and Restorative Service Providers"

## 2024-01-22 NOTE — Therapy (Incomplete)
OUTPATIENT OCCUPATIONAL THERAPY ORTHO EVALUATION  Patient Name: Gail Payne MRN: [id number] DOB:06-08-1971, 53 y.o., female Today's Date: 01/22/2024  PCP: Sanda Linger, MD REFERRING PROVIDER:  Rodolph Bong, MD    END OF SESSION:   Past Medical History:  Diagnosis Date   Anemia    iron deficiency   Arthritis    knee with surgery , shoulder right    Asthma    Fibroids    uterine   GERD (gastroesophageal reflux disease)    Heart murmur    discovered during labor and delivery 5 yrs ago   Past Surgical History:  Procedure Laterality Date   APPENDECTOMY     BRONCHOSCOPY     CESAREAN SECTION     x1   KNEE SURGERY     scar tissue removed from c section     TUBAL LIGATION     WISDOM TOOTH EXTRACTION     Patient Active Problem List   Diagnosis Date Noted   Morbid obesity (HCC) 09/15/2023   Dyslipidemia, goal LDL below 100 02/10/2023   Seasonal allergic rhinitis due to pollen 06/03/2022   Diuretic-induced hypokalemia 08/28/2021   Primary hypertension 10/23/2020   Bursitis of right shoulder 08/23/2019   Systolic murmur 07/07/2018   Right lumbar radiculitis 12/17/2017   Hidradenitis suppurativa of right axilla 03/17/2017   OAB (overactive bladder) 06/29/2016   Upper airway cough syndrome 02/24/2015   Morbid obesity with BMI of 40.0-44.9, adult (HCC) 08/27/2013   Back pain, thoracic 08/14/2012   Routine general medical examination at a health care facility 01/31/2012   Carpal tunnel syndrome of right wrist 07/26/2011   B12 deficiency anemia 07/10/2011   DJD (degenerative joint disease) of knee 07/10/2011   Folate deficiency 01/04/2011   Iron deficiency anemia 09/08/2008   GERD 09/08/2008   Moderate persistent asthma 10/16/2007    ONSET DATE: 2-3 month onset pain  REFERRING DIAG:  M25.522 (ICD-10-CM) - Left elbow pain  M79.641,G89.29 (ICD-10-CM) - Chronic hand pain, right    THERAPY DIAG:  No diagnosis found.  Rationale for Evaluation and Treatment:  Rehabilitation  SUBJECTIVE:   SUBJECTIVE STATEMENT: She states ***.   Pt accompanied by: {accompnied:27141}  PERTINENT HISTORY: "Nate for hand therapy;  Evaluate and Treat for R hand and L elbow 1-2 times per week for 4-6 weeks.   Decrease pain, increase strength, flexibility, function, and range of motion.  Modalities may include, traction, ionto, phono, stim, and dry needling prn."  PRECAUTIONS: {Therapy precautions:24002}  RED FLAGS: {PT Red Flags:29287}   WEIGHT BEARING RESTRICTIONS: {Yes ***/No:24003}  PAIN:  Are you having pain? Yes: NPRS scale: *** Pain location: *** Pain description: *** Aggravating factors: *** Relieving factors: ***  FALLS: Has patient fallen in last 6 months? {fallsyesno:27318}  LIVING ENVIRONMENT: Lives with: {OPRC lives with:25569::"lives with their family"} Lives in: {Lives in:25570} Stairs: {opstairs:27293} Has following equipment at home: {Assistive devices:23999}  PLOF: {PLOF:24004}  PATIENT GOALS: ***  NEXT MD VISIT: ***   OBJECTIVE: (All objective assessments below are from initial evaluation on: 01/26/24 unless otherwise specified.)    HAND DOMINANCE: Right ***  ADLs: Overall ADLs: States decreased ability to grab, hold household objects, pain and difficulty to open containers, perform FMS tasks (manipulate fasteners on clothing), mild to moderate bathing problems as well. ***   FUNCTIONAL OUTCOME MEASURES: Eval: Quick DASH ***% impairment today  (Higher % Score  =  More Impairment)    UPPER EXTREMITY ROM     Shoulder to Wrist AROM Right eval Left  eval  Elbow flexion    Elbow extension    Forearm supination    Forearm pronation     Wrist flexion    Wrist extension    Wrist ulnar deviation    Wrist radial deviation    Functional dart thrower's motion (F-DTM) in ulnar flexion    F-DTM in radial extension     (Blank rows = not tested)   Hand AROM Right eval Left eval  Full Fist Ability (or Gap to Distal  Palmar Crease)    Thumb Opposition  (Kapandji Scale)     Thumb MCP (0-60)    Thumb IP (0-80)    Thumb Radial Abduction Span     Thumb Palmar Abduction Span     Index MCP (0-90)     Index PIP (0-100)     Index DIP (0-70)      Long MCP (0-90)      Long PIP (0-100)      Long DIP (0-70)      Ring MCP (0-90)      Ring PIP (0-100)      Ring DIP (0-70)      Little MCP (0-90)      Little PIP (0-100)      Little DIP (0-70)      (Blank rows = not tested)   UPPER EXTREMITY MMT:    Eval: *** NT at eval due to recent and still healing injuries. Will be tested when appropriate.   MMT Right TBD Left TBD  Shoulder flexion    Shoulder abduction    Shoulder adduction    Shoulder extension    Shoulder internal rotation    Shoulder external rotation    Middle trapezius    Lower trapezius    Elbow flexion    Elbow extension    Forearm supination    Forearm pronation    Wrist flexion    Wrist extension    Wrist ulnar deviation    Wrist radial deviation    (Blank rows = not tested)  HAND FUNCTION: Eval: Observed weakness in affected *** hand.  Grip strength Right: *** lbs, Left: *** lbs   COORDINATION: Eval: Observed coordination impairments with affected *** hand. Box and Blocks Test: *** Blocks today (*** is [facility]); 9 Hole Peg Test Right: ***sec, Left: *** sec (*** sec is WFL)   SENSATION: Eval: *** Light touch intact today, though diminished around sx area    EDEMA:   Eval: *** Mildly swollen in *** hand and wrist today, ***cm circumferentially around ***  COGNITION: Eval: Overall cognitive status: WFL for evaluation today ***  OBSERVATIONS:   Eval: ***   TODAY'S TREATMENT:  Post-evaluation treatment: ***   Modalities: {OPRCMODALITIES:31717}  PATIENT EDUCATION: Education details: See tx section above for details  Person educated: Patient Education method: Engineer, structural, Teach back, Handouts  Education comprehension: States and demonstrates understanding,  Additional Education required    HOME EXERCISE PROGRAM: See tx section above for details    GOALS: Goals reviewed with patient? Yes   SHORT TERM GOALS: (STG required if POC>30 days) Target Date: ***  Pt will obtain protective, custom orthotic. Goal status: TBD/PRN,  MET ***  2.  Pt will demo/state understanding of initial HEP to improve pain levels and prerequisite motion. Goal status: INITIAL   LONG TERM GOALS: Target Date: ***  Pt will improve functional ability by decreased impairment per Quick DASH / PSFS / PRWE assessment from *** to *** or better, for better quality of  life. Goal status: INITIAL  2.  Pt will improve grip strength in *** hand from ***lbs to at least ***lbs for functional use at home and in IADLs. Goal status: INITIAL  3.  Pt will improve A/ROM in *** from *** to at least ***, to have functional motion for tasks like reach and grasp.  Goal status: INITIAL  4.  Pt will improve strength in *** from *** MMT to at least *** MMT to have increased functional ability to carry out selfcare and higher-level homecare tasks with less difficulty. Goal status: INITIAL  5.  Pt will improve coordination skills in ***, as seen by within functional limit score on *** testing to have increased functional ability to carry out fine motor tasks (fasteners, etc.) and more complex, coordinated IADLs (meal prep, sports, etc.).  Goal status: INITIAL  6.  Pt will decrease pain at worst from ***/10 to ***/10 or better to have better sleep and occupational participation in daily roles. Goal status: INITIAL   ASSESSMENT:  CLINICAL IMPRESSION: Patient is a 53 y.o. female who was seen today for occupational therapy evaluation for ***.   PERFORMANCE DEFICITS: in functional skills including {OT physical skills:25468}, cognitive skills including {OT cognitive skills:25469}, and psychosocial skills including {OT psychosocial skills:25470}.   IMPAIRMENTS: are limiting patient from  {OT performance deficits:25471}.   COMORBIDITIES: {Comorbidities:25485} that affects occupational performance. Patient will benefit from skilled OT to address above impairments and improve overall function.  MODIFICATION OR ASSISTANCE TO COMPLETE EVALUATION: {OT modification:25474}  OT OCCUPATIONAL PROFILE AND HISTORY: {OT PROFILE AND HISTORY:25484}  CLINICAL DECISION MAKING: {OT CDM:25475}  REHAB POTENTIAL: {rehabpotential:25112}  EVALUATION COMPLEXITY: {Evaluation complexity:25115}      PLAN:  OT FREQUENCY: {rehab frequency:25116}  OT DURATION: {rehab duration:25117}  PLANNED INTERVENTIONS: {OT Interventions:25467}  RECOMMENDED OTHER SERVICES: ***  CONSULTED AND AGREED WITH PLAN OF CARE: {GEX:52841}  PLAN FOR NEXT SESSION: ***   Fannie Knee, OTR/L, CHT 01/22/2024, 6:04 PM

## 2024-01-26 ENCOUNTER — Encounter: Payer: BC Managed Care – PPO | Admitting: Rehabilitative and Restorative Service Providers"

## 2024-01-26 ENCOUNTER — Other Ambulatory Visit: Payer: Self-pay | Admitting: Internal Medicine

## 2024-01-26 DIAGNOSIS — D510 Vitamin B12 deficiency anemia due to intrinsic factor deficiency: Secondary | ICD-10-CM

## 2024-02-10 ENCOUNTER — Ambulatory Visit: Payer: BC Managed Care – PPO | Admitting: Family Medicine

## 2024-02-10 ENCOUNTER — Other Ambulatory Visit: Payer: Self-pay

## 2024-02-10 VITALS — BP 128/84 | HR 71 | Ht 65.0 in | Wt 252.0 lb

## 2024-02-10 DIAGNOSIS — E119 Type 2 diabetes mellitus without complications: Secondary | ICD-10-CM | POA: Diagnosis not present

## 2024-02-10 DIAGNOSIS — G8929 Other chronic pain: Secondary | ICD-10-CM

## 2024-02-10 DIAGNOSIS — Z6841 Body Mass Index (BMI) 40.0 and over, adult: Secondary | ICD-10-CM | POA: Diagnosis not present

## 2024-02-10 DIAGNOSIS — Z01419 Encounter for gynecological examination (general) (routine) without abnormal findings: Secondary | ICD-10-CM | POA: Diagnosis not present

## 2024-02-10 DIAGNOSIS — M25522 Pain in left elbow: Secondary | ICD-10-CM | POA: Diagnosis not present

## 2024-02-10 DIAGNOSIS — M79641 Pain in right hand: Secondary | ICD-10-CM | POA: Diagnosis not present

## 2024-02-10 DIAGNOSIS — M79671 Pain in right foot: Secondary | ICD-10-CM | POA: Diagnosis not present

## 2024-02-10 LAB — HM MAMMOGRAPHY: HM Mammogram: NORMAL (ref 0–4)

## 2024-02-10 NOTE — Progress Notes (Unsigned)
   Rubin Payor, PhD, LAT, ATC acting as a scribe for Clementeen Graham, MD.  Gail Payne is a 53 y.o. female who presents to [facility] today for f/u L elbow and R hand pain. Pt was last seen by Dr. Denyse Amass on 12/30/23 and was advised to use a counterforce brace, Voltaren gel. She was also taught HEP and referred to hand therapy, but both visits were canceled.  Today, pt reports the therapist returned to her work and is scheduled to start some PT. L elbow pain had improved some. She has been wearing the strap. R hand pain has also improved. She finds the brace helpful.   Pt also notes a lump along the lateral-dorsal aspect of her R foot. Not particularly painful. Noticed last wk. No injury.  Dx imaging: 12/29/23 L elbow & R hand XR   Pertinent review of systems: No fevers or chills  Relevant historical information: Hypertension   Exam:  BP 128/84   Pulse 71   Ht 5\' 5"  (1.651 m)   Wt 252 lb (114.3 kg)   LMP 11/19/2019 Comment: take BCP per p t  SpO2 96%   BMI 41.93 kg/m  General: Well Developed, well nourished, and in no acute distress.   MSK: Right wrist normal.  Normal motion nontender. Right elbow normal.  Normal motion.  Right foot some swelling around the dorsal lateral midfoot.  Nontender normal foot motion.  Pulses cap refill sensation intact distally.    Lab and Radiology Results  Diagnostic Limited MSK Ultrasound of: Right foot dorsal lateral midfoot Mild degenerative changes and swelling around the proximal fifth metatarsal.  Peroneal brevis tendon is intact without visible tear.  Nontender to palpation with ultrasound probe. Impression: Some swelling around the fifth metatarsal proximal.  Unclear cause based on ultrasound     Assessment and Plan: 53 y.o. female with improved right wrist and elbow pain.  She is starting hand therapy/physical therapy at work now.  Could formalize this with PT/OT in outpatient setting if needed.   Could proceed with injections if needed.  For now watchful waiting.  Right midfoot swelling unclear etiology.  She is not hurting enough that further evaluation is necessary at this time.  Watchful waiting for now and consider x-ray and further evaluation if needed   PDMP not reviewed this encounter. Orders Placed This Encounter  Procedures   [place] LIMITED JOINT SPACE STRUCTURES LOW RIGHT(NO LINKED CHARGES)    Reason for Exam (SYMPTOM  OR DIAGNOSIS REQUIRED):   right foot lump    Preferred imaging location?:   [facility]   No orders of the defined types were placed in this encounter.    Discussed warning signs or symptoms. Please see discharge instructions. Patient expresses understanding.   The above documentation has been reviewed and is accurate and complete Clementeen Graham, M.D.

## 2024-02-10 NOTE — Patient Instructions (Signed)
 Thank you for coming in today.   Check back as needed  Have FABULOUS birthday (and hopefully a lovely afternoon nap)!!!!

## 2024-03-04 DIAGNOSIS — Z1231 Encounter for screening mammogram for malignant neoplasm of breast: Secondary | ICD-10-CM | POA: Diagnosis not present

## 2024-03-15 ENCOUNTER — Other Ambulatory Visit: Payer: Self-pay | Admitting: Internal Medicine

## 2024-03-15 ENCOUNTER — Ambulatory Visit: Payer: BC Managed Care – PPO | Admitting: Internal Medicine

## 2024-03-15 DIAGNOSIS — G43009 Migraine without aura, not intractable, without status migrainosus: Secondary | ICD-10-CM

## 2024-03-15 MED ORDER — UBRELVY 100 MG PO TABS: 1.0000 | ORAL_TABLET | Freq: Every day | ORAL | 0 refills | Status: DC | PRN

## 2024-03-15 NOTE — Telephone Encounter (Signed)
 Copied from CRM [id number]. Topic: Clinical - Medication Refill >> Mar 15, 2024  8:50 AM Elizebeth Brooking wrote: Most Recent Primary Care Visit:  Provider: Sherald Barge  Department: [facility]  Visit Type: OFFICE VISIT  Date: 12/29/2023  Medication: Ubrogepant (UBRELVY) 100 MG TABS  Has the patient contacted their pharmacy? Yes (Agent: If no, request that the patient contact the pharmacy for the refill. If patient does not wish to contact the pharmacy document the reason why and proceed with request.) (Agent: If yes, when and what did the pharmacy advise?)  Is this the correct pharmacy for this prescription? Yes If no, delete pharmacy and type the correct one.  This is the patient's preferred pharmacy:  [facility] - [place], [place] - [address] [place] [address] Phone: [phone] Fax: [phone]    Has the prescription been filled recently? No  Is the patient out of the medication? Yes  Has the patient been seen for an appointment in the last year OR does the patient have an upcoming appointment? Yes  Can we respond through MyChart? Yes  Agent: Please be advised that Rx refills may take up to 3 business days. We ask that you follow-up with your pharmacy.

## 2024-03-19 ENCOUNTER — Telehealth: Payer: Self-pay

## 2024-03-19 NOTE — Telephone Encounter (Signed)
 PA is needed for Ubrelvy 100mg . Please advise.

## 2024-03-21 ENCOUNTER — Other Ambulatory Visit: Payer: Self-pay | Admitting: Internal Medicine

## 2024-03-21 DIAGNOSIS — G43009 Migraine without aura, not intractable, without status migrainosus: Secondary | ICD-10-CM

## 2024-03-22 ENCOUNTER — Telehealth: Payer: Self-pay

## 2024-03-22 ENCOUNTER — Other Ambulatory Visit (HOSPITAL_COMMUNITY): Payer: Self-pay

## 2024-03-22 NOTE — Telephone Encounter (Signed)
 Pharmacy Patient Advocate Encounter  Received notification from [employer] that Prior Authorization for Gail Payne 100mg  has been APPROVED from 02/21/24 to 03/22/25   PA #/Case ID/Reference #: [id number]

## 2024-03-22 NOTE — Telephone Encounter (Signed)
 Clinical questions answered and PA submitted.

## 2024-03-22 NOTE — Telephone Encounter (Signed)
 Pharmacy Patient Advocate Encounter   Received notification from Pt Calls Messages that prior authorization for Ubrelvy 100mg  tabs is required/requested.   Insurance verification completed.   The patient is insured through [employer] .   Per test claim: PA required; PA started via CoverMyMeds. KEY [id number] . Please see clinical question(s) below that I am not finding the answer to in her chart and advise.

## 2024-03-22 NOTE — Telephone Encounter (Signed)
 NO

## 2024-03-23 NOTE — Telephone Encounter (Signed)
 Patient has been made aware. Gave a verbal understanding.

## 2024-03-24 ENCOUNTER — Other Ambulatory Visit: Payer: Self-pay | Admitting: Internal Medicine

## 2024-03-24 DIAGNOSIS — G43009 Migraine without aura, not intractable, without status migrainosus: Secondary | ICD-10-CM

## 2024-03-31 MED ORDER — UBRELVY 100 MG PO TABS: 1.0000 | ORAL_TABLET | Freq: Every day | ORAL | 0 refills | Status: DC | PRN

## 2024-04-05 ENCOUNTER — Encounter: Payer: Self-pay | Admitting: Internal Medicine

## 2024-04-05 ENCOUNTER — Ambulatory Visit: Admitting: Internal Medicine

## 2024-04-05 VITALS — BP 122/66 | HR 70 | Temp 98.5°F | Resp 16 | Ht 65.0 in | Wt 246.0 lb

## 2024-04-05 DIAGNOSIS — E118 Type 2 diabetes mellitus with unspecified complications: Secondary | ICD-10-CM

## 2024-04-05 DIAGNOSIS — D510 Vitamin B12 deficiency anemia due to intrinsic factor deficiency: Secondary | ICD-10-CM | POA: Diagnosis not present

## 2024-04-05 DIAGNOSIS — I10 Essential (primary) hypertension: Secondary | ICD-10-CM | POA: Diagnosis not present

## 2024-04-05 DIAGNOSIS — E538 Deficiency of other specified B group vitamins: Secondary | ICD-10-CM

## 2024-04-05 DIAGNOSIS — Z0001 Encounter for general adult medical examination with abnormal findings: Secondary | ICD-10-CM

## 2024-04-05 DIAGNOSIS — D500 Iron deficiency anemia secondary to blood loss (chronic): Secondary | ICD-10-CM

## 2024-04-05 DIAGNOSIS — F5104 Psychophysiologic insomnia: Secondary | ICD-10-CM

## 2024-04-05 DIAGNOSIS — Z6841 Body Mass Index (BMI) 40.0 and over, adult: Secondary | ICD-10-CM

## 2024-04-05 MED ORDER — ESZOPICLONE 3 MG PO TABS: 3.0000 mg | ORAL_TABLET | Freq: Every day | ORAL | 1 refills | Status: AC

## 2024-04-05 MED ORDER — FOLIC ACID 1 MG PO TABS: 1.0000 mg | ORAL_TABLET | Freq: Every day | ORAL | 1 refills | Status: DC

## 2024-04-05 NOTE — Progress Notes (Unsigned)
 Subjective:  [name], female    DOB: 1971-11-23  Age: 53 y.o. MRN: [id number]  CC: Annual Exam   HPI Gail Payne presents for a CPX and f/up----  Discussed the use of AI scribe software for clinical note transcription with the patient, who gave verbal consent to proceed.  History of Present Illness   Gail Payne is a 53 year old female with type 2 diabetes who presents with fatigue and insomnia.  She experiences persistent fatigue, feeling exhausted throughout the day despite getting what she believes is adequate sleep. Her insomnia is characterized by waking up after about two hours of sleep and having difficulty staying asleep, although she can initially fall asleep without issue. No dizziness, lightheadedness, snoring, or sleep apnea are reported.  She has a history of a thyroid  nodule discovered during a routine check-up, with a biopsy yielding satisfactory results. She denies any pain associated with the thyroid  nodule and reports no other thyroid -related symptoms such as weight gain or constipation. She is concerned about a potential link between her thyroid  nodule and Mounjaro , a medication she is taking for diabetes.  She is currently being treated for type 2 diabetes with Mounjaro  and is scheduled for lab work, including A1c, later this week. She is also taking B12 shots at home.       Outpatient Medications Prior to Visit  Medication Sig Dispense Refill   albuterol  (VENTOLIN  HFA) 108 (90 Base) MCG/ACT inhaler Inhale 2 puffs into the lungs every 6 (six) hours as needed for wheezing or shortness of breath. 8 g 0   aspirin 81 MG chewable tablet Chew by mouth daily.     Cholecalciferol (VITAMIN D ) 50 MCG (2000 UT) CAPS Take 1 capsule (2,000 Units total) by mouth daily. 30 capsule 5   cyanocobalamin  (VITAMIN B12) 1000 MCG/ML injection Inject 1 mL (1,000 mcg total) into the muscle every 30 (thirty) days. 3 mL 3   diclofenac  Sodium (VOLTAREN ) 1 % GEL  Apply 2 g topically 4 (four) times daily. 100 g 0   Docusate Calcium  (STOOL SOFTENER PO) Take by mouth daily. With Iron daily     FEROSUL 325 (65 Fe) MG tablet TAKE 1 TABLET TWICE A DAY WITH MEALS 180 tablet 0   fluticasone  (FLONASE ) 50 MCG/ACT nasal spray SPRAY 2 SPRAYS INTO EACH NOSTRIL EVERY DAY 48 mL 1   MOUNJARO  5 MG/0.5ML Pen INJECT 5MG  SUBCUTANEOUS ONCE A WEEK 30 DAYS 6 mL 0   naproxen  (NAPROSYN ) 500 MG tablet Take 1 tablet (500 mg total) by mouth 2 (two) times daily. (Patient taking differently: Take 500 mg by mouth as needed.) 30 tablet 0   omeprazole  (PRILOSEC) 40 MG capsule TAKE 1 CAPSULE (40 MG TOTAL) BY MOUTH DAILY. 90 capsule 0   rosuvastatin  (CRESTOR ) 10 MG tablet Take 1 tablet (10 mg total) by mouth daily. 90 tablet 1   SYRINGE-NEEDLE, DISP, 3 ML (B-D SYRINGE/NEEDLE 3CC/25GX5/8) 25G X 5/8" 3 ML MISC USE TO INJECT B-12 MONTHLY PER MD FOLLOW-UP: RETURN IN ABOUT 6 MONTHS (AROUND 08/13/2023). 3 each 0   Ubrogepant  (UBRELVY ) 100 MG TABS Take 1 tablet (100 mg total) by mouth daily as needed. 8 tablet 0   folic acid  (FOLVITE ) 1 MG tablet TAKE 1 TABLET DAILY 90 tablet 3   No facility-administered medications prior to visit.    ROS Review of Systems  Constitutional:  Positive for fatigue. Negative for appetite change, chills, diaphoresis and unexpected weight change.  HENT: Negative.  Eyes: Negative.  Negative for visual disturbance.  Respiratory: Negative.  Negative for cough, chest tightness, shortness of breath and wheezing.   Cardiovascular:  Negative for chest pain, palpitations and leg swelling.  Gastrointestinal:  Negative for abdominal pain, constipation, diarrhea, nausea and vomiting.  Endocrine: Negative.  Negative for polyuria.  Genitourinary:  Negative for difficulty urinating and dysuria.  Musculoskeletal: Negative.  Negative for arthralgias, back pain, myalgias and neck pain.  Skin: Negative.   Neurological: Negative.  Negative for dizziness and weakness.   Hematological:  Negative for adenopathy. Does not bruise/bleed easily.  Psychiatric/Behavioral:  Positive for sleep disturbance. Negative for behavioral problems, confusion, decreased concentration and dysphoric mood. The patient is not nervous/anxious.     Objective:  BP 122/66 (BP Location: Left Arm, Patient Position: Sitting, Cuff Size: Large)   Pulse 70   Temp 98.5 F (36.9 C) (Temporal)   Resp 16   Ht 5\' 5"  (1.651 m)   Wt 246 lb (111.6 kg)   LMP 11/19/2019 Comment: take BCP per p t  SpO2 99%   BMI 40.94 kg/m   BP Readings from Last 3 Encounters:  04/05/24 122/66  02/10/24 128/84  12/30/23 130/86    Wt Readings from Last 3 Encounters:  04/05/24 246 lb (111.6 kg)  02/10/24 252 lb (114.3 kg)  12/30/23 253 lb (114.8 kg)    Physical Exam Vitals reviewed.  Constitutional:      Appearance: Normal appearance.  HENT:     Nose: Nose normal.     Mouth/Throat:     Mouth: Mucous membranes are moist.  Eyes:     General: No scleral icterus.    Conjunctiva/sclera: Conjunctivae normal.  Cardiovascular:     Rate and Rhythm: Normal rate and regular rhythm.     Heart sounds: No murmur heard.    No friction rub. No gallop.  Pulmonary:     Effort: Pulmonary effort is normal.     Breath sounds: No stridor. No wheezing, rhonchi or rales.  Abdominal:     General: Abdomen is protuberant. Bowel sounds are normal. There is no distension.     Palpations: Abdomen is soft. There is no hepatomegaly, splenomegaly or mass.     Tenderness: There is no abdominal tenderness.  Musculoskeletal:        General: Normal range of motion.     Cervical back: Neck supple.     Right lower leg: No edema.     Left lower leg: No edema.  Lymphadenopathy:     Cervical: No cervical adenopathy.  Skin:    General: Skin is warm and dry.  Neurological:     General: No focal deficit present.     Mental Status: She is alert.  Psychiatric:        Mood and Affect: Mood normal.        Behavior: Behavior  normal.     Lab Results  Component Value Date   WBC 9.6 09/15/2023   HGB 12.3 09/15/2023   HCT 39.3 09/15/2023   PLT 326.0 09/15/2023   GLUCOSE 92 09/15/2023   CHOL 100 09/15/2023   TRIG 74.0 09/15/2023   HDL 40.10 09/15/2023   LDLCALC 45 09/15/2023   ALT 18 09/15/2023   AST 18 09/15/2023   NA 140 09/15/2023   K 4.0 09/15/2023   CL 106 09/15/2023   CREATININE 0.71 09/15/2023   BUN 16 09/15/2023   CO2 29 09/15/2023   TSH 0.36 02/10/2023   HGBA1C 5.2 09/15/2023   MICROALBUR 2.0 (H)  02/10/2023    US  FNA BX THYROID  1ST LESION AFIRMA Result Date: 11/11/2023 INDICATION: Indeterminate thyroid  nodules EXAM: ULTRASOUND GUIDED FINE NEEDLE ASPIRATION OF INDETERMINATE THYROID  NODULE COMPARISON:  None Available. MEDICATIONS: None COMPLICATIONS: None immediate. TECHNIQUE: Informed written consent was obtained from the patient after a discussion of the risks, benefits and alternatives to treatment. Questions regarding the procedure were encouraged and answered. A timeout was performed prior to the initiation of the procedure. Pre-procedural ultrasound scanning demonstrated unchanged size and appearance of the indeterminate nodules within the right mid and left upper glands The procedure was planned. The neck was prepped in the usual sterile fashion, and a sterile drape was applied covering the operative field. A timeout was performed prior to the initiation of the procedure. Local anesthesia was provided with 1% lidocaine. Under direct ultrasound guidance, 5 FNA biopsies were performed of the 4.5 cm TI-RADS category 3 nodule in the right mid gland with a 25 gauge needle. Two samples were reserved for future Afirma testing. Multiple ultrasound images were saved for procedural documentation purposes. The samples were prepared and submitted to pathology. Under direct ultrasound guidance, 5 FNA biopsies were performed of the 1.7 cm TI-RADS category 3 nodule in the left superior gland with a 25 gauge  needle. Two samples were reserved for future Afirma testing. Multiple ultrasound images were saved for procedural documentation purposes. The samples were prepared and submitted to pathology. Limited post procedural scanning was negative for hematoma or additional complication. Dressings were placed. The patient tolerated the above procedures procedure well without immediate postprocedural complication. FINDINGS: Nodule reference number based on prior diagnostic ultrasound: 2 Maximum size: 4.5 cm Location: Right; Mid ACR TI-RADS risk category: TR3 (3 points) Reason for biopsy: meets ACR TI-RADS criteria _________________________________________________________ Nodule reference number based on prior diagnostic ultrasound: 3 Maximum size: 1.7 cm Location: Left; Superior ACR TI-RADS risk category: TR3 (3 points) Reason for biopsy: patient/referrer request Ultrasound imaging confirms appropriate placement of the needles within the thyroid  nodule. IMPRESSION: 1. Technically successful ultrasound guided fine needle aspiration of nodule # 2 in the right mid gland. 2. Technically successful ultrasound guided fine needle aspiration of nodule # 3 in the left superior gland. Electronically Signed   By: Fernando Hoyer M.D.   On: 11/11/2023 16:28   US  FNA BX THYROID  1ST LESION AFIRMA Result Date: 11/11/2023 INDICATION: Indeterminate thyroid  nodules EXAM: ULTRASOUND GUIDED FINE NEEDLE ASPIRATION OF INDETERMINATE THYROID  NODULE COMPARISON:  None Available. MEDICATIONS: None COMPLICATIONS: None immediate. TECHNIQUE: Informed written consent was obtained from the patient after a discussion of the risks, benefits and alternatives to treatment. Questions regarding the procedure were encouraged and answered. A timeout was performed prior to the initiation of the procedure. Pre-procedural ultrasound scanning demonstrated unchanged size and appearance of the indeterminate nodules within the right mid and left upper glands The  procedure was planned. The neck was prepped in the usual sterile fashion, and a sterile drape was applied covering the operative field. A timeout was performed prior to the initiation of the procedure. Local anesthesia was provided with 1% lidocaine. Under direct ultrasound guidance, 5 FNA biopsies were performed of the 4.5 cm TI-RADS category 3 nodule in the right mid gland with a 25 gauge needle. Two samples were reserved for future Afirma testing. Multiple ultrasound images were saved for procedural documentation purposes. The samples were prepared and submitted to pathology. Under direct ultrasound guidance, 5 FNA biopsies were performed of the 1.7 cm TI-RADS category 3 nodule in the left superior gland with  a 25 gauge needle. Two samples were reserved for future Afirma testing. Multiple ultrasound images were saved for procedural documentation purposes. The samples were prepared and submitted to pathology. Limited post procedural scanning was negative for hematoma or additional complication. Dressings were placed. The patient tolerated the above procedures procedure well without immediate postprocedural complication. FINDINGS: Nodule reference number based on prior diagnostic ultrasound: 2 Maximum size: 4.5 cm Location: Right; Mid ACR TI-RADS risk category: TR3 (3 points) Reason for biopsy: meets ACR TI-RADS criteria _________________________________________________________ Nodule reference number based on prior diagnostic ultrasound: 3 Maximum size: 1.7 cm Location: Left; Superior ACR TI-RADS risk category: TR3 (3 points) Reason for biopsy: patient/referrer request Ultrasound imaging confirms appropriate placement of the needles within the thyroid  nodule. IMPRESSION: 1. Technically successful ultrasound guided fine needle aspiration of nodule # 2 in the right mid gland. 2. Technically successful ultrasound guided fine needle aspiration of nodule # 3 in the left superior gland. Electronically Signed   By: Fernando Hoyer M.D.   On: 11/11/2023 16:28    Assessment & Plan:  Morbid obesity with BMI of 40.0-44.9, adult (HCC) -     TSH; Future -     Hemoglobin A1c; Future  Vitamin B12 deficiency anemia due to intrinsic factor deficiency -     CBC with Differential/Platelet; Future  Folate deficiency -     Folic Acid ; Take 1 tablet (1 mg total) by mouth daily.  Dispense: 90 tablet; Refill: 1 -     CBC with Differential/Platelet; Future  Iron deficiency anemia due to chronic blood loss -     CBC with Differential/Platelet; Future  Primary hypertension- BP is well controlled. -     Basic metabolic panel with GFR; Future -     Urinalysis, Routine w reflex microscopic; Future  Type II diabetes mellitus with manifestations (HCC)- Will monitor her A1C. -     Hemoglobin A1c; Future -     Microalbumin / creatinine urine ratio; Future -     HM Diabetes Foot Exam  Encounter for general adult medical examination with abnormal findings- Exam completed, labs ordered, vaccines reviewed, cancer screenings are UTD, pt ed material was given.   Psychophysiological insomnia -     Eszopiclone ; Take 1 tablet (3 mg total) by mouth at bedtime. Take immediately before bedtime  Dispense: 90 tablet; Refill: 1     Follow-up: No follow-ups on file.  Jaimie Crouch, MD

## 2024-04-06 ENCOUNTER — Ambulatory Visit: Admitting: Internal Medicine

## 2024-04-07 ENCOUNTER — Encounter: Payer: Self-pay | Admitting: Internal Medicine

## 2024-04-08 DIAGNOSIS — E1165 Type 2 diabetes mellitus with hyperglycemia: Secondary | ICD-10-CM | POA: Diagnosis not present

## 2024-04-08 DIAGNOSIS — E7800 Pure hypercholesterolemia, unspecified: Secondary | ICD-10-CM | POA: Diagnosis not present

## 2024-04-15 DIAGNOSIS — E1165 Type 2 diabetes mellitus with hyperglycemia: Secondary | ICD-10-CM | POA: Diagnosis not present

## 2024-04-15 DIAGNOSIS — E7800 Pure hypercholesterolemia, unspecified: Secondary | ICD-10-CM | POA: Diagnosis not present

## 2024-04-15 DIAGNOSIS — I10 Essential (primary) hypertension: Secondary | ICD-10-CM | POA: Diagnosis not present

## 2024-04-21 ENCOUNTER — Other Ambulatory Visit (HOSPITAL_COMMUNITY): Payer: Self-pay | Admitting: Endocrinology

## 2024-04-21 DIAGNOSIS — E0590 Thyrotoxicosis, unspecified without thyrotoxic crisis or storm: Secondary | ICD-10-CM

## 2024-04-29 ENCOUNTER — Encounter (HOSPITAL_COMMUNITY)
Admission: RE | Admit: 2024-04-29 | Discharge: 2024-04-29 | Disposition: A | Discharge status: Home / Self Care | Source: Ambulatory Visit | Attending: Endocrinology | Admitting: Endocrinology

## 2024-04-29 DIAGNOSIS — E0590 Thyrotoxicosis, unspecified without thyrotoxic crisis or storm: Secondary | ICD-10-CM | POA: Insufficient documentation

## 2024-04-29 MED ORDER — SODIUM IODIDE I-123 7.4 MBQ CAPS
385.0000 | ORAL_CAPSULE | Freq: Once | ORAL | Status: AC
  Administered 2024-04-29: 385 via ORAL

## 2024-04-30 ENCOUNTER — Encounter (HOSPITAL_COMMUNITY)
Admission: RE | Admit: 2024-04-30 | Discharge: 2024-04-30 | Disposition: A | Discharge status: Home / Self Care | Source: Ambulatory Visit | Attending: Endocrinology | Admitting: Endocrinology

## 2024-04-30 DIAGNOSIS — E0590 Thyrotoxicosis, unspecified without thyrotoxic crisis or storm: Secondary | ICD-10-CM | POA: Diagnosis not present

## 2024-04-30 DIAGNOSIS — E049 Nontoxic goiter, unspecified: Secondary | ICD-10-CM | POA: Diagnosis not present

## 2024-05-19 ENCOUNTER — Other Ambulatory Visit: Payer: Self-pay | Admitting: Internal Medicine

## 2024-05-19 DIAGNOSIS — E785 Hyperlipidemia, unspecified: Secondary | ICD-10-CM

## 2024-05-19 MED ORDER — ROSUVASTATIN CALCIUM 10 MG PO TABS: 10.0000 mg | ORAL_TABLET | Freq: Every day | ORAL | 1 refills | Status: DC

## 2024-05-21 ENCOUNTER — Other Ambulatory Visit: Payer: Self-pay | Admitting: Internal Medicine

## 2024-05-21 DIAGNOSIS — D510 Vitamin B12 deficiency anemia due to intrinsic factor deficiency: Secondary | ICD-10-CM

## 2024-07-05 DIAGNOSIS — E0590 Thyrotoxicosis, unspecified without thyrotoxic crisis or storm: Secondary | ICD-10-CM | POA: Diagnosis not present

## 2024-07-08 DIAGNOSIS — E7800 Pure hypercholesterolemia, unspecified: Secondary | ICD-10-CM | POA: Diagnosis not present

## 2024-07-08 DIAGNOSIS — I10 Essential (primary) hypertension: Secondary | ICD-10-CM | POA: Diagnosis not present

## 2024-07-08 DIAGNOSIS — E1165 Type 2 diabetes mellitus with hyperglycemia: Secondary | ICD-10-CM | POA: Diagnosis not present

## 2024-07-14 ENCOUNTER — Other Ambulatory Visit (HOSPITAL_COMMUNITY): Payer: Self-pay | Admitting: Endocrinology

## 2024-07-14 DIAGNOSIS — E0590 Thyrotoxicosis, unspecified without thyrotoxic crisis or storm: Secondary | ICD-10-CM

## 2024-07-14 NOTE — Written Directive (Addendum)
 I-131 WHOLE THYROID  THERAPY (NON-CANCER)    RADIOPHARMACEUTICAL:   Iodine-131 Capsule    PRESCRIBED DOSE FOR ADMINISTRATION: 30 mCi   ROUTE OFADMINISTRATION: PO   DIAGNOSIS:  Subclinical hyperthyroidism    REFERRING PHYSICIAN: Bindubal Balan    TSH:   1.240       Lab Results  Component Value Date   TSH 0.36 02/10/2023   TSH 0.50 03/04/2022   TSH 1.17 10/23/2020     PRIOR I-131 THERAPY (Date and Dose):   PRIOR RADIOLOGY EXAMS (Results and Date): NM THYROID  MULT UPTAKE W/IMAGING Result Date: 05/03/2024 CLINICAL DATA:  Subclinical hyperthyroidism, history of multinodular goiter EXAM: THYROID  SCAN AND UPTAKE - 4 AND 24 HOURS TECHNIQUE: Following oral administration of I-123 capsule, anterior planar imaging was acquired at 24 hours. Thyroid  uptake was calculated with a thyroid  probe at 4-6 hours and 24 hours. RADIOPHARMACEUTICALS:  385 uCi I-123 sodium iodide p.o. COMPARISON:  Ultrasound 10/14/2023 FINDINGS: Planar imaging of the thyroid  demonstrates thyromegaly with diffuse heterogeneous radiotracer uptake consistent with known history of multinodular goiter. 4 hour I-123 uptake = 16.4% (normal 5-20%) 24 hour I-123 uptake = 41.2% (normal 10-30%) IMPRESSION: 1. Enlarged thyroid  with heterogeneous radiotracer uptake consistent with known history of multinodular goiter. 2. Elevated 24 hour iodine uptake. Electronically Signed   By: Ozell Daring M.D.   On: 05/03/2024 22:13   US  THYROID  Result Date: 10/19/2023 CLINICAL DATA:  Palpable abnormality.  Thyromegaly EXAM: THYROID  ULTRASOUND TECHNIQUE: Ultrasound examination of the thyroid  gland and adjacent soft tissues was performed. COMPARISON:  None Available. FINDINGS: Parenchymal Echotexture: Moderately heterogenous Isthmus: 0.4 cm Right lobe: 7.2 x 3.1 x 3.7 cm Left lobe: 6.6 x 2.2 x 2.5 cm _________________________________________________________ Estimated total number of nodules >/= 1 cm: 6-10 Number of spongiform nodules >/=  2 cm  not described below (TR1): 0 Number of mixed cystic and solid nodules >/= 1.5 cm not described below (TR2): 0 _________________________________________________________ Diffusely enlarged, heterogeneous and multinodular thyroid  gland. The thyroid  nodules are all similar in appearance in appearance (predominantly solid, isoechoic or spongiform). Additionally, there are several vague areas of pseudo nodularity. Nodule # 1: Ill-defined isoechoic solid nodule in the right superior gland measures 2.0 x 2.0 x 1.1 cm. Findings are consistent with TI-RADS category 3. *Given size (>/= 1.5 - 2.4 cm) and appearance, a follow-up ultrasound in 1 year should be considered based on TI-RADS criteria. Nodule # 2: Large predominantly solid isoechoic nodule in the right mid and lower gland measures 4.5 x 3.3 x 2.4 cm. Findings are consistent with TI-RADS category 3. **Given size (>/= 2.5 cm) and appearance, fine needle aspiration of this mildly suspicious nodule should be considered based on TI-RADS criteria. Nodule # 3: Solid isoechoic nodule in the left superior gland measures 1.7 x 1.3 x 1.2 cm. Findings are consistent with TI-RADS category 3. *Given size (>/= 1.5 - 2.4 cm) and appearance, a follow-up ultrasound in 1 year should be considered based on TI-RADS criteria. Nodule # 4: 1.9 cm spongiform nodule in the left mid gland. This nodule does NOT meet TI-RADS criteria for biopsy or dedicated follow-up. Nodule # 5: Spongiform nodule in the left mid gland measures up to 2.0 cm. This nodule does NOT meet TI-RADS criteria for biopsy or dedicated follow-up. Nodule # 6: Mixed cystic and solid nodule in the left mid gland measures up to 1.6 cm. The solid component is isoechoic. This nodule does NOT meet TI-RADS criteria for biopsy or dedicated follow-up. IMPRESSION: 1. Diffusely enlarged, heterogeneous and multinodular thyroid   gland most consistent with multinodular goiter. 2. Nodule # 2 is a 4.5 cm TI-RADS category 3 nodule in the right  mid/lower gland and meets criteria to consider fine-needle aspiration biopsy. Biopsy is recommended. 3. Nodule # 1 and # 3 both meet criteria for imaging surveillance. Recommend follow-up ultrasound in 1 year. The above is in keeping with the ACR TI-RADS recommendations - Isiaha [employer] Am Coll Radiol 2017;14:587-595. Electronically Signed   By: Wilkie Lent M.D.   On: 10/19/2023 14:42      ADDITIONAL PHYSICIAN COMMENTS/NOTES Toxic multinodular goiter  AUTHORIZED USER SIGNATURE & TIME STAMP: Norleen GORMAN Boxer, MD   07/19/24    4:08 PM

## 2024-07-29 ENCOUNTER — Encounter (HOSPITAL_COMMUNITY)
Admission: RE | Admit: 2024-07-29 | Discharge: 2024-07-29 | Disposition: A | Discharge status: Home / Self Care | Source: Ambulatory Visit | Attending: Endocrinology | Admitting: Endocrinology

## 2024-07-29 DIAGNOSIS — E0590 Thyrotoxicosis, unspecified without thyrotoxic crisis or storm: Secondary | ICD-10-CM | POA: Insufficient documentation

## 2024-07-29 DIAGNOSIS — E042 Nontoxic multinodular goiter: Secondary | ICD-10-CM | POA: Diagnosis not present

## 2024-07-29 MED ORDER — SODIUM IODIDE I 131 CAPSULE
29.7000 | Freq: Once | INTRAVENOUS | Status: AC
Start: 2024-07-29 — End: 2024-07-29
  Administered 2024-07-29: 29.7 via ORAL

## 2024-08-03 DIAGNOSIS — E0590 Thyrotoxicosis, unspecified without thyrotoxic crisis or storm: Secondary | ICD-10-CM | POA: Diagnosis not present

## 2024-08-09 ENCOUNTER — Other Ambulatory Visit: Payer: Self-pay | Admitting: Internal Medicine

## 2024-08-09 DIAGNOSIS — G43009 Migraine without aura, not intractable, without status migrainosus: Secondary | ICD-10-CM

## 2024-08-22 ENCOUNTER — Other Ambulatory Visit: Payer: Self-pay | Admitting: Internal Medicine

## 2024-08-22 DIAGNOSIS — K219 Gastro-esophageal reflux disease without esophagitis: Secondary | ICD-10-CM

## 2024-09-13 ENCOUNTER — Other Ambulatory Visit: Payer: Self-pay | Admitting: Internal Medicine

## 2024-09-13 DIAGNOSIS — D500 Iron deficiency anemia secondary to blood loss (chronic): Secondary | ICD-10-CM

## 2024-09-13 DIAGNOSIS — D510 Vitamin B12 deficiency anemia due to intrinsic factor deficiency: Secondary | ICD-10-CM

## 2024-09-13 NOTE — Telephone Encounter (Unsigned)
 Copied from CRM [id number]. Topic: Clinical - Medication Refill >> Sep 13, 2024  1:28 PM Leah C wrote: Medication: cyanocobalamin  (VITAMIN B12) 1000 MCG/ML injection; FEROSUL 325 (65 Fe) MG tablet  Has the patient contacted their pharmacy? Yes but pharmacy stated they didn't have it. Patient is asking if the yanocobalamin (VITAMIN B12) 1000 MCG/ML injection can be sent to [facility].  (Agent: If no, request that the patient contact the pharmacy for the refill. If patient does not wish to contact the pharmacy document the reason why and proceed with request.) (Agent: If yes, when and what did the pharmacy advise?)  This is the patient's preferred pharmacy:  [facility] - [place], [place] - [address] [place] [address] Phone: [phone] Fax: [phone]   Is this the correct pharmacy for this prescription? Yes If no, delete pharmacy and type the correct one.   Has the prescription been filled recently? No  Is the patient out of the medication? Out of B12 and iron for this week  Has the patient been seen for an appointment in the last year OR does the patient have an upcoming appointment? Yes  Can we respond through MyChart? Yes  Agent: Please be advised that Rx refills may take up to 3 business days. We ask that you follow-up with your pharmacy.

## 2024-09-15 MED ORDER — CYANOCOBALAMIN 1000 MCG/ML IJ SOLN: 1000.0000 ug | INTRAMUSCULAR | 3 refills | Status: DC

## 2024-09-15 MED ORDER — FERROUS SULFATE 325 (65 FE) MG PO TABS
325.0000 mg | ORAL_TABLET | Freq: Two times a day (BID) | ORAL | 0 refills | Status: AC
Start: 2024-09-15 — End: ?

## 2024-09-27 ENCOUNTER — Encounter: Payer: Self-pay | Admitting: Internal Medicine

## 2024-10-01 ENCOUNTER — Other Ambulatory Visit: Payer: Self-pay | Admitting: Internal Medicine

## 2024-10-01 DIAGNOSIS — D510 Vitamin B12 deficiency anemia due to intrinsic factor deficiency: Secondary | ICD-10-CM

## 2024-10-04 ENCOUNTER — Other Ambulatory Visit: Payer: Self-pay | Admitting: Internal Medicine

## 2024-10-04 DIAGNOSIS — E538 Deficiency of other specified B group vitamins: Secondary | ICD-10-CM

## 2024-10-07 DIAGNOSIS — E7800 Pure hypercholesterolemia, unspecified: Secondary | ICD-10-CM | POA: Diagnosis not present

## 2024-10-14 DIAGNOSIS — E7800 Pure hypercholesterolemia, unspecified: Secondary | ICD-10-CM | POA: Diagnosis not present

## 2024-10-14 DIAGNOSIS — I10 Essential (primary) hypertension: Secondary | ICD-10-CM | POA: Diagnosis not present

## 2024-10-14 DIAGNOSIS — E1165 Type 2 diabetes mellitus with hyperglycemia: Secondary | ICD-10-CM | POA: Diagnosis not present

## 2024-10-19 DIAGNOSIS — R21 Rash and other nonspecific skin eruption: Secondary | ICD-10-CM | POA: Diagnosis not present

## 2024-10-19 DIAGNOSIS — E890 Postprocedural hypothyroidism: Secondary | ICD-10-CM | POA: Diagnosis not present

## 2024-11-25 DIAGNOSIS — E890 Postprocedural hypothyroidism: Secondary | ICD-10-CM | POA: Diagnosis not present

## 2024-12-01 ENCOUNTER — Other Ambulatory Visit: Payer: Self-pay | Admitting: Internal Medicine

## 2024-12-01 DIAGNOSIS — E785 Hyperlipidemia, unspecified: Secondary | ICD-10-CM

## 2025-01-04 ENCOUNTER — Other Ambulatory Visit: Payer: Self-pay | Admitting: Internal Medicine

## 2025-01-04 DIAGNOSIS — K219 Gastro-esophageal reflux disease without esophagitis: Secondary | ICD-10-CM
